# Patient Record
Sex: Female | Born: 1945
Health system: Southern US, Community
[De-identification: ages and names within clinical notes are randomized; demographics above are authoritative.]

## PROBLEM LIST (undated history)

## (undated) DIAGNOSIS — F329 Major depressive disorder, single episode, unspecified: Secondary | ICD-10-CM

## (undated) DIAGNOSIS — F32A Depression, unspecified: Secondary | ICD-10-CM

## (undated) DIAGNOSIS — F419 Anxiety disorder, unspecified: Secondary | ICD-10-CM

## (undated) HISTORY — DX: Anxiety disorder, unspecified: F41.9

## (undated) HISTORY — DX: Depression, unspecified: F32.A

## (undated) HISTORY — DX: Major depressive disorder, single episode, unspecified: F32.9

---

## 1971-09-15 HISTORY — PX: ABDOMINAL HYSTERECTOMY: SHX81

## 2005-08-01 ENCOUNTER — Inpatient Hospital Stay: Payer: Self-pay | Admitting: Internal Medicine

## 2005-08-04 ENCOUNTER — Other Ambulatory Visit: Payer: Self-pay

## 2005-08-19 ENCOUNTER — Ambulatory Visit: Payer: Self-pay | Admitting: Internal Medicine

## 2005-10-16 ENCOUNTER — Ambulatory Visit: Payer: Self-pay | Admitting: Internal Medicine

## 2006-03-03 LAB — HM DEXA SCAN

## 2007-09-21 ENCOUNTER — Ambulatory Visit: Payer: Self-pay | Admitting: Family Medicine

## 2008-03-22 ENCOUNTER — Ambulatory Visit: Payer: Self-pay | Admitting: Family Medicine

## 2009-09-07 IMAGING — CR DG CHEST 2V
1 series · 2 of 2 positions shown · non-contrast
Comparison: none

REASON FOR EXAM: PNEUMONIA
COMMENTS:

PROCEDURE:     KDR - KDXR CHEST PA (OR AP) AND LAT  - September 21, 2007 [DATE]
RESULT:     The lung fields are clear. The heart, mediastinal and osseous
structures show no acute changes.

[Series 1: view not recorded · 0.17mm/px · 2 of 2 slices shown]
[im 1/2]
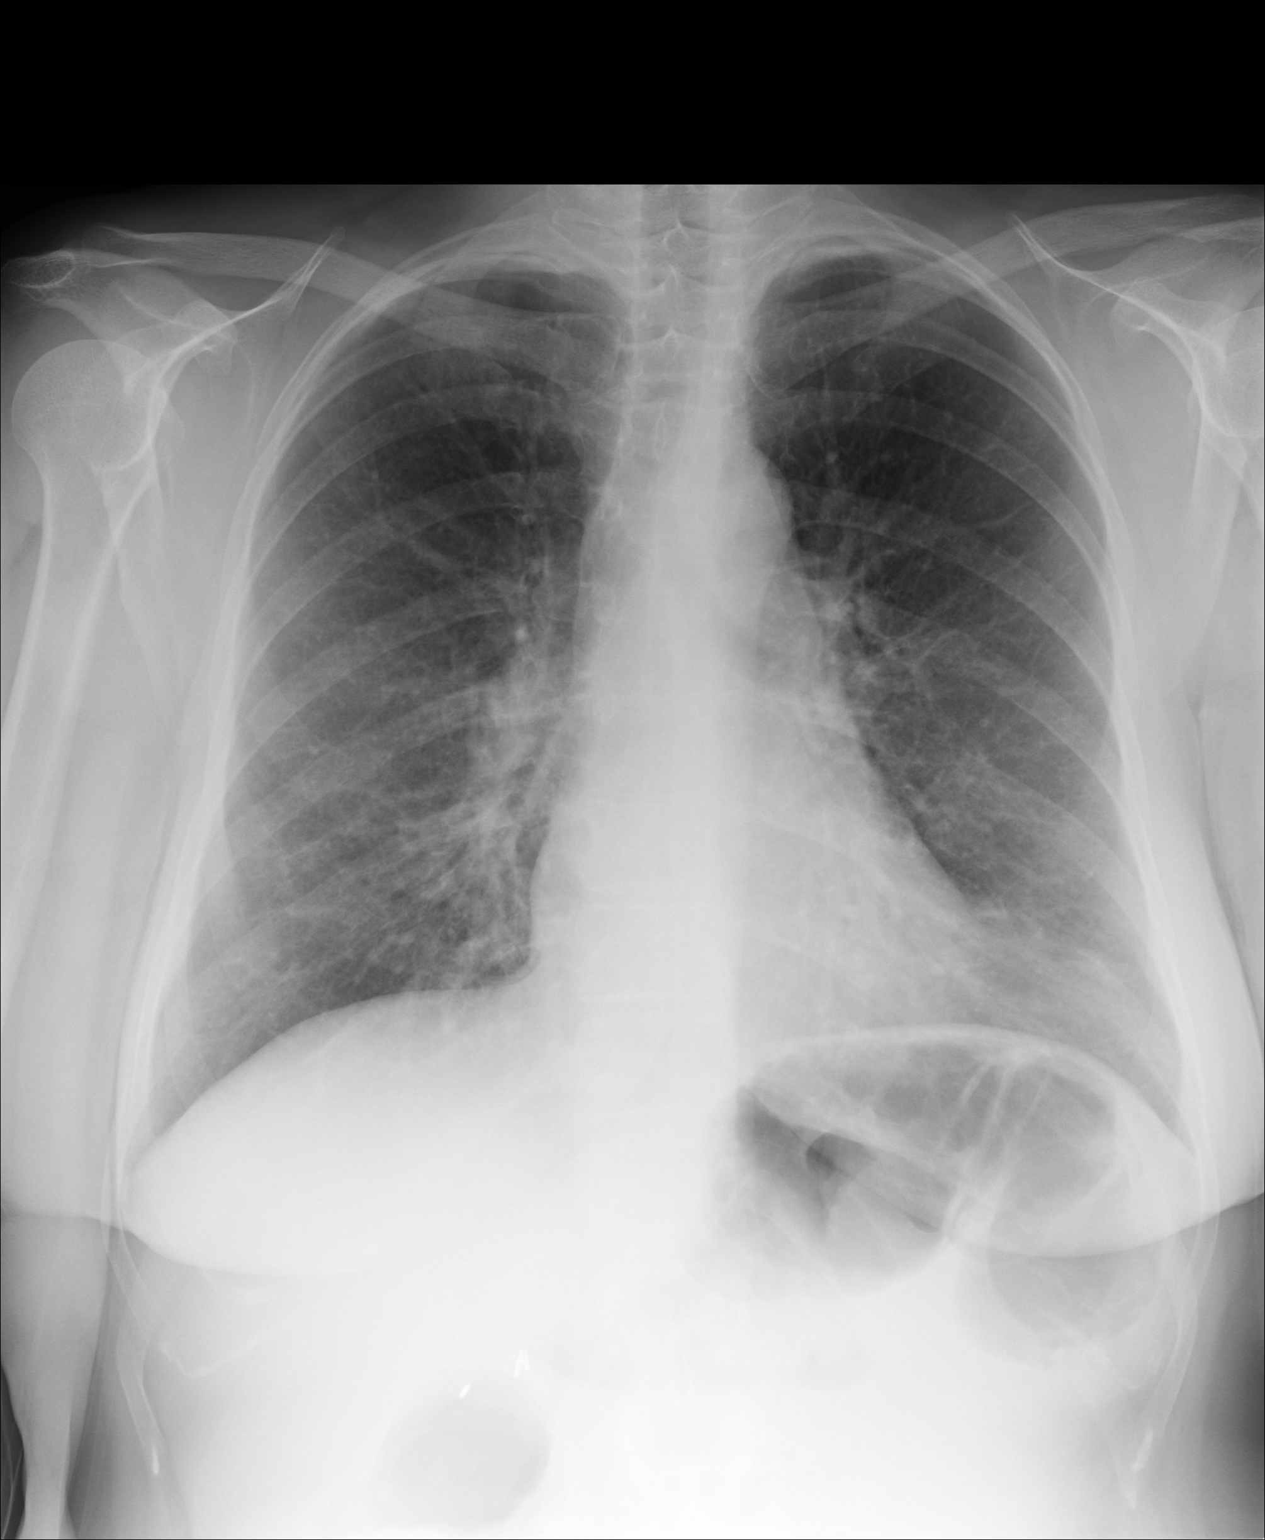
[im 2/2]
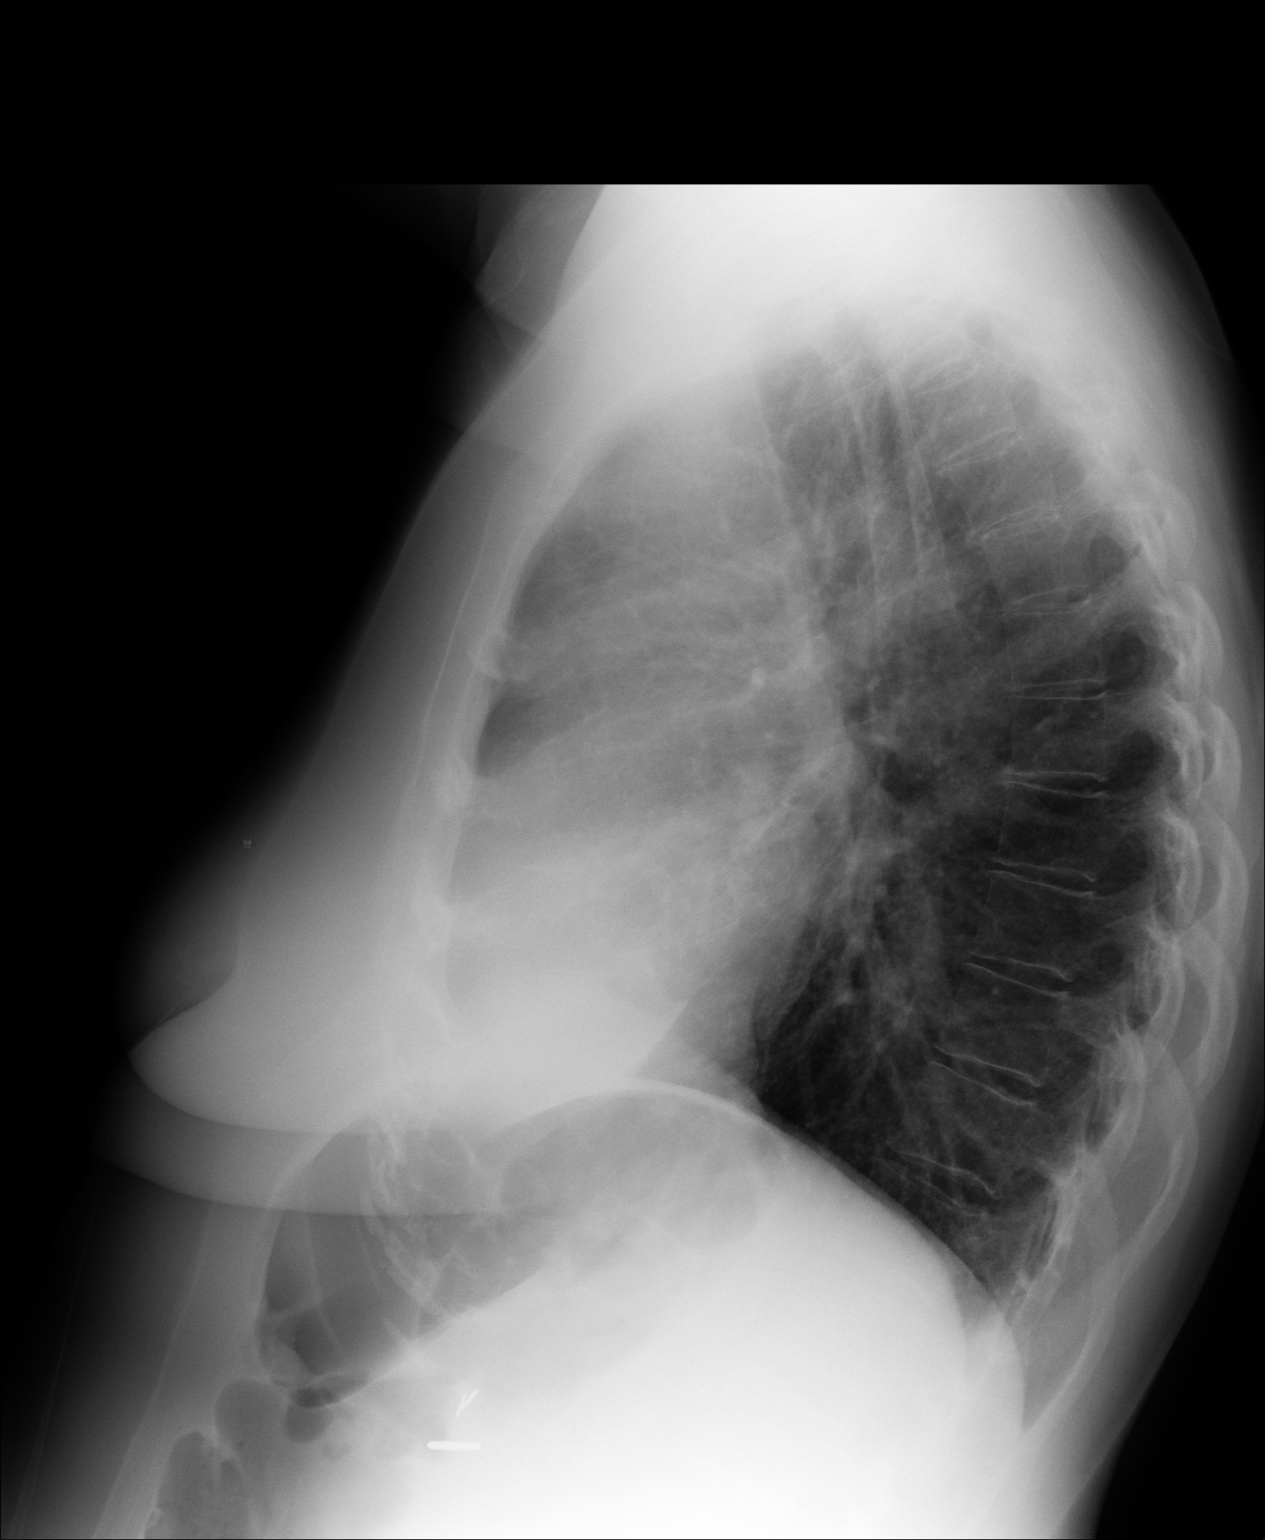

[2 of 2 positions shown; findings below may reference images not displayed]

IMPRESSION: No acute changes are identified.

## 2010-03-04 ENCOUNTER — Ambulatory Visit: Payer: Self-pay | Admitting: Family Medicine

## 2010-03-12 ENCOUNTER — Ambulatory Visit: Payer: Self-pay | Admitting: Family Medicine

## 2011-03-11 ENCOUNTER — Ambulatory Visit: Payer: Self-pay | Admitting: Family Medicine

## 2011-04-14 ENCOUNTER — Ambulatory Visit: Payer: Self-pay | Admitting: Family Medicine

## 2011-04-14 LAB — HM MAMMOGRAPHY

## 2011-06-25 LAB — LIPID PANEL
CHOLESTEROL: 145 mg/dL (ref 0–200)
HDL: 40 mg/dL (ref 35–70)
LDL CALC: 63 mg/dL
TRIGLYCERIDES: 212 mg/dL — AB (ref 40–160)

## 2011-06-25 LAB — HEMOGLOBIN A1C: Hemoglobin A1C: 5.8

## 2013-02-17 ENCOUNTER — Emergency Department: Payer: Self-pay | Admitting: Emergency Medicine

## 2013-02-25 IMAGING — CR DG CHEST 2V
1 series · 2 of 2 positions shown · non-contrast
Comparison: none

REASON FOR EXAM: cough
COMMENTS:

PROCEDURE:     KDR - KDXR CHEST PA (OR AP) AND LAT  - March 11, 2011  [DATE]
RESULT:     Comparison is made to the prior exam of 03/22/2008. The lung
fields are clear. No pneumonia, pneumothorax or pleural effusion is seen.
Heart size is normal. No significant osseous abnormalities are noted.

[Series 1: view not recorded · 0.17mm/px · 2 of 2 slices shown]
[im 1/2]
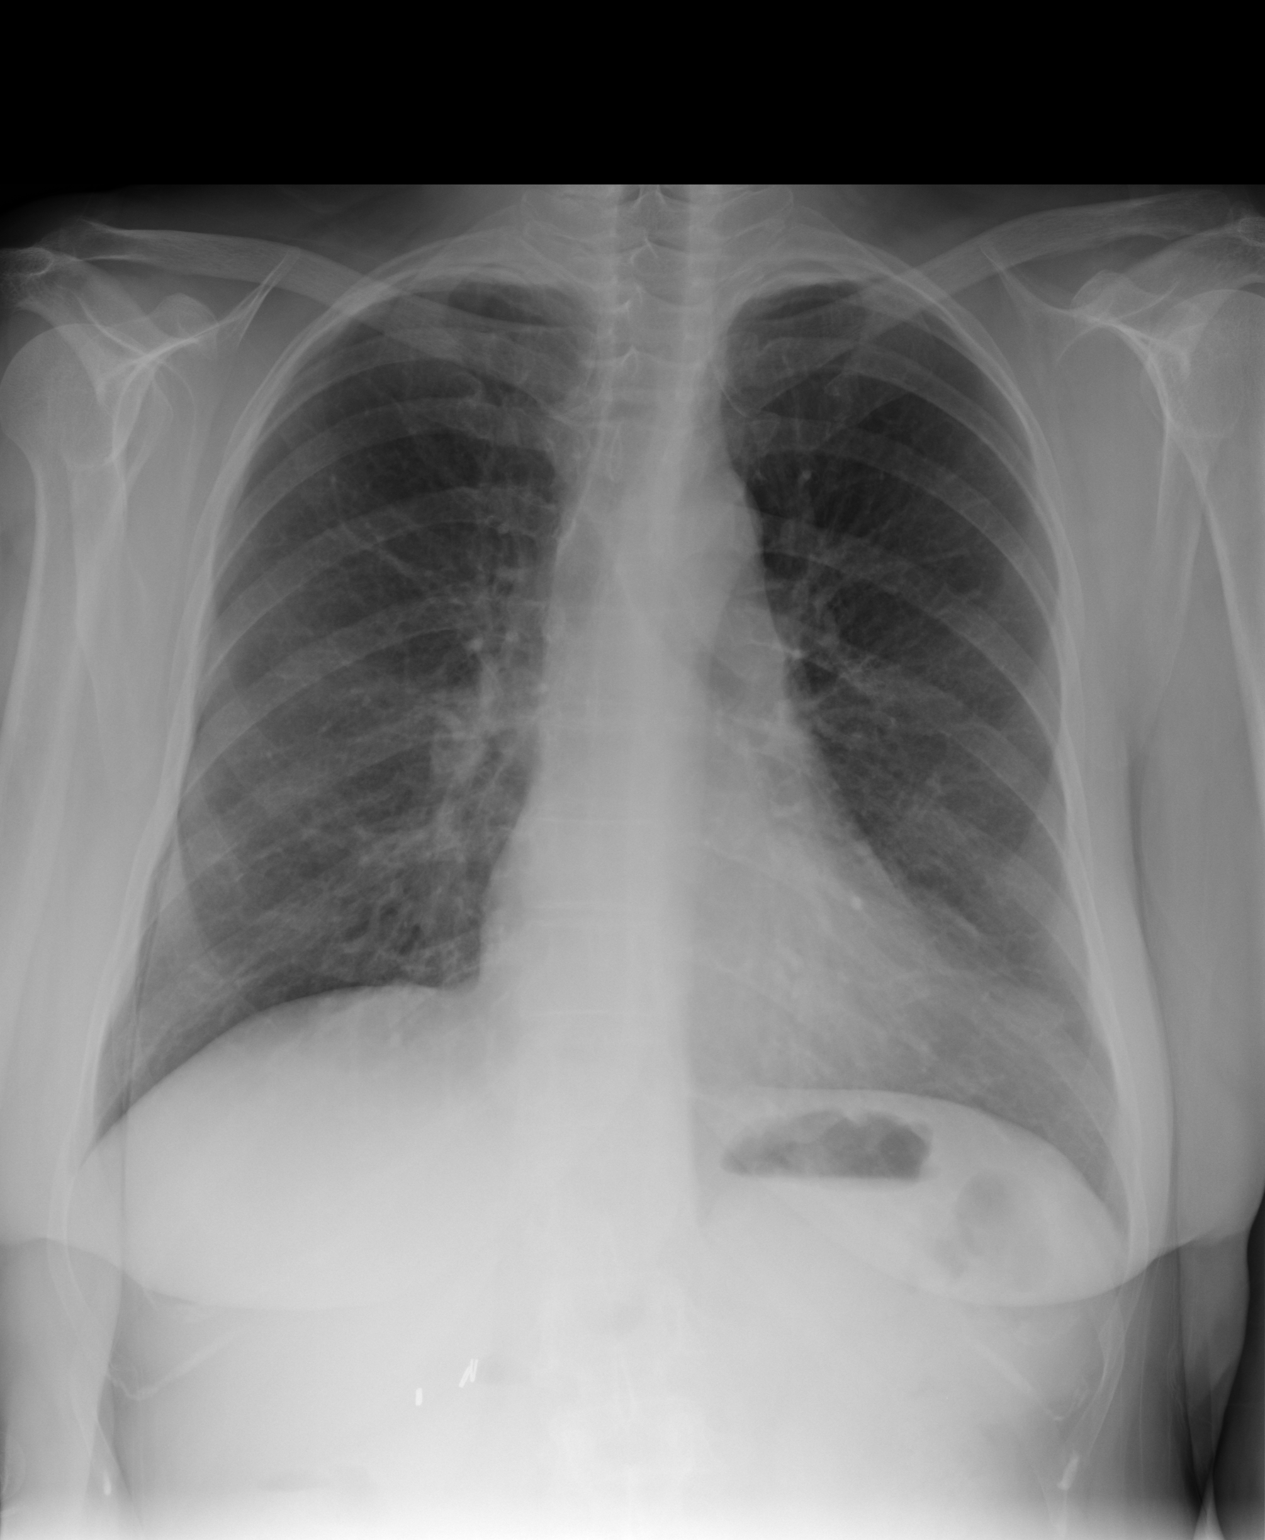
[im 2/2]
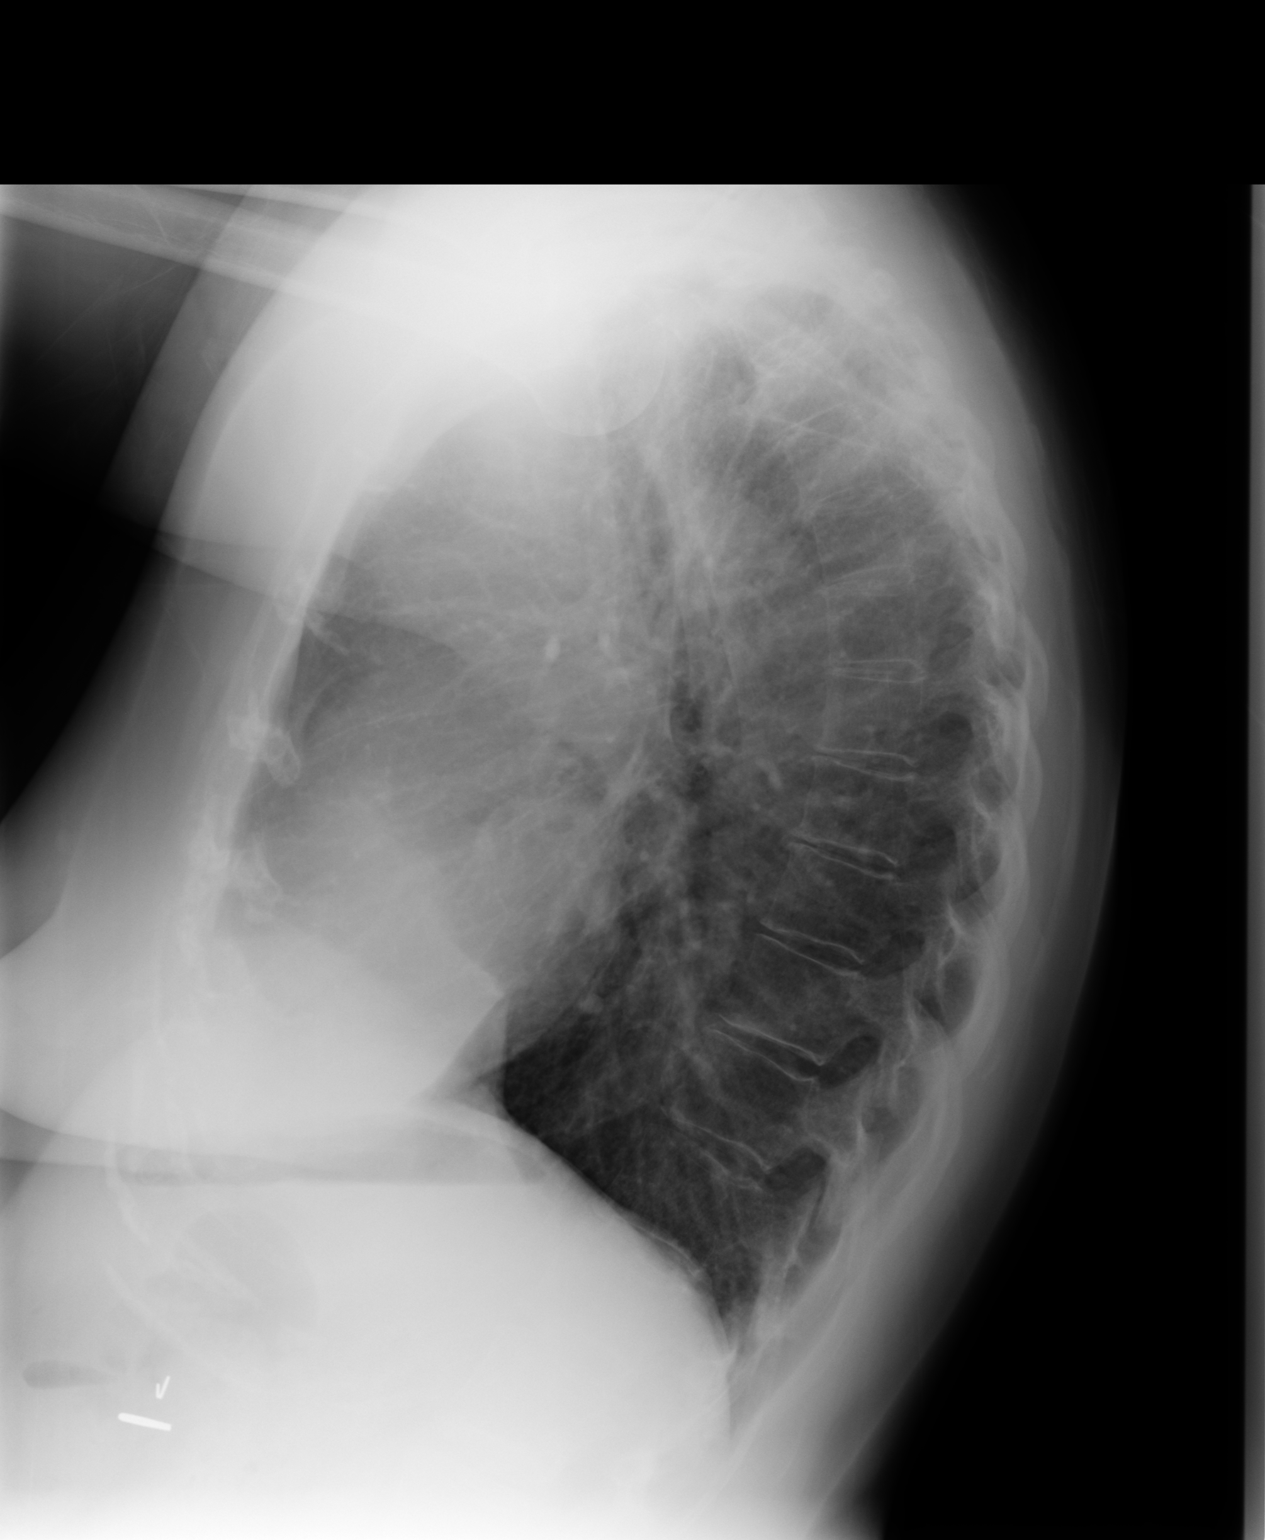

[2 of 2 positions shown; findings below may reference images not displayed]

IMPRESSION: 1.     No significant abnormalities are identified.

## 2013-03-31 ENCOUNTER — Ambulatory Visit: Payer: Self-pay

## 2014-09-11 LAB — BASIC METABOLIC PANEL
BUN: 15 mg/dL (ref 4–21)
CREATININE: 0.7 mg/dL (ref 0.5–1.1)
Glucose: 89 mg/dL
Potassium: 4.8 mmol/L (ref 3.4–5.3)
SODIUM: 141 mmol/L (ref 137–147)

## 2014-09-11 LAB — HEPATIC FUNCTION PANEL
ALT: 22 U/L (ref 7–35)
AST: 18 U/L (ref 13–35)

## 2014-10-17 DIAGNOSIS — R413 Other amnesia: Secondary | ICD-10-CM | POA: Diagnosis not present

## 2014-10-17 DIAGNOSIS — M1990 Unspecified osteoarthritis, unspecified site: Secondary | ICD-10-CM | POA: Diagnosis not present

## 2014-10-17 DIAGNOSIS — Z23 Encounter for immunization: Secondary | ICD-10-CM | POA: Diagnosis not present

## 2014-10-17 DIAGNOSIS — D649 Anemia, unspecified: Secondary | ICD-10-CM | POA: Diagnosis not present

## 2014-10-17 LAB — CBC AND DIFFERENTIAL
HEMATOCRIT: 35 % — AB (ref 36–46)
HEMOGLOBIN: 11 g/dL — AB (ref 12.0–16.0)
Platelets: 370 10*3/uL (ref 150–399)
WBC: 5.6 10*3/mL

## 2014-10-17 LAB — TSH: TSH: 1.37 u[IU]/mL (ref 0.41–5.90)

## 2015-03-25 ENCOUNTER — Other Ambulatory Visit: Payer: Self-pay | Admitting: Family Medicine

## 2015-03-25 DIAGNOSIS — F329 Major depressive disorder, single episode, unspecified: Secondary | ICD-10-CM | POA: Insufficient documentation

## 2015-03-25 DIAGNOSIS — F32A Depression, unspecified: Secondary | ICD-10-CM | POA: Insufficient documentation

## 2015-03-25 NOTE — Telephone Encounter (Signed)
Last OV 10/2014  Thanks,   -Molly Leonard  

## 2015-04-22 ENCOUNTER — Other Ambulatory Visit: Payer: Self-pay | Admitting: Family Medicine

## 2015-04-22 DIAGNOSIS — F329 Major depressive disorder, single episode, unspecified: Secondary | ICD-10-CM

## 2015-04-22 DIAGNOSIS — F32A Depression, unspecified: Secondary | ICD-10-CM

## 2015-04-22 NOTE — Telephone Encounter (Signed)
Last OV 10/2014 

## 2015-05-20 ENCOUNTER — Other Ambulatory Visit: Payer: Self-pay | Admitting: Family Medicine

## 2015-05-20 DIAGNOSIS — F419 Anxiety disorder, unspecified: Secondary | ICD-10-CM

## 2015-05-20 DIAGNOSIS — F329 Major depressive disorder, single episode, unspecified: Secondary | ICD-10-CM

## 2015-05-20 DIAGNOSIS — F32A Depression, unspecified: Secondary | ICD-10-CM

## 2015-05-21 DIAGNOSIS — F419 Anxiety disorder, unspecified: Secondary | ICD-10-CM | POA: Insufficient documentation

## 2015-05-21 NOTE — Telephone Encounter (Signed)
Ok to call in rx.  Thanks.  

## 2015-05-21 NOTE — Telephone Encounter (Signed)
Last OV 10/2014 

## 2015-06-20 DIAGNOSIS — H2511 Age-related nuclear cataract, right eye: Secondary | ICD-10-CM | POA: Diagnosis not present

## 2015-07-22 DIAGNOSIS — H25811 Combined forms of age-related cataract, right eye: Secondary | ICD-10-CM | POA: Diagnosis not present

## 2015-07-22 DIAGNOSIS — Z9841 Cataract extraction status, right eye: Secondary | ICD-10-CM | POA: Diagnosis not present

## 2015-07-22 DIAGNOSIS — H2511 Age-related nuclear cataract, right eye: Secondary | ICD-10-CM | POA: Diagnosis not present

## 2015-07-23 DIAGNOSIS — H2512 Age-related nuclear cataract, left eye: Secondary | ICD-10-CM | POA: Diagnosis not present

## 2015-08-12 DIAGNOSIS — H52223 Regular astigmatism, bilateral: Secondary | ICD-10-CM | POA: Diagnosis not present

## 2015-08-12 DIAGNOSIS — H2512 Age-related nuclear cataract, left eye: Secondary | ICD-10-CM | POA: Diagnosis not present

## 2015-08-12 DIAGNOSIS — T1512XD Foreign body in conjunctival sac, left eye, subsequent encounter: Secondary | ICD-10-CM | POA: Diagnosis not present

## 2015-08-12 DIAGNOSIS — S0502XA Injury of conjunctiva and corneal abrasion without foreign body, left eye, initial encounter: Secondary | ICD-10-CM | POA: Diagnosis not present

## 2015-08-12 DIAGNOSIS — H25812 Combined forms of age-related cataract, left eye: Secondary | ICD-10-CM | POA: Diagnosis not present

## 2015-09-12 ENCOUNTER — Other Ambulatory Visit: Payer: Self-pay | Admitting: Family Medicine

## 2015-09-12 DIAGNOSIS — F419 Anxiety disorder, unspecified: Secondary | ICD-10-CM

## 2015-09-12 NOTE — Telephone Encounter (Signed)
Printed, please fax or call in to pharmacy. Thank you.   

## 2015-09-12 NOTE — Telephone Encounter (Signed)
Needs OV. LOV was in February 2016. Allene DillonEmily Drozdowski, CMA

## 2015-10-10 ENCOUNTER — Other Ambulatory Visit: Payer: Self-pay | Admitting: Family Medicine

## 2015-10-10 DIAGNOSIS — F419 Anxiety disorder, unspecified: Secondary | ICD-10-CM

## 2015-10-10 NOTE — Telephone Encounter (Signed)
10/17/14. sd

## 2015-10-11 DIAGNOSIS — Z961 Presence of intraocular lens: Secondary | ICD-10-CM | POA: Diagnosis not present

## 2015-10-22 ENCOUNTER — Other Ambulatory Visit: Payer: Self-pay

## 2015-10-22 DIAGNOSIS — F3289 Other specified depressive episodes: Secondary | ICD-10-CM | POA: Insufficient documentation

## 2015-10-22 DIAGNOSIS — M1990 Unspecified osteoarthritis, unspecified site: Secondary | ICD-10-CM | POA: Insufficient documentation

## 2015-10-22 DIAGNOSIS — G4700 Insomnia, unspecified: Secondary | ICD-10-CM | POA: Insufficient documentation

## 2015-10-22 DIAGNOSIS — J449 Chronic obstructive pulmonary disease, unspecified: Secondary | ICD-10-CM | POA: Insufficient documentation

## 2015-10-22 DIAGNOSIS — J45909 Unspecified asthma, uncomplicated: Secondary | ICD-10-CM | POA: Insufficient documentation

## 2015-10-22 DIAGNOSIS — F410 Panic disorder [episodic paroxysmal anxiety] without agoraphobia: Secondary | ICD-10-CM | POA: Insufficient documentation

## 2015-10-22 DIAGNOSIS — Z862 Personal history of diseases of the blood and blood-forming organs and certain disorders involving the immune mechanism: Secondary | ICD-10-CM | POA: Insufficient documentation

## 2015-10-22 DIAGNOSIS — J309 Allergic rhinitis, unspecified: Secondary | ICD-10-CM | POA: Insufficient documentation

## 2015-10-22 DIAGNOSIS — Z720 Tobacco use: Secondary | ICD-10-CM | POA: Insufficient documentation

## 2015-10-22 DIAGNOSIS — D649 Anemia, unspecified: Secondary | ICD-10-CM | POA: Insufficient documentation

## 2015-10-22 DIAGNOSIS — K2100 Gastro-esophageal reflux disease with esophagitis, without bleeding: Secondary | ICD-10-CM | POA: Insufficient documentation

## 2015-10-22 DIAGNOSIS — E7800 Pure hypercholesterolemia, unspecified: Secondary | ICD-10-CM | POA: Insufficient documentation

## 2015-10-23 ENCOUNTER — Ambulatory Visit (INDEPENDENT_AMBULATORY_CARE_PROVIDER_SITE_OTHER): Payer: Medicare Other | Admitting: Family Medicine

## 2015-10-23 ENCOUNTER — Encounter: Payer: Self-pay | Admitting: Family Medicine

## 2015-10-23 VITALS — BP 116/62 | HR 88 | Temp 98.9°F | Resp 16 | Ht 67.0 in | Wt 131.0 lb

## 2015-10-23 DIAGNOSIS — F329 Major depressive disorder, single episode, unspecified: Secondary | ICD-10-CM

## 2015-10-23 DIAGNOSIS — E7800 Pure hypercholesterolemia, unspecified: Secondary | ICD-10-CM

## 2015-10-23 DIAGNOSIS — Z1239 Encounter for other screening for malignant neoplasm of breast: Secondary | ICD-10-CM

## 2015-10-23 DIAGNOSIS — D508 Other iron deficiency anemias: Secondary | ICD-10-CM | POA: Diagnosis not present

## 2015-10-23 DIAGNOSIS — Z720 Tobacco use: Secondary | ICD-10-CM | POA: Diagnosis not present

## 2015-10-23 DIAGNOSIS — F32A Depression, unspecified: Secondary | ICD-10-CM

## 2015-10-23 DIAGNOSIS — Z0000 Encounter for general adult medical examination without abnormal findings: Secondary | ICD-10-CM | POA: Diagnosis not present

## 2015-10-23 MED ORDER — BUPROPION HCL 100 MG PO TABS: 100.0000 mg | ORAL_TABLET | Freq: Every day | ORAL | Status: DC

## 2015-10-23 MED ORDER — DULOXETINE HCL 60 MG PO CPEP: 60.0000 mg | ORAL_CAPSULE | Freq: Every day | ORAL | Status: DC

## 2015-10-23 MED ORDER — BUPROPION HCL 100 MG PO TABS: 100.0000 mg | ORAL_TABLET | Freq: Two times a day (BID) | ORAL | Status: DC

## 2015-10-23 NOTE — Progress Notes (Signed)
Patient ID: Molly Leonard, female   DOB: 01/02/1946, 70 y.o.   MRN: [id number]       Patient: Molly Leonard, Female    DOB: 09-15-1945, 70 y.o.   MRN: [id number] Visit Date: 10/23/2015  Today's Provider: Lorie Phenix, MD   Chief Complaint  Patient presents with  . Medicare Wellness   Subjective:    Annual wellness visit Molly Leonard is a 70 y.o. female. She feels well. She reports exercising active with daily activities, and takes care of her grandchildren 58 y/o F and 64 y/o M.  Her daughter died last year and she is now raising her kids.   She reports she is sleeping well.  10/06/13 CPE 04/14/11 Mammo-BI-RADS 2 03/03/06 BMD-osteopenia  Lab Results  Component Value Date   WBC 5.6 10/17/2014   HGB 11.0* 10/17/2014   HCT 35* 10/17/2014   PLT 370 10/17/2014   CHOL 145 06/25/2011   TRIG 212* 06/25/2011   HDL 40 06/25/2011   LDLCALC 63 06/25/2011   ALT 22 09/11/2014   AST 18 09/11/2014   NA 141 09/11/2014   K 4.8 09/11/2014   CREATININE 0.7 09/11/2014   BUN 15 09/11/2014   TSH 1.37 10/17/2014   HGBA1C 5.8 06/25/2011    -----------------------------------------------------------   Review of Systems  Constitutional: Negative.   HENT: Negative.   Eyes: Negative.   Respiratory: Negative.   Cardiovascular: Negative.   Gastrointestinal: Negative.   Endocrine: Negative.   Genitourinary: Negative.   Musculoskeletal: Negative.   Skin: Negative.   Allergic/Immunologic: Negative.   Neurological: Negative.   Hematological: Negative.   Psychiatric/Behavioral: Negative.     Social History   Social History  . Marital Status: Married    Spouse Name: N/A  . Number of Children: N/A  . Years of Education: N/A   Occupational History  . Not on file.   Social History Main Topics  . Smoking status: Current Every Day Smoker  . Smokeless tobacco: Never Used  . Alcohol Use: No  . Drug Use: No  . Sexual Activity: Not on file   Other Topics Concern  . Not on file   Social  History Narrative    Past Medical History  Diagnosis Date  . Anxiety   . Depression      Patient Active Problem List   Diagnosis Date Noted  . Allergic rhinitis 10/22/2015  . Absolute anemia 10/22/2015  . Airway hyperreactivity 10/22/2015  . 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 Chronic obstructive pulmonary disease (HCC) 10/22/2015  . Esophagitis, reflux 10/22/2015  . Hypercholesterolemia 10/22/2015  . Cannot sleep 10/22/2015  . Arthritis, degenerative 10/22/2015  . Episodic paroxysmal anxiety disorder 10/22/2015  . Current tobacco use 10/22/2015  . Anxiety 05/21/2015  . Depression 03/25/2015    Past Surgical History  Procedure Laterality Date  . Abdominal hysterectomy  1973    Her family history includes Bone cancer in her brother; Diabetes in her mother; Heart disease in her mother; Multiple sclerosis in her sister.    Previous Medications   ALBUTEROL (PROAIR HFA) 108 (90 BASE) MCG/ACT INHALER    Inhale into the lungs.   ALPRAZOLAM (XANAX) 1 MG TABLET    TAKE ONE TABLET 3 TIMES DAILY   BUDESONIDE-FORMOTEROL (SYMBICORT) 160-4.5 MCG/ACT INHALER    Inhale 2 puffs into the lungs.    BUPROPION (WELLBUTRIN) 100 MG TABLET    TAKE ONE TABLET BY MOUTH TWICE DAILY   DULOXETINE (CYMBALTA) 60 MG CAPSULE    TAKE 1  CAPSULE EVERY DAY    Patient Care Team: Lorie Phenix, MD as PCP - General (Family Medicine)     Objective:   Vitals: BP 116/62 mmHg  Pulse 88  Temp(Src) 98.9 F (37.2 C) (Oral)  Resp 16  Ht 5\' 7"  (1.702 m)  Wt 131 lb (59.421 kg)  BMI 20.51 kg/m2  SpO2 96%  Physical Exam  Constitutional: She is oriented to person, place, and time. She appears well-developed and well-nourished.  HENT:  Head: Normocephalic and atraumatic.  Right Ear: Tympanic membrane, external ear and ear canal normal.  Left Ear: Tympanic membrane, external ear and ear canal normal.  Nose: Nose normal.  Mouth/Throat: Uvula is midline, oropharynx is clear and moist and mucous  membranes are normal.  Eyes: Conjunctivae, EOM and lids are normal. Pupils are equal, round, and reactive to light.  Neck: Trachea normal and normal range of motion. Neck supple. Carotid bruit is not present. No thyroid mass and no thyromegaly present.  Cardiovascular: Normal rate, regular rhythm and normal heart sounds.   Pulmonary/Chest: Effort normal and breath sounds normal.  Abdominal: Soft. Normal appearance and bowel sounds are normal. There is no hepatosplenomegaly. There is no tenderness.  Genitourinary: No breast swelling, tenderness or discharge.  Musculoskeletal: Normal range of motion.  Lymphadenopathy:    She has no cervical adenopathy.    She has no axillary adenopathy.  Neurological: She is alert and oriented to person, place, and time. She has normal strength. No cranial nerve deficit.  Skin: Skin is warm, dry and intact.  Psychiatric: She has a normal mood and affect. Her speech is normal and behavior is normal. Judgment and thought content normal. Cognition and memory are normal.    Activities of Daily Living In your present state of health, do you have any difficulty performing the following activities: 10/23/2015  Hearing? N  Vision? N  Difficulty concentrating or making decisions? N  Walking or climbing stairs? N  Dressing or bathing? N  Doing errands, shopping? N    Fall Risk Assessment Fall Risk  10/23/2015  Falls in the past year? No     Depression Screen PHQ 2/9 Scores 10/23/2015  PHQ - 2 Score 0    Cognitive Testing - 6-CIT  Correct? Score   What year is it? yes 0 0 or 4  What month is it? yes 0 0 or 3  Memorize:    [place], [place],  43,  [address]  [place],      What time is it? (within 1 hour) yes 0 0 or 3  Count backwards from 20 yes 0 0, 2, or 4  Name the months of the year yes 0 0, 2, or 4  Repeat name & address above yes 3 0, 2, 4, 6, 8, or 10       TOTAL SCORE  3/28   Interpretation:  Normal  Normal (0-7) Abnormal (8-28)     Assessment &  Plan:     Annual Wellness Visit  Reviewed patient's Family Medical History Reviewed and updated list of patient's medical providers Assessment of cognitive impairment was done Assessed patient's functional ability Established a written schedule for health screening services Health Risk Assessent Completed and Reviewed  Exercise Activities and Dietary recommendations Goals    None      Immunization History  Administered Date(s) Administered  . Influenza, High Dose Seasonal PF 07/12/2015  . Pneumococcal Conjugate-13 10/06/2013  . Pneumococcal Polysaccharide-23 10/17/2014  . Tdap 06/17/2011  1. Medicare annual wellness visit, subsequent Stable. Patient advised to continue eating healthy and exercise daily.  2. Breast cancer screening - MM DIGITAL SCREENING BILATERAL; Future  3. Hypercholesterolemia - Comprehensive metabolic panel - Lipid Panel With LDL/HDL Ratio - TSH  4. Other iron deficiency anemias - CBC with Differential/Platelet  5. Current tobacco use  6. Depression Stable. Continue current medication.   Is only taking one Wellbutrin. Is also having some sexual dysfunction. Will assess in 6 weeks.  Under a lot of stress. Raising her grandkids as her daughter just died.   - DULoxetine (CYMBALTA) 60 MG capsule; Take 1 capsule (60 mg total) by mouth daily.  Dispense: 90 capsule; Refill: 3 - buPROPion (WELLBUTRIN) 100 MG tablet; Take 1 tablet (100 mg total) by mouth 2 (two) times daily.  Dispense: 180 tablet; Refill: 5     Patient seen and examined by Dr. Leo Grosser, and note scribed by Liz Beach. Dimas, CMA.  I have reviewed the document for accuracy and completeness and I agree with above. Leo Grosser, MD   Lorie Phenix, MD    ------------------------------------------------------------------------------------------------------------

## 2015-10-28 ENCOUNTER — Encounter: Payer: Self-pay | Admitting: Family Medicine

## 2015-12-04 ENCOUNTER — Ambulatory Visit (INDEPENDENT_AMBULATORY_CARE_PROVIDER_SITE_OTHER): Payer: Medicare Other | Admitting: Family Medicine

## 2015-12-04 ENCOUNTER — Encounter: Payer: Self-pay | Admitting: Family Medicine

## 2015-12-04 VITALS — BP 120/62 | HR 92 | Temp 97.7°F | Resp 16 | Wt 125.0 lb

## 2015-12-04 DIAGNOSIS — F419 Anxiety disorder, unspecified: Secondary | ICD-10-CM | POA: Diagnosis not present

## 2015-12-04 DIAGNOSIS — R05 Cough: Secondary | ICD-10-CM

## 2015-12-04 DIAGNOSIS — Z720 Tobacco use: Secondary | ICD-10-CM | POA: Diagnosis not present

## 2015-12-04 DIAGNOSIS — D508 Other iron deficiency anemias: Secondary | ICD-10-CM

## 2015-12-04 DIAGNOSIS — R6882 Decreased libido: Secondary | ICD-10-CM

## 2015-12-04 DIAGNOSIS — R634 Abnormal weight loss: Secondary | ICD-10-CM | POA: Diagnosis not present

## 2015-12-04 DIAGNOSIS — E7800 Pure hypercholesterolemia, unspecified: Secondary | ICD-10-CM | POA: Diagnosis not present

## 2015-12-04 DIAGNOSIS — N941 Unspecified dyspareunia: Secondary | ICD-10-CM | POA: Diagnosis not present

## 2015-12-04 DIAGNOSIS — R059 Cough, unspecified: Secondary | ICD-10-CM

## 2015-12-04 MED ORDER — ALPRAZOLAM 1 MG PO TABS: 1.0000 mg | ORAL_TABLET | Freq: Three times a day (TID) | ORAL | Status: DC

## 2015-12-04 NOTE — Progress Notes (Signed)
Subjective:    Patient ID: Molly Leonard, female    DOB: 07/27/1946, 70 y.o.   MRN: [id number]  Depression      The problem is unchanged.  Associated symptoms include decreased concentration ("because I'm getting old") and indigestion (sometimes).  Associated symptoms include no fatigue, no helplessness, no hopelessness, does not have insomnia, not irritable, no restlessness, no decreased interest, no appetite change, no body aches, no myalgias, no headaches, not sad and no suicidal ideas.     The symptoms are aggravated by family issues.  Treatments tried: Currently taking Duloxetine 60 mg, Wellbutrin 100 mg once daily.  Compliance with treatment is good.  Previous treatment provided moderate relief.  Decreased Libido Pt states her sex drive is "horrible". No improvement from last OV. Has pain with intercourse.     Review of Systems  Constitutional: Positive for unexpected weight change (lost 6 lbs since LOV ). Negative for appetite change and fatigue.  Respiratory: Positive for cough (intermittent productive cough with brown sputum) and shortness of breath (on exertion). Negative for wheezing.   Cardiovascular: Positive for palpitations (sometimes, not new or worsening problem). Negative for chest pain and leg swelling.  Musculoskeletal: Negative for myalgias.  Neurological: Negative for headaches.  Psychiatric/Behavioral: Positive for depression and decreased concentration ("because I'm getting old"). Negative for suicidal ideas. The patient is nervous/anxious. The patient does not have insomnia.    BP 120/62 mmHg  Pulse 92  Temp(Src) 97.7 F (36.5 C) (Oral)  Resp 16  Wt 125 lb (56.7 kg)  SpO2 98%   Patient Active Problem List   Diagnosis Date Noted  . Allergic rhinitis 10/22/2015  . Absolute anemia 10/22/2015  . Airway hyperreactivity 10/22/2015  . 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 Chronic obstructive pulmonary disease (HCC) 10/22/2015  . Esophagitis, reflux  10/22/2015  . Hypercholesterolemia 10/22/2015  . Cannot sleep 10/22/2015  . Arthritis, degenerative 10/22/2015  . Episodic paroxysmal anxiety disorder 10/22/2015  . Current tobacco use 10/22/2015  . Anxiety 05/21/2015  . Depression 03/25/2015   Past Medical History  Diagnosis Date  . Anxiety   . Depression    Current Outpatient Prescriptions on File Prior to Visit  Medication Sig  . albuterol (PROAIR HFA) 108 (90 Base) MCG/ACT inhaler Inhale into the lungs.  . ALPRAZolam (XANAX) 1 MG tablet TAKE ONE TABLET 3 TIMES DAILY  . budesonide-formoterol (SYMBICORT) 160-4.5 MCG/ACT inhaler Inhale 2 puffs into the lungs.   Marland Kitchen. buPROPion (WELLBUTRIN) 100 MG tablet Take 1 tablet (100 mg total) by mouth daily.  . DULoxetine (CYMBALTA) 60 MG capsule Take 1 capsule (60 mg total) by mouth daily.   No current facility-administered medications on file prior to visit.   No Known Allergies Past Surgical History  Procedure Laterality Date  . Abdominal hysterectomy  1973   Social History   Social History  . Marital Status: Married    Spouse Name: N/A  . Number of Children: N/A  . Years of Education: N/A   Occupational History  . Not on file.   Social History Main Topics  . Smoking status: Current Every Day Smoker -- 1.00 packs/day  . Smokeless tobacco: Never Used  . Alcohol Use: No  . Drug Use: No  . Sexual Activity: Not on file   Other Topics Concern  . Not on file   Social History Narrative   Family History  Problem Relation Age of Onset  . Heart disease Mother   . Diabetes Mother   . Multiple sclerosis  Sister   . Bone cancer Brother        Objective:   Physical Exam  Constitutional: She is oriented to person, place, and time. She appears well-developed and well-nourished. She is not irritable.  Cardiovascular: Normal rate and regular rhythm.   Pulmonary/Chest: Effort normal and breath sounds normal.  Neurological: She is alert and oriented to person, place, and time.    Psychiatric: She has a normal mood and affect. Her behavior is normal. Judgment and thought content normal.   BP 120/62 mmHg  Pulse 92  Temp(Src) 97.7 F (36.5 C) (Oral)  Resp 16  Wt 125 lb (56.7 kg)  SpO2 98%     Assessment & Plan:  1. Cough Will check CXR. Discussed low contrast CT, but patient declined.  - DG Chest 2 View; Future  2. Weight loss Concerning, but patient thinks stress related.   3. Decreased libido Will refer to Gyn for dyspareunia. May need estrogen.  Is a smoker.   - Ambulatory referral to Gynecology  4. Current tobacco use Does not want to quit at this time. Under a lot of stress.    5. Hypercholesterolemia Will check labs.   - Comprehensive metabolic panel - Lipid panel - TSH  6. Other iron deficiency anemias Check labs.  - CBC with Differential/Platelet  7. Dyspareunia in female Will refer.   - Ambulatory referral to Gynecology  8. Anxiety Will continue current medication. Patient under a lot of stress.   Will follow up with Antony Contras  In 3 months.   - ALPRAZolam (XANAX) 1 MG tablet; Take 1 tablet (1 mg total) by mouth 3 (three) times daily.  Dispense: 90 tablet; Refill: 5   Patient was seen and examined by Leo Grosser, MD, and note scribed by Rondel Baton, CMA.   I have reviewed the document for accuracy and completeness and I agree with above. Leo Grosser, MD   Lorie Phenix, MD   Lorie Phenix, MD

## 2015-12-23 DIAGNOSIS — D508 Other iron deficiency anemias: Secondary | ICD-10-CM | POA: Diagnosis not present

## 2015-12-23 DIAGNOSIS — E7800 Pure hypercholesterolemia, unspecified: Secondary | ICD-10-CM | POA: Diagnosis not present

## 2015-12-24 LAB — CBC WITH DIFFERENTIAL/PLATELET
Basophils Absolute: 0 10*3/uL (ref 0.0–0.2)
Basos: 0 %
EOS (ABSOLUTE): 0.1 10*3/uL (ref 0.0–0.4)
Eos: 1 %
Hematocrit: 35.9 % (ref 34.0–46.6)
Hemoglobin: 11.5 g/dL (ref 11.1–15.9)
IMMATURE GRANS (ABS): 0 10*3/uL (ref 0.0–0.1)
IMMATURE GRANULOCYTES: 0 %
Lymphocytes Absolute: 1.3 10*3/uL (ref 0.7–3.1)
Lymphs: 15 %
MCH: 35.2 pg — ABNORMAL HIGH (ref 26.6–33.0)
MCHC: 32 g/dL (ref 31.5–35.7)
MCV: 110 fL — ABNORMAL HIGH (ref 79–97)
MONOS ABS: 0.3 10*3/uL (ref 0.1–0.9)
Monocytes: 4 %
NEUTROS PCT: 80 %
Neutrophils Absolute: 6.8 10*3/uL (ref 1.4–7.0)
PLATELETS: 380 10*3/uL — AB (ref 150–379)
RBC: 3.27 x10E6/uL — AB (ref 3.77–5.28)
RDW: 12.9 % (ref 12.3–15.4)
WBC: 8.4 10*3/uL (ref 3.4–10.8)

## 2015-12-24 LAB — COMPREHENSIVE METABOLIC PANEL
ALT: 9 IU/L (ref 0–32)
AST: 16 IU/L (ref 0–40)
Albumin/Globulin Ratio: 1.5 (ref 1.2–2.2)
Albumin: 4.4 g/dL (ref 3.6–4.8)
Alkaline Phosphatase: 99 IU/L (ref 39–117)
BUN/Creatinine Ratio: 28 (ref 12–28)
BUN: 30 mg/dL — AB (ref 8–27)
Bilirubin Total: <0.2 mg/dL (ref 0.0–1.2)
CALCIUM: 9.4 mg/dL (ref 8.7–10.3)
CO2: 18 mmol/L (ref 18–29)
CREATININE: 1.06 mg/dL — AB (ref 0.57–1.00)
Chloride: 106 mmol/L (ref 96–106)
GFR calc Af Amer: 62 mL/min/{1.73_m2} (ref >59)
GFR, EST NON AFRICAN AMERICAN: 54 mL/min/{1.73_m2} — AB (ref >59)
GLUCOSE: 101 mg/dL — AB (ref 65–99)
Globulin, Total: 2.9 g/dL (ref 1.5–4.5)
POTASSIUM: 4.4 mmol/L (ref 3.5–5.2)
Sodium: 142 mmol/L (ref 134–144)
Total Protein: 7.3 g/dL (ref 6.0–8.5)

## 2015-12-24 LAB — LIPID PANEL
CHOL/HDL RATIO: 4.4 ratio (ref 0.0–4.4)
Cholesterol, Total: 159 mg/dL (ref 100–199)
HDL: 36 mg/dL — AB (ref >39)
LDL CALC: 82 mg/dL (ref 0–99)
TRIGLYCERIDES: 203 mg/dL — AB (ref 0–149)
VLDL CHOLESTEROL CAL: 41 mg/dL — AB (ref 5–40)

## 2015-12-24 LAB — TSH: TSH: 1.36 u[IU]/mL (ref 0.450–4.500)

## 2016-01-22 ENCOUNTER — Encounter: Payer: Self-pay | Admitting: Obstetrics and Gynecology

## 2016-02-05 ENCOUNTER — Other Ambulatory Visit: Payer: Self-pay | Admitting: Family Medicine

## 2016-02-05 DIAGNOSIS — J45909 Unspecified asthma, uncomplicated: Secondary | ICD-10-CM

## 2016-03-04 ENCOUNTER — Other Ambulatory Visit: Payer: Self-pay | Admitting: Family Medicine

## 2016-03-04 DIAGNOSIS — F32A Depression, unspecified: Secondary | ICD-10-CM

## 2016-03-04 DIAGNOSIS — F329 Major depressive disorder, single episode, unspecified: Secondary | ICD-10-CM

## 2016-03-05 ENCOUNTER — Ambulatory Visit: Payer: Self-pay | Admitting: Physician Assistant

## 2016-04-02 ENCOUNTER — Other Ambulatory Visit: Payer: Self-pay | Admitting: Family Medicine

## 2016-04-02 DIAGNOSIS — F329 Major depressive disorder, single episode, unspecified: Secondary | ICD-10-CM

## 2016-04-02 DIAGNOSIS — F32A Depression, unspecified: Secondary | ICD-10-CM

## 2016-06-25 ENCOUNTER — Other Ambulatory Visit: Payer: Self-pay

## 2016-06-25 DIAGNOSIS — F419 Anxiety disorder, unspecified: Secondary | ICD-10-CM

## 2016-06-25 MED ORDER — ALPRAZOLAM 1 MG PO TABS: 1.0000 mg | ORAL_TABLET | Freq: Three times a day (TID) | ORAL | 0 refills | Status: DC

## 2016-06-25 NOTE — Telephone Encounter (Signed)
Rx called in to pharmacy. 

## 2016-06-25 NOTE — Telephone Encounter (Signed)
Last ov 12/04/2015.

## 2016-07-30 ENCOUNTER — Encounter: Payer: Self-pay | Admitting: Physician Assistant

## 2016-07-30 ENCOUNTER — Ambulatory Visit (INDEPENDENT_AMBULATORY_CARE_PROVIDER_SITE_OTHER): Payer: Medicare Other | Admitting: Physician Assistant

## 2016-07-30 VITALS — BP 130/70 | HR 84 | Temp 97.8°F | Resp 16 | Ht 67.0 in | Wt 120.6 lb

## 2016-07-30 DIAGNOSIS — F419 Anxiety disorder, unspecified: Secondary | ICD-10-CM

## 2016-07-30 DIAGNOSIS — F331 Major depressive disorder, recurrent, moderate: Secondary | ICD-10-CM | POA: Diagnosis not present

## 2016-07-30 MED ORDER — DULOXETINE HCL 60 MG PO CPEP
60.0000 mg | ORAL_CAPSULE | Freq: Two times a day (BID) | ORAL | 0 refills | Status: DC
Start: 2016-07-30 — End: 2016-08-27

## 2016-07-30 MED ORDER — ALPRAZOLAM 1 MG PO TABS: 1.0000 mg | ORAL_TABLET | Freq: Three times a day (TID) | ORAL | 5 refills | Status: DC

## 2016-07-30 NOTE — Patient Instructions (Signed)

## 2016-07-30 NOTE — Progress Notes (Signed)
Patient: Molly Leonard Female    DOB: 09/11/1946   70 y.o.   MRN: [id number] Visit Date: 07/30/2016  Today's Provider: Margaretann LovelessJennifer M Burnette, PA-C   Chief Complaint  Patient presents with  . Discuss Medication   Subjective:    HPI Patient is here today to discuss medication. She is taking Xanax , Cymbalta and Wellbutrin.   Depression: She reports that she wants something different than Wellbutrin. She reports she cut down the dosage because it will make her feel "nervous inside". Patient states she feels very depressed,crying in office. She reports that since her daughter passed away 02/17 from cancer that she has been trying her best to keep it together but she feels like she is very depressed. She is also taking care of her daughter's children: one is 2512 and the other 69 years old. She denies suicidal or homicidal ideations.    No Known Allergies   Current Outpatient Prescriptions:  .  ALPRAZolam (XANAX) 1 MG tablet, Take 1 tablet (1 mg total) by mouth 3 (three) times daily. Please schedule an appointment before future refills, Disp: 90 tablet, Rfl: 0 .  buPROPion (WELLBUTRIN) 100 MG tablet, TAKE ONE TABLET TWICE DAILY, Disp: 180 tablet, Rfl: 1 .  DULoxetine (CYMBALTA) 60 MG capsule, Take 1 capsule (60 mg total) by mouth daily., Disp: 90 capsule, Rfl: 3 .  SYMBICORT 160-4.5 MCG/ACT inhaler, TAKE 2 PUFFS TWICE A DAY, Disp: 10.2 g, Rfl: 5 .  VENTOLIN HFA 108 (90 Base) MCG/ACT inhaler, INHALE 2 PUFFS EVERY 4 HOURS AS NEEDED FOR SHORTNESS OF BREATH, Disp: 18 g, Rfl: 5  Review of Systems  Constitutional: Negative.   Respiratory: Negative.   Cardiovascular: Negative for chest pain, palpitations and leg swelling.  Gastrointestinal: Negative.   Neurological: Negative.   Psychiatric/Behavioral: Positive for decreased concentration and dysphoric mood. Negative for sleep disturbance. The patient is nervous/anxious.     Social History  Substance Use Topics  . Smoking status: Current  Every Day Smoker    Packs/day: 1.00  . Smokeless tobacco: Never Used  . Alcohol use No   Objective:   BP 130/70 (BP Location: Left Arm, Patient Position: Sitting, Cuff Size: Normal)   Pulse 84   Temp 97.8 F (36.6 C) (Oral)   Resp 16   Ht 5\' 7"  (1.702 m)   Wt 120 lb 9.6 oz (54.7 kg)   BMI 18.89 kg/m   Physical Exam  Constitutional: She appears well-developed and well-nourished. No distress.  Neck: Normal range of motion. Neck supple. No JVD present. No tracheal deviation present. No thyromegaly present.  Cardiovascular: Normal rate, regular rhythm and normal heart sounds.  Exam reveals no gallop and no friction rub.   No murmur heard. Pulmonary/Chest: Effort normal and breath sounds normal. No respiratory distress. She has no wheezes. She has no rales.  Lymphadenopathy:    She has no cervical adenopathy.  Skin: She is not diaphoretic.  Psychiatric: Her speech is normal and behavior is normal. Judgment and thought content normal. Her mood appears anxious. Cognition and memory are normal. She exhibits a depressed mood.  Vitals reviewed.     Assessment & Plan:     1. Moderate episode of recurrent major depressive disorder (HCC) Worsening symptoms. Will discontinue Wellbutrin. Increase Cymbalta to 1 capsule twice daily. I will see her back in 4 weeks to see how she is doing with this treatment. If she still does not see any response with the increase to max dose  of Cymbalta we will discontinue and try alternative therapies. - DULoxetine (CYMBALTA) 60 MG capsule; Take 1 capsule (60 mg total) by mouth 2 (two) times daily.  Dispense: 180 capsule; Refill: 0  2. Anxiety Diagnosis pulled for medication refill. Continue current medical treatment plan. - ALPRAZolam (XANAX) 1 MG tablet; Take 1 tablet (1 mg total) by mouth 3 (three) times daily.  Dispense: 90 tablet; Refill: 5       Margaretann LovelessJennifer M Burnette, PA-C  [facility]

## 2016-08-27 ENCOUNTER — Ambulatory Visit (INDEPENDENT_AMBULATORY_CARE_PROVIDER_SITE_OTHER): Payer: Medicare Other | Admitting: Physician Assistant

## 2016-08-27 ENCOUNTER — Encounter: Payer: Self-pay | Admitting: Physician Assistant

## 2016-08-27 VITALS — BP 144/80 | HR 84 | Temp 97.8°F | Resp 16 | Wt 121.0 lb

## 2016-08-27 DIAGNOSIS — F331 Major depressive disorder, recurrent, moderate: Secondary | ICD-10-CM | POA: Diagnosis not present

## 2016-08-27 MED ORDER — CITALOPRAM HYDROBROMIDE 20 MG PO TABS: ORAL_TABLET | ORAL | 0 refills | Status: DC

## 2016-08-27 MED ORDER — DULOXETINE HCL 30 MG PO CPEP: 30.0000 mg | ORAL_CAPSULE | Freq: Every day | ORAL | 0 refills | Status: DC

## 2016-08-27 NOTE — Progress Notes (Signed)
Patient: Molly Leonard Female    DOB: 02/25/1946   70 y.o.   MRN: [id number] Visit Date: 08/27/2016  Today's Provider: Margaretann LovelessJennifer M Russie Gulledge, PA-C   Chief Complaint  Patient presents with  . Depression   Subjective:    HPI     Depression, Follow-up  She  was last seen for this 4 weeks ago. Changes made at last visit include D/C Wellbutrin and increase Cymbalta to 1 capsule BID.   She reports fair compliance with treatment. Pt is only taking once daily due to side effects. She is having side effects. Cymbalta causing shakiness, palpitations and "temper fits".  She reports fair to poor tolerance of treatment. Current symptoms include: depressed mood, difficulty concentrating, fatigue, feelings of worthlessness/guilt, hopelessness, impaired memory and suicidal thoughts without plan. Pt is also c/o anxiety. She feels she is Worse since last visit.  Pt reports she is struggling after losing her daughter to cancer 2 years ago. 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   ------------------------------------------------------------------------  Depression screen PHQ 2/9 08/27/2016  Decreased Interest 3  Down, Depressed, Hopeless 3  PHQ - 2 Score 6  Altered sleeping 0  Tired, decreased energy 3  Change in appetite 3  Feeling bad or failure about yourself  3  Trouble concentrating 0  Moving slowly or fidgety/restless 3  Suicidal thoughts 3  PHQ-9 Score 21      No Known Allergies   Current Outpatient Prescriptions:  .  ALPRAZolam (XANAX) 1 MG tablet, Take 1 tablet (1 mg total) by mouth 3 (three) times daily., Disp: 90 tablet, Rfl: 5 .  DULoxetine (CYMBALTA) 60 MG capsule, Take 1 capsule (60 mg total) by mouth 2 (two) times daily. (Patient taking differently: Take 60 mg by mouth daily. ), Disp: 180 capsule, Rfl: 0 .  SYMBICORT 160-4.5 MCG/ACT inhaler, TAKE 2 PUFFS TWICE A DAY, Disp: 10.2 g, Rfl:  5 .  VENTOLIN HFA 108 (90 Base) MCG/ACT inhaler, INHALE 2 PUFFS EVERY 4 HOURS AS NEEDED FOR SHORTNESS OF BREATH, Disp: 18 g, Rfl: 5  Review of Systems  Constitutional: Positive for activity change, appetite change (increased) and fatigue. Negative for chills, diaphoresis, fever and unexpected weight change.  Respiratory: Negative.   Cardiovascular: Positive for palpitations. Negative for chest pain.  Neurological: Negative.   Psychiatric/Behavioral: Positive for agitation, decreased concentration, dysphoric mood and suicidal ideas. The patient is nervous/anxious.     Social History  Substance Use Topics  . Smoking status: Current Every Day Smoker    Packs/day: 1.00  . Smokeless tobacco: Never Used  . Alcohol use No   Objective:   BP (!) 144/80 (BP Location: Left Arm, Patient Position: Sitting, Cuff Size: Normal)   Pulse 84   Temp 97.8 F (36.6 C) (Oral)   Resp 16   Wt 121 lb (54.9 kg)   BMI 18.95 kg/m   Physical Exam  Constitutional: She appears well-developed and well-nourished. No distress.  Neck: Normal range of motion. Neck supple. No JVD present. No tracheal deviation present. No thyromegaly present.  Cardiovascular: Normal rate, regular rhythm and normal heart sounds.  Exam reveals no gallop and no friction rub.   No murmur heard. Pulmonary/Chest: Effort normal and breath sounds normal. No respiratory distress. She has no wheezes. She has no rales.  Lymphadenopathy:    She has no cervical adenopathy.  Skin: She is not diaphoretic.  Psychiatric: Her speech is normal and behavior is normal. Judgment and thought content normal. Her mood appears anxious. Cognition and memory are normal. She exhibits a depressed mood.  Vitals reviewed.      Assessment & Plan:     1. Moderate episode of recurrent major depressive disorder (HCC) Will have her start slow discontinuation of cymbalta. Will send in 30mg  for her to take for two weeks, then discontinue and start celexa, starting  with 1/2 tab x 1 week then increasing to 1 tab. I will see her back in 6 weeks to see how she is doing and increase dose of celexa if needed. She is to call for acute issue in the meantime. - DULoxetine (CYMBALTA) 30 MG capsule; Take 1 capsule (30 mg total) by mouth daily.  Dispense: 14 capsule; Refill: 0 - citalopram (CELEXA) 20 MG tablet; Take 1/2 tab PO daily x 1 week, then increase to 1 tab PO daily  Dispense: 30 tablet; Refill: 0     Patient seen and examined by Margaretann LovelessJennifer M Josseline Reddin, PA-C, and note scribed by Allene DillonEmily Drozdowski, CMA.  Margaretann LovelessJennifer M Genevieve Ritzel, PA-C  [facility]

## 2016-08-27 NOTE — Patient Instructions (Signed)
Citalopram tablets What is this medicine? CITALOPRAM (sye TAL oh pram) is a medicine for depression. This medicine may be used for other purposes; ask your health care provider or pharmacist if you have questions. COMMON BRAND NAME(S): Celexa What should I tell my health care provider before I take this medicine? They need to know if you have any of these conditions: -bleeding disorders -bipolar disorder or a family history of bipolar disorder -glaucoma -heart disease -history of irregular heartbeat -kidney disease -liver disease -low levels of magnesium or potassium in the blood -receiving electroconvulsive therapy -seizures -suicidal thoughts, plans, or attempt; a previous suicide attempt by you or a family member -take medicines that treat or prevent blood clots -thyroid disease -an unusual or allergic reaction to citalopram, escitalopram, other medicines, foods, dyes, or preservatives -pregnant or trying to become pregnant -breast-feeding How should I use this medicine? Take this medicine by mouth with a glass of water. Follow the directions on the prescription label. You can take it with or without food. Take your medicine at regular intervals. Do not take your medicine more often than directed. Do not stop taking this medicine suddenly except upon the advice of your doctor. Stopping this medicine too quickly may cause serious side effects or your condition may worsen. A special MedGuide will be given to you by the pharmacist with each prescription and refill. Be sure to read this information carefully each time. Talk to your pediatrician regarding the use of this medicine in children. Special care may be needed. Patients over 69 years old may have a stronger reaction and need a smaller dose. Overdosage: If you think you have taken too much of this medicine contact a poison control center or emergency room at once. NOTE: This medicine is only for you. Do not share this medicine with  others. What if I miss a dose? If you miss a dose, take it as soon as you can. If it is almost time for your next dose, take only that dose. Do not take double or extra doses. What may interact with this medicine? Do not take this medicine with any of the following medications: -certain medicines for fungal infections like fluconazole, itraconazole, ketoconazole, posaconazole, voriconazole -cisapride -dofetilide -dronedarone -escitalopram -linezolid -MAOIs like Carbex, Eldepryl, Marplan, Nardil, and Parnate -methylene blue (injected into a vein) -pimozide -thioridazine -ziprasidone This medicine may also interact with the following medications: -alcohol -amphetamines -aspirin and aspirin-like medicines -carbamazepine -certain medicines for depression, anxiety, or psychotic disturbances -certain medicines for infections like chloroquine, clarithromycin, erythromycin, furazolidone, isoniazid, pentamidine -certain medicines for migraine headaches like almotriptan, eletriptan, frovatriptan, naratriptan, rizatriptan, sumatriptan, zolmitriptan -certain medicines for sleep -certain medicines that treat or prevent blood clots like dalteparin, enoxaparin, warfarin -cimetidine -diuretics -fentanyl -lithium -methadone -metoprolol -NSAIDs, medicines for pain and inflammation, like ibuprofen or naproxen -omeprazole -other medicines that prolong the QT interval (cause an abnormal heart rhythm) -procarbazine -rasagiline -supplements like St. John's wort, kava kava, valerian -tramadol -tryptophan This list may not describe all possible interactions. Give your health care provider a list of all the medicines, herbs, non-prescription drugs, or dietary supplements you use. Also tell them if you smoke, drink alcohol, or use illegal drugs. Some items may interact with your medicine. What should I watch for while using this medicine? Tell your doctor if your symptoms do not get better or if they  get worse. Visit your doctor or health care professional for regular checks on your progress. Because it may take several weeks to see the full   effects of this medicine, it is important to continue your treatment as prescribed by your doctor. Patients and their families should watch out for new or worsening thoughts of suicide or depression. Also watch out for sudden changes in feelings such as feeling anxious, agitated, panicky, irritable, hostile, aggressive, impulsive, severely restless, overly excited and hyperactive, or not being able to sleep. If this happens, especially at the beginning of treatment or after a change in dose, call your health care professional. You may get drowsy or dizzy. Do not drive, use machinery, or do anything that needs mental alertness until you know how this medicine affects you. Do not stand or sit up quickly, especially if you are an older patient. This reduces the risk of dizzy or fainting spells. Alcohol may interfere with the effect of this medicine. Avoid alcoholic drinks. Your mouth may get dry. Chewing sugarless gum or sucking hard candy, and drinking plenty of water will help. Contact your doctor if the problem does not go away or is severe. What side effects may I notice from receiving this medicine? Side effects that you should report to your doctor or health care professional as soon as possible: -allergic reactions like skin rash, itching or hives, swelling of the face, lips, or tongue -anxious -black, tarry stools -breathing problems -changes in vision -chest pain -confusion -elevated mood, decreased need for sleep, racing thoughts, impulsive behavior -eye pain -fast, irregular heartbeat -feeling faint or lightheaded, falls -feeling agitated, angry, or irritable -hallucination, loss of contact with reality -loss of balance or coordination -loss of memory -painful or prolonged erections -restlessness, pacing, inability to keep  still -seizures -stiff muscles -suicidal thoughts or other mood changes -trouble sleeping -unusual bleeding or bruising -unusually weak or tired -vomiting Side effects that usually do not require medical attention (report to your doctor or health care professional if they continue or are bothersome): -change in appetite or weight -change in sex drive or performance -dizziness -headache -increased sweating -indigestion, nausea -tremors This list may not describe all possible side effects. Call your doctor for medical advice about side effects. You may report side effects to FDA at 1-800-FDA-1088. Where should I keep my medicine? Keep out of reach of children. Store at room temperature between 15 and 30 degrees C (59 and 86 degrees F). Throw away any unused medicine after the expiration date. NOTE: This sheet is a summary. It may not cover all possible information. If you have questions about this medicine, talk to your doctor, pharmacist, or health care provider.  2017 Elsevier/Gold Standard (2016-02-03 13:18:52)  

## 2016-09-16 ENCOUNTER — Ambulatory Visit (INDEPENDENT_AMBULATORY_CARE_PROVIDER_SITE_OTHER): Payer: Medicare Other | Admitting: Physician Assistant

## 2016-09-16 ENCOUNTER — Encounter: Payer: Self-pay | Admitting: Physician Assistant

## 2016-09-16 VITALS — BP 140/68 | HR 95 | Temp 97.4°F | Resp 16 | Wt 123.0 lb

## 2016-09-16 DIAGNOSIS — R05 Cough: Secondary | ICD-10-CM | POA: Diagnosis not present

## 2016-09-16 DIAGNOSIS — R059 Cough, unspecified: Secondary | ICD-10-CM

## 2016-09-16 DIAGNOSIS — J069 Acute upper respiratory infection, unspecified: Secondary | ICD-10-CM | POA: Diagnosis not present

## 2016-09-16 MED ORDER — BENZONATATE 200 MG PO CAPS: 200.0000 mg | ORAL_CAPSULE | Freq: Three times a day (TID) | ORAL | 0 refills | Status: DC | PRN

## 2016-09-16 MED ORDER — AMOXICILLIN-POT CLAVULANATE 875-125 MG PO TABS: 1.0000 | ORAL_TABLET | Freq: Two times a day (BID) | ORAL | 0 refills | Status: DC

## 2016-09-16 NOTE — Progress Notes (Signed)
Patient: Molly Leonard Female    DOB: May 18, 1946   71 y.o.   MRN: [id number] Visit Date: 09/16/2016  Today's Provider: Margaretann Loveless, PA-C   Chief Complaint  Patient presents with  . URI   Subjective:    URI   This is a new problem. The current episode started 1 to 4 weeks ago. The problem has been gradually worsening. There has been no fever. Associated symptoms include congestion, coughing, ear pain (right ear pain), headaches, rhinorrhea and sneezing. Pertinent negatives include no abdominal pain, chest pain, nausea, plugged ear sensation, sore throat or wheezing. She has tried decongestant (Alka-Seltzer Cold and TheraFlu) for the symptoms.      No Known Allergies   Current Outpatient Prescriptions:  .  ALPRAZolam (XANAX) 1 MG tablet, Take 1 tablet (1 mg total) by mouth 3 (three) times daily., Disp: 90 tablet, Rfl: 5 .  citalopram (CELEXA) 20 MG tablet, Take 1/2 tab PO daily x 1 week, then increase to 1 tab PO daily, Disp: 30 tablet, Rfl: 0 .  DULoxetine (CYMBALTA) 30 MG capsule, Take 1 capsule (30 mg total) by mouth daily., Disp: 14 capsule, Rfl: 0 .  SYMBICORT 160-4.5 MCG/ACT inhaler, TAKE 2 PUFFS TWICE A DAY (Patient not taking: Reported on 09/16/2016), Disp: 10.2 g, Rfl: 5 .  VENTOLIN HFA 108 (90 Base) MCG/ACT inhaler, INHALE 2 PUFFS EVERY 4 HOURS AS NEEDED FOR SHORTNESS OF BREATH (Patient not taking: Reported on 09/16/2016), Disp: 18 g, Rfl: 5  Review of Systems  Constitutional: Negative.   HENT: Positive for congestion, ear pain (right ear pain), postnasal drip, rhinorrhea, sinus pressure and sneezing. Negative for sore throat and trouble swallowing.   Respiratory: Positive for cough. Negative for chest tightness, shortness of breath and wheezing.   Cardiovascular: Negative for chest pain, palpitations and leg swelling.  Gastrointestinal: Negative for abdominal pain and nausea.  Neurological: Positive for headaches. Negative for dizziness.    Social History    Substance Use Topics  . Smoking status: Current Every Day Smoker    Packs/day: 1.00  . Smokeless tobacco: Never Used  . Alcohol use No   Objective:   BP 140/68 (BP Location: Right Arm, Patient Position: Sitting, Cuff Size: Normal)   Pulse 95   Temp 97.4 F (36.3 C) (Oral)   Resp 16   Wt 123 lb (55.8 kg)   SpO2 97%   BMI 19.26 kg/m   Physical Exam  Constitutional: She appears well-developed and well-nourished. No distress.  HENT:  Head: Normocephalic and atraumatic.  Right Ear: Hearing, tympanic membrane, external ear and ear canal normal.  Left Ear: Hearing, tympanic membrane, external ear and ear canal normal.  Nose: Mucosal edema and rhinorrhea present. Right sinus exhibits no maxillary sinus tenderness and no frontal sinus tenderness. Left sinus exhibits no maxillary sinus tenderness and no frontal sinus tenderness.  Mouth/Throat: Uvula is midline and mucous membranes are normal. Posterior oropharyngeal erythema (cobblestoning) present. No oropharyngeal exudate or posterior oropharyngeal edema.  Eyes: Conjunctivae are normal. Pupils are equal, round, and reactive to light. Right eye exhibits no discharge. Left eye exhibits no discharge. No scleral icterus.  Neck: Normal range of motion. Neck supple. No tracheal deviation present. No thyromegaly present.  Cardiovascular: Normal rate, regular rhythm and normal heart sounds.  Exam reveals no gallop and no friction rub.   No murmur heard. Pulmonary/Chest: Effort normal and breath sounds normal. No stridor. No respiratory distress. She has no decreased breath sounds. She has  no wheezes. She has no rhonchi. She has no rales.  Lymphadenopathy:    She has no cervical adenopathy.  Skin: Skin is warm and dry. She is not diaphoretic.  Vitals reviewed.      Assessment & Plan:     1. Upper respiratory tract infection, unspecified type Worsening symptoms that have not responded to OTC medications. Will give augmentin as below. Stay  well hydrated and get plenty of rest. Call if no symptom improvement or if symptoms worsen. - amoxicillin-clavulanate (AUGMENTIN) 875-125 MG tablet; Take 1 tablet by mouth 2 (two) times daily.  Dispense: 14 tablet; Refill: 0  2. Cough Worsening. Will give tessalon perles as below for cough. She is to call if no improvement. May consider CXR since patient is a smoker if cough does not improve.  - benzonatate (TESSALON) 200 MG capsule; Take 1 capsule (200 mg total) by mouth 3 (three) times daily as needed for cough.  Dispense: 30 capsule; Refill: 0       Margaretann LovelessJennifer M Aqil Goetting, PA-C  [facility]

## 2016-09-16 NOTE — Patient Instructions (Signed)
Upper Respiratory Infection, Adult Most upper respiratory infections (URIs) are caused by a virus. A URI affects the nose, throat, and upper air passages. The most common type of URI is often called "the common cold." Follow these instructions at home:  Take medicines only as told by your doctor.  Gargle warm saltwater or take cough drops to comfort your throat as told by your doctor.  Use a warm mist humidifier or inhale steam from a shower to increase air moisture. This may make it easier to breathe.  Drink enough fluid to keep your pee (urine) clear or pale yellow.  Eat soups and other clear broths.  Have a healthy diet.  Rest as needed.  Go back to work when your fever is gone or your doctor says it is okay.  You may need to stay home longer to avoid giving your URI to others.  You can also wear a face mask and wash your hands often to prevent spread of the virus.  Use your inhaler more if you have asthma.  Do not use any tobacco products, including cigarettes, chewing tobacco, or electronic cigarettes. If you need help quitting, ask your doctor. Contact a doctor if:  You are getting worse, not better.  Your symptoms are not helped by medicine.  You have chills.  You are getting more short of breath.  You have brown or red mucus.  You have yellow or brown discharge from your nose.  You have pain in your face, especially when you bend forward.  You have a fever.  You have puffy (swollen) neck glands.  You have pain while swallowing.  You have white areas in the back of your throat. Get help right away if:  You have very bad or constant:  Headache.  Ear pain.  Pain in your forehead, behind your eyes, and over your cheekbones (sinus pain).  Chest pain.  You have long-lasting (chronic) lung disease and any of the following:  Wheezing.  Long-lasting cough.  Coughing up blood.  A change in your usual mucus.  You have a stiff neck.  You have  changes in your:  Vision.  Hearing.  Thinking.  Mood. This information is not intended to replace advice given to you by your health care provider. Make sure you discuss any questions you have with your health care provider. Document Released: 02/17/2008 Document Revised: 05/03/2016 Document Reviewed: 12/06/2013 Elsevier Interactive Patient Education  2017 Elsevier Inc.  

## 2016-09-22 ENCOUNTER — Telehealth: Payer: Self-pay | Admitting: Physician Assistant

## 2016-09-22 DIAGNOSIS — J069 Acute upper respiratory infection, unspecified: Secondary | ICD-10-CM

## 2016-09-22 MED ORDER — AMOXICILLIN-POT CLAVULANATE 875-125 MG PO TABS: 1.0000 | ORAL_TABLET | Freq: Two times a day (BID) | ORAL | 0 refills | Status: DC

## 2016-09-22 NOTE — Telephone Encounter (Signed)
Pt advised. [place] Marchiano Drozdowski, CMA  

## 2016-09-22 NOTE — Telephone Encounter (Signed)
Please advise. Molly Leonard, CMA  

## 2016-09-22 NOTE — Telephone Encounter (Signed)
Pt states she was seen on 09/16/2016 for sinus congestion.  Pt states she is feeling better and the Rx for amoxicillin-clavulanate (AUGMENTIN) 875-125 MG is helping.  Pt states she only has 1 pill left and is requesting a refill if possible.  [facility]  CB#[phone]/MW

## 2016-09-22 NOTE — Telephone Encounter (Signed)
Refill sent.

## 2016-10-01 ENCOUNTER — Ambulatory Visit: Payer: Self-pay | Admitting: Physician Assistant

## 2016-10-07 ENCOUNTER — Other Ambulatory Visit: Payer: Self-pay | Admitting: Physician Assistant

## 2016-10-07 DIAGNOSIS — F331 Major depressive disorder, recurrent, moderate: Secondary | ICD-10-CM

## 2016-10-14 ENCOUNTER — Encounter: Payer: Self-pay | Admitting: Physician Assistant

## 2016-10-14 ENCOUNTER — Ambulatory Visit (INDEPENDENT_AMBULATORY_CARE_PROVIDER_SITE_OTHER): Payer: Medicare Other | Admitting: Physician Assistant

## 2016-10-14 VITALS — BP 140/70 | HR 98 | Temp 97.6°F | Resp 16 | Wt 120.6 lb

## 2016-10-14 DIAGNOSIS — F329 Major depressive disorder, single episode, unspecified: Secondary | ICD-10-CM | POA: Diagnosis not present

## 2016-10-14 DIAGNOSIS — F32A Depression, unspecified: Secondary | ICD-10-CM

## 2016-10-14 DIAGNOSIS — B370 Candidal stomatitis: Secondary | ICD-10-CM

## 2016-10-14 MED ORDER — CITALOPRAM HYDROBROMIDE 40 MG PO TABS: 40.0000 mg | ORAL_TABLET | Freq: Every day | ORAL | 1 refills | Status: DC

## 2016-10-14 MED ORDER — NYSTATIN 100000 UNIT/ML MT SUSP: OROMUCOSAL | 0 refills | Status: DC

## 2016-10-14 NOTE — Progress Notes (Signed)
Patient: Molly Leonard Female    DOB: [birth year]   70 y.o.   MRN: [id number] Visit Date: 10/14/2016  Today's Provider: Margaretann Loveless, PA-C   Chief Complaint  Patient presents with  . Follow-up    Depression   Subjective:    HPI Patient is here today to follow-up on Depression.   Depression, Follow up:  The patient was last seen for Depression 1 months ago. Changes made since that visit include start slow discontinue of Cymbalta then discontinue and start Celexa. She reports that at first she thought the Celexa was working but yesterday she was so depressed and stressed. She reports good compliance with treatment. She is not having side effects.   She IS experiencing depressed mood,difficulty concentrating, fatigue, feelings of worthlessness/guilt, hopelessness, impaired memory and suicidal thoughts without plan. She states "The only reason I won't take my life away is because of my granddaughter, I want her to be old enough to take care of her brother" "I wish I'll die".She reports that her job is very stressful. ------------------------------------------------------------------------    No Known Allergies   Current Outpatient Prescriptions:  .  ALPRAZolam (XANAX) 1 MG tablet, Take 1 tablet (1 mg total) by mouth 3 (three) times daily., Disp: 90 tablet, Rfl: 5 .  citalopram (CELEXA) 20 MG tablet, Take 1 tablet (20 mg total) by mouth daily., Disp: 30 tablet, Rfl: 5 .  amoxicillin-clavulanate (AUGMENTIN) 875-125 MG tablet, Take 1 tablet by mouth 2 (two) times daily. (Patient not taking: Reported on 10/14/2016), Disp: 14 tablet, Rfl: 0 .  benzonatate (TESSALON) 200 MG capsule, Take 1 capsule (200 mg total) by mouth 3 (three) times daily as needed for cough. (Patient not taking: Reported on 10/14/2016), Disp: 30 capsule, Rfl: 0 .  DULoxetine (CYMBALTA) 30 MG capsule, Take 1 capsule (30 mg total) by mouth daily. (Patient not taking: Reported on 10/14/2016), Disp: 14 capsule,  Rfl: 0 .  SYMBICORT 160-4.5 MCG/ACT inhaler, TAKE 2 PUFFS TWICE A DAY (Patient not taking: Reported on 09/16/2016), Disp: 10.2 g, Rfl: 5 .  VENTOLIN HFA 108 (90 Base) MCG/ACT inhaler, INHALE 2 PUFFS EVERY 4 HOURS AS NEEDED FOR SHORTNESS OF BREATH (Patient not taking: Reported on 09/16/2016), Disp: 18 g, Rfl: 5  Review of Systems  Constitutional: Negative.   Respiratory: Negative.   Cardiovascular: Negative.   Gastrointestinal: Negative.   Neurological: Negative.   Psychiatric/Behavioral: Positive for agitation and dysphoric mood. Negative for self-injury, sleep disturbance and suicidal ideas. The patient is nervous/anxious.     Social History  Substance Use Topics  . Smoking status: Current Every Day Smoker    Packs/day: 1.00  . Smokeless tobacco: Never Used  . Alcohol use No   Objective:   BP 140/70 (BP Location: Right Arm, Patient Position: Sitting, Cuff Size: Normal) Comment: crying  Pulse 98   Temp 97.6 F (36.4 C) (Oral)   Resp 16   Wt 120 lb 9.6 oz (54.7 kg)   BMI 18.89 kg/m   Physical Exam  Constitutional: She appears well-developed and well-nourished. No distress.  Neck: Normal range of motion. Neck supple.  Cardiovascular: Normal rate, regular rhythm and normal heart sounds.  Exam reveals no gallop and no friction rub.   No murmur heard. Pulmonary/Chest: Effort normal and breath sounds normal. No respiratory distress. She has no wheezes. She has no rales.  Skin: She is not diaphoretic.  Psychiatric: Her speech is normal and behavior is normal. Judgment and thought content normal. Cognition and  memory are normal. She exhibits a depressed mood.  Vitals reviewed.     Assessment & Plan:     1. Depression, unspecified depression type She does report that she had been doing well up until yesterday then she had a bad day. I will increase her Celexa to 40 mg daily. I will see her back in 4 weeks to see how she is doing with the increased. She is to call the office if symptoms  worsen in the meantime. - citalopram (CELEXA) 40 MG tablet; Take 1 tablet (40 mg total) by mouth daily.  Dispense: 30 tablet; Refill: 1  2. Thrush Stable. Diagnosis pulled for medication refill. Continue current medical treatment plan. - nystatin (MYCOSTATIN) 100000 UNIT/ML suspension; Take 5mL PO swish and spit every 8 hrs prn  Dispense: 60 mL; Refill: 0       Margaretann LovelessJennifer M Tansy Lorek, PA-C  [facility]

## 2016-10-14 NOTE — Patient Instructions (Signed)
Complicated Grieving Introduction Grief is a normal response to the death of someone close to you. Feelings of fear, anger, and guilt can affect almost everyone who loses a loved one. It is also common to have symptoms of depression while you are grieving. These include problems with sleep, loss of appetite, and lack of energy. They may last for weeks or months after a loss. Complicated grief is different from normal grief or depression. Normal grieving involves sadness and feelings of loss, but these feelings are not constant. Complicated grief is a constant and severe type of grief. It interferes with your ability to function normally. It may last for several months to a year or longer. Complicated grief may require treatment from a mental health care provider. What are the causes? It is not known why some people continue to struggle with grief and others do not. You may be at higher risk for complicated grief if:  The death of your loved one was sudden or unexpected.  The death of your loved one was due to a violent event.  Your loved one committed suicide.  Your loved one was a child or a young person.  You were very close to or dependent on the loved one.  You have a history of depression. What are the signs or symptoms? Signs and symptoms of complicated grief may include:  Feeling disbelief or numbness.  Being unable to enjoy good memories of your loved one.  Needing to avoid anything that reminds you of your loved one.  Being unable to stop thinking about the death.  Feeling intense anger or guilt.  Feeling alone and hopeless.  Feeling that your life is meaningless and empty.  Losing the desire to live. How is this diagnosed? Your health care provider may diagnose complicated grief if:  You have constant symptoms of grief for 6-12 months or longer.  Your symptoms are interfering with your ability to live your life. Your health care provider may want you to see a  mental health care provider. Many symptoms of depression are similar to the symptoms of complicated grief. It is important to be evaluated for complicated grief along with other mental health conditions. How is this treated? Talk therapy with a mental health provider is the most common treatment for complicated grief. During therapy, you will learn healthy ways to cope with the loss of your loved one. In some cases, your mental health care provider may also recommend antidepressant medicines. Follow these instructions at home:  Take care of yourself.  Eat regular meals and maintain a healthy diet. Eat plenty of fruits, vegetables, and whole grains.  Try to get some exercise each day.  Keep regular hours for sleep. Try to get at least 8 hours of sleep each night.  Do not use drugs or alcohol to ease your symptoms.  Take medicines only as directed by your health care provider.  Spend time with friends and loved ones.  Consider joining a grief (bereavement) support group to help you deal with your loss.  Keep all follow-up visits as directed by your health care provider. This is important. Contact a health care provider if:  Your symptoms keep you from functioning normally.  Your symptoms do not get better with treatment. Get help right away if:  You have serious thoughts of hurting yourself or someone else.  You have suicidal feelings. This information is not intended to replace advice given to you by your health care provider. Make sure you discuss any   questions you have with your health care provider. Document Released: 08/31/2005 Document Revised: 02/06/2016 Document Reviewed: 02/08/2014  2017 Elsevier  

## 2016-10-30 ENCOUNTER — Encounter: Payer: Self-pay | Admitting: Family Medicine

## 2016-10-30 ENCOUNTER — Ambulatory Visit (INDEPENDENT_AMBULATORY_CARE_PROVIDER_SITE_OTHER): Payer: Medicare Other | Admitting: Family Medicine

## 2016-10-30 VITALS — BP 128/64 | HR 100 | Temp 98.3°F | Resp 16 | Wt 124.0 lb

## 2016-10-30 DIAGNOSIS — Z20828 Contact with and (suspected) exposure to other viral communicable diseases: Secondary | ICD-10-CM

## 2016-10-30 DIAGNOSIS — B349 Viral infection, unspecified: Secondary | ICD-10-CM

## 2016-10-30 MED ORDER — ALBUTEROL SULFATE HFA 108 (90 BASE) MCG/ACT IN AERS: INHALATION_SPRAY | RESPIRATORY_TRACT | 5 refills | Status: DC

## 2016-10-30 MED ORDER — OSELTAMIVIR PHOSPHATE 75 MG PO CAPS: 75.0000 mg | ORAL_CAPSULE | Freq: Two times a day (BID) | ORAL | 0 refills | Status: DC

## 2016-10-30 NOTE — Patient Instructions (Signed)
Discussed use of Mucinex D for congestion and Delsym for cough. 

## 2016-10-30 NOTE — Progress Notes (Signed)
Subjective:     Patient ID: Molly Leonard, female   DOB: 01/06/1946, 71 y.o.   MRN: [id number]  HPI  Chief Complaint  Patient presents with  . URI    Pt c/o cough (productive of dark sputum), sore throat, congestion, and body aches. Pt's grandson (who lives with pt) was diagnosed with the flu earlier this week. Pt's husband was seen yesteday for exposure to the flu, and was given a Tamiflu prescription. Pt is concerned because she is prone to pneumonia.  She has just started symptoms today. Has misplaced her albuterol inhaler. Feels more short of breath than usual. Not on Symbicort: "makes me jittery." States she is both smoking and vaping.   Review of Systems     Objective:   Physical Exam  Constitutional: She appears well-developed and well-nourished. No distress.  Ears: T.M's intact without inflammation Throat: no tonsillar enlargement or exudate Neck: no cervical adenopathy Lungs: clear     Assessment:    1. Exposure to the flu - oseltamivir (TAMIFLU) 75 MG capsule; Take 1 capsule (75 mg total) by mouth 2 (two) times daily.  Dispense: 10 capsule; Refill: 0  2. Viral syndrome - albuterol (VENTOLIN HFA) 108 (90 Base) MCG/ACT inhaler; INHALE 2 PUFFS EVERY 4 HOURS AS NEEDED FOR SHORTNESS OF BREATH  Dispense: 18 g; Refill: 5    Plan:    Discussed use of Mucinex D and Delysm. Call for increased shortness of breath or not improving over the next week. Discussed Saturday clinic with her.

## 2016-11-04 ENCOUNTER — Encounter: Payer: Self-pay | Admitting: Physician Assistant

## 2016-11-04 ENCOUNTER — Ambulatory Visit (INDEPENDENT_AMBULATORY_CARE_PROVIDER_SITE_OTHER): Payer: Medicare Other | Admitting: Physician Assistant

## 2016-11-04 VITALS — BP 108/54 | HR 72 | Temp 98.5°F | Resp 16 | Wt 119.0 lb

## 2016-11-04 DIAGNOSIS — R519 Headache, unspecified: Secondary | ICD-10-CM

## 2016-11-04 DIAGNOSIS — J441 Chronic obstructive pulmonary disease with (acute) exacerbation: Secondary | ICD-10-CM | POA: Diagnosis not present

## 2016-11-04 DIAGNOSIS — R51 Headache: Secondary | ICD-10-CM

## 2016-11-04 MED ORDER — PREDNISONE 20 MG PO TABS: 40.0000 mg | ORAL_TABLET | Freq: Every day | ORAL | 0 refills | Status: AC

## 2016-11-04 NOTE — Patient Instructions (Signed)
Chronic Obstructive Pulmonary Disease Exacerbation  Chronic obstructive pulmonary disease (COPD) is a common lung problem. In COPD, the flow of air from the lungs is limited. COPD exacerbations are times that breathing gets worse and you need extra treatment. Without treatment they can be life threatening. If they happen often, your lungs can become more damaged. If your COPD gets worse, your doctor may treat you with:  ? Medicines.  ? Oxygen.  ? Different ways to clear your airway, such as using a mask.    Follow these instructions at home:  ? Do not smoke.  ? Avoid tobacco smoke and other things that bother your lungs.  ? If given, take your antibiotic medicine as told. Finish the medicine even if you start to feel better.  ? Only take medicines as told by your doctor.  ? Drink enough fluids to keep your pee (urine) clear or pale yellow (unless your doctor has told you not to).  ? Use a cool mist machine (vaporizer).  ? If you use oxygen or a machine that turns liquid medicine into a mist (nebulizer), continue to use them as told.  ? Keep up with shots (vaccinations) as told by your doctor.  ? Exercise regularly.  ? Eat healthy foods.  ? Keep all doctor visits as told.  Get help right away if:  ? You are very short of breath and it gets worse.  ? You have trouble talking.  ? You have bad chest pain.  ? You have blood in your spit (sputum).  ? You have a fever.  ? You keep throwing up (vomiting).  ? You feel weak, or you pass out (faint).  ? You feel confused.  ? You keep getting worse.  This information is not intended to replace advice given to you by your health care provider. Make sure you discuss any questions you have with your health care provider.  Document Released: 08/20/2011 Document Revised: 02/06/2016 Document Reviewed: 05/05/2013  Elsevier Interactive Patient Education ? 2017 [employer]

## 2016-11-04 NOTE — Progress Notes (Signed)
Patient: Molly Leonard Female    DOB: 1946/07/11   71 y.o.   MRN: [id number] Visit Date: 11/04/2016  Today's Provider: Trey Sailors, PA-C   Chief Complaint  Patient presents with  . Headache    Started Tuesday afternoon   Subjective:    Headache   This is a new problem. The current episode started yesterday. The problem has been unchanged. The pain is located in the frontal (On the right side) region. The pain does not radiate. The pain quality is not similar to prior headaches. The quality of the pain is described as stabbing. The pain is at a severity of 8/10. Associated symptoms include coughing. Pertinent negatives include no abdominal pain, dizziness, ear pain, eye pain, eye redness, eye watering, fever, insomnia, loss of balance, nausea, numbness, phonophobia, photophobia, seizures, sinus pressure, sore throat, swollen glands, visual change or weakness. The treatment provided no relief.   Pt is 71 y/o woman with history of COPD and current tobacco abuse presenting with headache and cough. Above HPI for headache. She is saying it is the worst headache of her life and different from her past headaches. She has finished Tamiflu yesterday for flu exposure.  Additionally she is c/o coughing and wheezing. Is coughing up some sputum but it is her baseline, no increase in volume or sputum purulence. She is not having fevers, chills, nausea, vomiting.    No Known Allergies   Current Outpatient Prescriptions:  .  albuterol (VENTOLIN HFA) 108 (90 Base) MCG/ACT inhaler, INHALE 2 PUFFS EVERY 4 HOURS AS NEEDED FOR SHORTNESS OF BREATH, Disp: 18 g, Rfl: 5 .  ALPRAZolam (XANAX) 1 MG tablet, Take 1 tablet (1 mg total) by mouth 3 (three) times daily., Disp: 90 tablet, Rfl: 5 .  citalopram (CELEXA) 40 MG tablet, Take 1 tablet (40 mg total) by mouth daily., Disp: 30 tablet, Rfl: 1 .  nystatin (MYCOSTATIN) 100000 UNIT/ML suspension, Take 5mL PO swish and spit every 8 hrs prn, Disp: 60 mL, Rfl:  0 .  SYMBICORT 160-4.5 MCG/ACT inhaler, TAKE 2 PUFFS TWICE A DAY, Disp: 10.2 g, Rfl: 5  Review of Systems  Constitutional: Negative.  Negative for fever.  HENT: Negative.  Negative for ear pain, sinus pressure and sore throat.   Eyes: Negative for photophobia, pain and redness.  Respiratory: Positive for cough, chest tightness, shortness of breath and wheezing. Negative for apnea, choking and stridor.   Gastrointestinal: Negative.  Negative for abdominal pain and nausea.  Musculoskeletal: Negative.   Neurological: Positive for headaches. Negative for dizziness, tremors, seizures, syncope, facial asymmetry, speech difficulty, weakness, light-headedness, numbness and loss of balance.  Psychiatric/Behavioral: The patient does not have insomnia.     Social History  Substance Use Topics  . Smoking status: Current Every Day Smoker    Packs/day: 1.00  . Smokeless tobacco: Never Used  . Alcohol use No   Objective:   BP (!) 108/54 (BP Location: Left Arm, Patient Position: Sitting, Cuff Size: Normal)   Pulse 72   Temp 98.5 F (36.9 C) (Oral)   Resp 16   Wt 119 lb (54 kg)   BMI 18.64 kg/m   Physical Exam  Constitutional: She is oriented to person, place, and time. She appears well-developed and well-nourished. No distress.  HENT:  Head: Normocephalic and atraumatic.  Right Ear: Tympanic membrane and external ear normal.  Left Ear: Tympanic membrane and external ear normal.  Mouth/Throat: Uvula is midline and oropharynx is clear and moist.  No oropharyngeal exudate, posterior oropharyngeal edema, posterior oropharyngeal erythema or tonsillar abscesses.  Eyes: Conjunctivae are normal. Pupils are equal, round, and reactive to light.  Cardiovascular: Normal rate and regular rhythm.   Pulmonary/Chest: Effort normal. No respiratory distress. She has wheezes. She has no rales.  Neurological: She is alert and oriented to person, place, and time. She has normal strength and normal reflexes. No  cranial nerve deficit or sensory deficit. She displays a negative Romberg sign. Coordination and gait normal. GCS eye subscore is 4. GCS verbal subscore is 5. GCS motor subscore is 6.  Skin: Skin is warm and dry.  Psychiatric: She has a normal mood and affect. Her behavior is normal.        Assessment & Plan:     1. Acute nonintractable headache, unspecified headache type  Patient says this is the worst headache of her life. I have discussed at length with patient that with this kind of complaint, I am obligated to treat it as a stroke until proven otherwise and advised her to go to the emergency department. She says "I won't go, I think it's a joke over there." I told her I cannot make her go, but that if she wouldn't go, I could get a stat head CT w/o contrast to assess this complaint. She refuses this. I have made explicit to patient that by her refusing these recommendations of care she is accepting the potential of having undiagnosed internal bleeding that can result in death or permanent physical/mental impairment. She voices understanding and still refuses.   2. COPD exacerbation (HCC)  Treat as below. No increased sputum/sputum purulence, so steroids only. Pt to take Symbicort daily and albuterol Q6H x 2 days.  - predniSONE (DELTASONE) 20 MG tablet; Take 2 tablets (40 mg total) by mouth daily with breakfast.  Dispense: 10 tablet; Refill: 0  Return if symptoms worsen or fail to improve.  The entirety of the information documented in the History of Present Illness, Review of Systems and Physical Exam were personally obtained by me. Portions of this information were initially documented by Kavin LeechLaura Cletis Muma, CMA and reviewed by me for thoroughness and accuracy.        Trey SailorsAdriana M Pollak, PA-C  [facility]

## 2016-11-08 ENCOUNTER — Encounter: Payer: Self-pay | Admitting: Emergency Medicine

## 2016-11-08 ENCOUNTER — Emergency Department
Admission: EM | Admit: 2016-11-08 | Discharge: 2016-11-08 | Disposition: A | Discharge status: Home / Self Care | Payer: Medicare Other | Attending: Emergency Medicine | Admitting: Emergency Medicine

## 2016-11-08 ENCOUNTER — Emergency Department: Payer: Medicare Other

## 2016-11-08 DIAGNOSIS — J449 Chronic obstructive pulmonary disease, unspecified: Secondary | ICD-10-CM | POA: Diagnosis not present

## 2016-11-08 DIAGNOSIS — F17200 Nicotine dependence, unspecified, uncomplicated: Secondary | ICD-10-CM | POA: Diagnosis not present

## 2016-11-08 DIAGNOSIS — R002 Palpitations: Secondary | ICD-10-CM | POA: Insufficient documentation

## 2016-11-08 DIAGNOSIS — Z79899 Other long term (current) drug therapy: Secondary | ICD-10-CM | POA: Diagnosis not present

## 2016-11-08 LAB — TSH: TSH: 0.448 u[IU]/mL (ref 0.350–4.500)

## 2016-11-08 LAB — CBC
HCT: 29.6 % — ABNORMAL LOW (ref 35.0–47.0)
Hemoglobin: 10 g/dL — ABNORMAL LOW (ref 12.0–16.0)
MCH: 36 pg — ABNORMAL HIGH (ref 26.0–34.0)
MCHC: 33.8 g/dL (ref 32.0–36.0)
MCV: 106.5 fL — ABNORMAL HIGH (ref 80.0–100.0)
PLATELETS: 470 10*3/uL — AB (ref 150–440)
RBC: 2.78 MIL/uL — AB (ref 3.80–5.20)
RDW: 13.1 % (ref 11.5–14.5)
WBC: 8.1 10*3/uL (ref 3.6–11.0)

## 2016-11-08 LAB — BASIC METABOLIC PANEL
Anion gap: 10 (ref 5–15)
BUN: 33 mg/dL — AB (ref 6–20)
CALCIUM: 9.5 mg/dL (ref 8.9–10.3)
CO2: 23 mmol/L (ref 22–32)
CREATININE: 0.99 mg/dL (ref 0.44–1.00)
Chloride: 106 mmol/L (ref 101–111)
GFR calc non Af Amer: 57 mL/min — ABNORMAL LOW (ref >60)
Glucose, Bld: 150 mg/dL — ABNORMAL HIGH (ref 65–99)
Potassium: 3.9 mmol/L (ref 3.5–5.1)
SODIUM: 139 mmol/L (ref 135–145)

## 2016-11-08 LAB — TROPONIN I: Troponin I: <0.03 ng/mL (ref <0.03)

## 2016-11-08 NOTE — Discharge Instructions (Signed)
You have been seen in the Emergency Department (ED) today for a racing heart.  As we have discussed today?s test results are normal, but you may require further testing.  Please follow up with the recommended doctor as instructed above in these documents regarding today?s emergent visit and your recent symptoms to discuss further management.  Continue to take your regular medications. If you are not doing so already, please also take a daily baby aspirin (81 mg), at least until you follow up with your doctor.  Return to the Emergency Department (ED) if you experience any further chest pain/pressure/tightness, difficulty breathing, or sudden sweating, or other symptoms that concern you.

## 2016-11-08 NOTE — ED Provider Notes (Signed)
[facility] Emergency Department Provider Note  ____________________________________________   First MD Initiated Contact with Patient 11/08/16 1646     (approximate)  I have reviewed the triage vital signs and the nursing notes.   HISTORY  Chief Complaint Chest Pain    HPI Molly Leonard is a [age] y.o. female previous history of COPD, anxiety, depression.  Patient presents today reports for about the last 5-6 days she's been experiencing off and on feeling of a racing in her chest. This will come and go, makes her feel very anxious, but she has not explains any feeling as though she was going to pass out and she denies having "chest pain". Instead of "pain" she describes it as a feeling of palpitations and a racing of her heart will come and go.   Patient reports that at the present time she has no chest discomfort or palpitations. She also reports that about 5 days ago or so she had a very bad headache, was seen in urgent care, but that headache is gone away. No numbness tingling or weakness. No fevers or chills. No neck pain. She also reports that she does not wish to have her headache issue addressed today, she has a follow-up with her doctor on Wednesday, but is wondering what might be causing her heart to race off and on  She does note that she recently started citalopram.  Denies any personal history of heart problems. She does smoke.  Past Medical History:  Diagnosis Date  . Anxiety   . Depression     Patient Active Problem List   Diagnosis Date Noted  . Dyspareunia in female 12/04/2015  . Allergic rhinitis 10/22/2015  . Absolute anemia 10/22/2015  . Airway hyperreactivity 10/22/2015  . 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 Chronic obstructive pulmonary disease (HCC) 10/22/2015  . Esophagitis, reflux 10/22/2015  . Hypercholesterolemia 10/22/2015  . Cannot sleep 10/22/2015  . Arthritis, degenerative 10/22/2015  . Episodic paroxysmal  anxiety disorder 10/22/2015  . Current tobacco use 10/22/2015  . Anxiety 05/21/2015  . Depression 03/25/2015    Past Surgical History:  Procedure Laterality Date  . ABDOMINAL HYSTERECTOMY  1973    Prior to Admission medications   Medication Sig Start Date End Date Taking? Authorizing Provider  albuterol (VENTOLIN HFA) 108 (90 Base) MCG/ACT inhaler INHALE 2 PUFFS EVERY 4 HOURS AS NEEDED FOR SHORTNESS OF BREATH 10/30/16  Yes Anola Gurneyobert Chauvin, PA  ALPRAZolam Prudy Feeler(XANAX) 1 MG tablet Take 1 tablet (1 mg total) by mouth 3 (three) times daily. 07/30/16  Yes Alessandra BevelsJennifer M Burnette, PA-C  citalopram (CELEXA) 40 MG tablet Take 1 tablet (40 mg total) by mouth daily. 10/14/16  Yes Alessandra BevelsJennifer M Burnette, PA-C  nystatin (MYCOSTATIN) 100000 UNIT/ML suspension Take 5mL PO swish and spit every 8 hrs prn 10/14/16  Yes Alessandra BevelsJennifer M Burnette, PA-C  predniSONE (DELTASONE) 20 MG tablet Take 2 tablets (40 mg total) by mouth daily with breakfast. 11/04/16 11/09/16 Yes Trey SailorsAdriana M Pollak, PA-C  SYMBICORT 160-4.5 MCG/ACT inhaler TAKE 2 PUFFS TWICE A DAY Patient not taking: Reported on 11/08/2016 02/05/16   Lorie PhenixNancy Maloney, MD    Allergies Patient has no known allergies.  Family History  Problem Relation Age of Onset  . Heart disease Mother   . Diabetes Mother   . Multiple sclerosis Sister   . Bone cancer Brother     Social History Social History  Substance Use Topics  . Smoking status: Current Every Day Smoker    Packs/day: 1.00  .  Smokeless tobacco: Never Used  . Alcohol use No    Review of Systems Constitutional: No fever/chills Eyes: No visual changes. ENT: No sore throat. Cardiovascular: Denies chest pain.See history of present illness Respiratory: Denies shortness of breath. Gastrointestinal: No abdominal pain.  No nausea, no vomiting.  No diarrhea.  No constipation. Genitourinary: Negative for dysuria. Musculoskeletal: Negative for back pain. Skin: Negative for rash. Neurological: Negative for headaches,  focal weakness or numbness.  10-point ROS otherwise negative.  ____________________________________________   PHYSICAL EXAM:  VITAL SIGNS: ED Triage Vitals  Enc Vitals Group     BP 11/08/16 1346 (!) 141/67     Pulse Rate 11/08/16 1346 85     Resp 11/08/16 1632 15     Temp 11/08/16 1346 98.6 F (37 C)     Temp Source 11/08/16 1346 Oral     SpO2 11/08/16 1346 97 %     Weight 11/08/16 1344 119 lb (54 kg)     Height 11/08/16 1344 5\' 7"  (1.702 m)     Head Circumference --      Peak Flow --      Pain Score 11/08/16 1344 8     Pain Loc --      Pain Edu? --      Excl. in GC? --     Constitutional: Alert and oriented. Well appearing and in no acute distress. Eyes: Conjunctivae are normal. PERRL. EOMI. Head: Atraumatic. Nose: No congestion/rhinnorhea. Mouth/Throat: Mucous membranes are moist.  Oropharynx non-erythematous. Neck: No stridor.  No meningismus. Cardiovascular: Normal rate, regular rhythm. Grossly normal heart sounds.  Good peripheral circulation. Respiratory: Normal respiratory effort.  No retractions. Lungs CTAB. Gastrointestinal: Soft and nontender. No distention.  Musculoskeletal: No lower extremity tenderness nor edema.   Neurologic:  Normal speech and language. No gross focal neurologic deficits are appreciated. Moves all extremities normally. Normal reflexes. No increased tone. Skin:  Skin is warm, dry and intact. No rash noted. Psychiatric: Mood and affect are normal. Speech and behavior are normal.  ____________________________________________   LABS (all labs ordered are listed, but only abnormal results are displayed)  Labs Reviewed  BASIC METABOLIC PANEL - Abnormal; Notable for the following:       Result Value   Glucose, Bld 150 (*)    BUN 33 (*)    GFR calc non Af Amer 57 (*)    All other components within normal limits  CBC - Abnormal; Notable for the following:    RBC 2.78 (*)    Hemoglobin 10.0 (*)    HCT 29.6 (*)    MCV 106.5 (*)    MCH  36.0 (*)    Platelets 470 (*)    All other components within normal limits  TROPONIN I  TROPONIN I  TSH   ____________________________________________  EKG  Reviewed and interpreted me at 1352 Ventricular rate 80 QRS 70 QTc 400 Normal sinus rhythm, no evidence of ischemia or ectopy noted. ____________________________________________  RADIOLOGY  Dg Chest 2 View  Result Date: 11/08/2016 CLINICAL DATA:  Chest pain for 1 week. EXAM: CHEST  2 VIEW COMPARISON:  03/11/2011 and prior exams FINDINGS: The cardiomediastinal silhouette is unremarkable. Probable COPD changes noted. There is no evidence of focal airspace disease, pulmonary edema, suspicious pulmonary nodule/mass, pleural effusion, or pneumothorax. No acute bony abnormalities are identified. IMPRESSION: No evidence of acute cardiopulmonary disease. Probable COPD changes. Electronically Signed   By: Harmon Pier M.D.   On: 11/08/2016 15:08    ____________________________________________   PROCEDURES  Procedure(s) performed: None  Procedures  Critical Care performed: No  ____________________________________________   INITIAL IMPRESSION / ASSESSMENT AND PLAN / ED COURSE  Pertinent labs & imaging results that were available during my care of the patient were reviewed by me and considered in my medical decision making (see chart for details).  Transfer evaluation of what sounds to be palpitations. She reports intermittent racing feeling of her heart associated with an anxious feeling. She denies that she is having actual chest pain. Symptoms are gone going off and on for several days. Her EKG and troponin are reassuring, making an acute coronary syndrome very unlikely. I do question if she may have an underlying arrhythmia, though she has no symptoms such as passing out, sudden weakness, feeling near syncopal, or trouble breathing associated. She has recently started citalopram which has known cardiac side effects, however she  demonstrates no elevation of her QT interval and I doubt she is having intermittent runs of polymorphic ventricular tachycardia.  ----------------------------------------- 6:33 PM on 11/08/2016 -----------------------------------------  Repeat EKG reviewed injury by me at 1736 Heart rate 65 QRS 99 QTc 460 Normal sinus rhythm, slight shortening of the PR interval, no evidence of acute ischemia.  Note the patient continues report no ongoing discomfort in the chest or palpitations. Currently asymptomatic.      Patient observed, second troponin normal. Asymptomatic throughout her observation. Patient plan is to follow-up with her primary doctor, has appointment Wednesday. We did divide very careful return precautions for which she is agreeable. Family taking her home, plan for follow-up with primary and also encouraged follow-up with cardiology.  ____________________________________________   FINAL CLINICAL IMPRESSION(S) / ED DIAGNOSES  Final diagnoses:  Intermittent palpitations      NEW MEDICATIONS STARTED DURING THIS VISIT:  Discharge Medication List as of 11/08/2016  7:15 PM       Note:  This document was prepared using Dragon voice recognition software and may include unintentional dictation errors.     Sharyn Creamer, MD 11/08/16 2111

## 2016-11-08 NOTE — ED Notes (Signed)
Patient presents to the ED with intermittent chest pain since Wednesday and severe headache on Wednesday.  Patient states the headache had subsided today but the chest pain had gotten worse prior to coming to the ED.  Patient states her chest pain has improved while waiting in the ED.  Patient is alert and oriented x 4.  Patient reports being started on citalopram approx. 1 month ago.

## 2016-11-08 NOTE — ED Triage Notes (Signed)
Pt c/o chest pain for 1 week.  Feels like chest is pounding. Pain is to right chest. Pain has been intermittent.  C/o feeling bad for last week.  Had headache on Wednesday and saw PCP who told pt to come to ED but she did not.  No longer has headache.  Has had some SHOB. Also c/o dizziness at this time.  NAD. Respirations unlabored.  Ambulatory to check in desk without difficulty.

## 2016-11-08 NOTE — ED Notes (Addendum)
Lab results and CXR reviewed. Awaiting room for MD eval.

## 2016-11-11 ENCOUNTER — Encounter: Payer: Self-pay | Admitting: Physician Assistant

## 2016-11-11 ENCOUNTER — Ambulatory Visit (INDEPENDENT_AMBULATORY_CARE_PROVIDER_SITE_OTHER): Payer: Medicare Other | Admitting: Physician Assistant

## 2016-11-11 VITALS — BP 110/60 | HR 78 | Temp 97.7°F | Resp 16 | Wt 124.0 lb

## 2016-11-11 DIAGNOSIS — J069 Acute upper respiratory infection, unspecified: Secondary | ICD-10-CM | POA: Diagnosis not present

## 2016-11-11 DIAGNOSIS — F3341 Major depressive disorder, recurrent, in partial remission: Secondary | ICD-10-CM

## 2016-11-11 MED ORDER — SERTRALINE HCL 50 MG PO TABS: 50.0000 mg | ORAL_TABLET | Freq: Every day | ORAL | 0 refills | Status: DC

## 2016-11-11 MED ORDER — DOXYCYCLINE HYCLATE 100 MG PO TABS: 100.0000 mg | ORAL_TABLET | Freq: Two times a day (BID) | ORAL | 0 refills | Status: DC

## 2016-11-11 NOTE — Patient Instructions (Signed)
Doxycycline tablets or capsules What is this medicine? DOXYCYCLINE (dox i SYE kleen) is a tetracycline antibiotic. It kills certain bacteria or stops their growth. It is used to treat many kinds of infections, like dental, skin, respiratory, and urinary tract infections. It also treats acne, Lyme disease, malaria, and certain sexually transmitted infections. This medicine may be used for other purposes; ask your health care provider or pharmacist if you have questions. COMMON BRAND NAME(S): Acticlate, Adoxa, Adoxa CK, Adoxa Pak, Adoxa TT, Alodox, Avidoxy, Doxal, Mondoxyne NL, Monodox, Morgidox 1x, Morgidox 1x Kit, Morgidox 2x, Morgidox 2x Kit, NutriDox, Ocudox, TARGADOX, Vibra-Tabs, Vibramycin What should I tell my health care provider before I take this medicine? They need to know if you have any of these conditions: -liver disease -long exposure to sunlight like working outdoors -stomach problems like colitis -an unusual or allergic reaction to doxycycline, tetracycline antibiotics, other medicines, foods, dyes, or preservatives -pregnant or trying to get pregnant -breast-feeding How should I use this medicine? Take this medicine by mouth with a full glass of water. Follow the directions on the prescription label. It is best to take this medicine without food, but if it upsets your stomach take it with food. Take your medicine at regular intervals. Do not take your medicine more often than directed. Take all of your medicine as directed even if you think you are better. Do not skip doses or stop your medicine early. Talk to your pediatrician regarding the use of this medicine in children. While this drug may be prescribed for selected conditions, precautions do apply. Overdosage: If you think you have taken too much of this medicine contact a poison control center or emergency room at once. NOTE: This medicine is only for you. Do not share this medicine with others. What if I miss a dose? If you  miss a dose, take it as soon as you can. If it is almost time for your next dose, take only that dose. Do not take double or extra doses. What may interact with this medicine? -antacids -barbiturates -birth control pills -bismuth subsalicylate -carbamazepine -methoxyflurane -other antibiotics -phenytoin -vitamins that contain iron -warfarin This list may not describe all possible interactions. Give your health care provider a list of all the medicines, herbs, non-prescription drugs, or dietary supplements you use. Also tell them if you smoke, drink alcohol, or use illegal drugs. Some items may interact with your medicine. What should I watch for while using this medicine? Tell your doctor or health care professional if your symptoms do not improve. Do not treat diarrhea with over the counter products. Contact your doctor if you have diarrhea that lasts more than 2 days or if it is severe and watery. Do not take this medicine just before going to bed. It may not dissolve properly when you lay down and can cause pain in your throat. Drink plenty of fluids while taking this medicine to also help reduce irritation in your throat. This medicine can make you more sensitive to the sun. Keep out of the sun. If you cannot avoid being in the sun, wear protective clothing and use sunscreen. Do not use sun lamps or tanning beds/booths. Birth control pills may not work properly while you are taking this medicine. Talk to your doctor about using an extra method of birth control. If you are being treated for a sexually transmitted infection, avoid sexual contact until you have finished your treatment. Your sexual partner may also need treatment. Avoid antacids, aluminum, calcium, magnesium, and  iron products for 4 hours before and 2 hours after taking a dose of this medicine. If you are using this medicine to prevent malaria, you should still protect yourself from contact with mosquitos. Stay in screened-in  areas, use mosquito nets, keep your body covered, and use an insect repellent. What side effects may I notice from receiving this medicine? Side effects that you should report to your doctor or health care professional as soon as possible: -allergic reactions like skin rash, itching or hives, swelling of the face, lips, or tongue -difficulty breathing -fever -itching in the rectal or genital area -pain on swallowing -redness, blistering, peeling or loosening of the skin, including inside the mouth -severe stomach pain or cramps -unusual bleeding or bruising -unusually weak or tired -yellowing of the eyes or skin Side effects that usually do not require medical attention (report to your doctor or health care professional if they continue or are bothersome): -diarrhea -loss of appetite -nausea, vomiting This list may not describe all possible side effects. Call your doctor for medical advice about side effects. You may report side effects to FDA at 1-800-FDA-1088. Where should I keep my medicine? Keep out of the reach of children. Store at room temperature, below 30 degrees C (86 degrees F). Protect from light. Keep container tightly closed. Throw away any unused medicine after the expiration date. Taking this medicine after the expiration date can make you seriously ill. NOTE: This sheet is a summary. It may not cover all possible information. If you have questions about this medicine, talk to your doctor, pharmacist, or health care provider.  2018 Elsevier/Gold Standard (2015-10-02 17:11:22) Sertraline tablets What is this medicine? SERTRALINE (SER tra leen) is used to treat depression. It may also be used to treat obsessive compulsive disorder, panic disorder, post-trauma stress, premenstrual dysphoric disorder (PMDD) or social anxiety. This medicine may be used for other purposes; ask your health care provider or pharmacist if you have questions. COMMON BRAND NAME(S): Zoloft What  should I tell my health care provider before I take this medicine? They need to know if you have any of these conditions: -bleeding disorders -bipolar disorder or a family history of bipolar disorder -glaucoma -heart disease -high blood pressure -history of irregular heartbeat -history of low levels of calcium, magnesium, or potassium in the blood -if you often drink alcohol -liver disease -receiving electroconvulsive therapy -seizures -suicidal thoughts, plans, or attempt; a previous suicide attempt by you or a family member -take medicines that treat or prevent blood clots -thyroid disease -an unusual or allergic reaction to sertraline, other medicines, foods, dyes, or preservatives -pregnant or trying to get pregnant -breast-feeding How should I use this medicine? Take this medicine by mouth with a glass of water. Follow the directions on the prescription label. You can take it with or without food. Take your medicine at regular intervals. Do not take your medicine more often than directed. Do not stop taking this medicine suddenly except upon the advice of your doctor. Stopping this medicine too quickly may cause serious side effects or your condition may worsen. A special MedGuide will be given to you by the pharmacist with each prescription and refill. Be sure to read this information carefully each time. Talk to your pediatrician regarding the use of this medicine in children. While this drug may be prescribed for children as young as 7 years for selected conditions, precautions do apply. Overdosage: If you think you have taken too much of this medicine contact a poison control  center or emergency room at once. NOTE: This medicine is only for you. Do not share this medicine with others. What if I miss a dose? If you miss a dose, take it as soon as you can. If it is almost time for your next dose, take only that dose. Do not take double or extra doses. What may interact with this  medicine? Do not take this medicine with any of the following medications: -cisapride -dofetilide -dronedarone -linezolid -MAOIs like Carbex, Eldepryl, Marplan, Nardil, and Parnate -methylene blue (injected into a vein) -pimozide -thioridazine This medicine may also interact with the following medications: -alcohol -amphetamines -aspirin and aspirin-like medicines -certain medicines for depression, anxiety, or psychotic disturbances -certain medicines for fungal infections like ketoconazole, fluconazole, posaconazole, and itraconazole -certain medicines for irregular heart beat like flecainide, quinidine, propafenone -certain medicines for migraine headaches like almotriptan, eletriptan, frovatriptan, naratriptan, rizatriptan, sumatriptan, zolmitriptan -certain medicines for sleep -certain medicines for seizures like carbamazepine, valproic acid, phenytoin -certain medicines that treat or prevent blood clots like warfarin, enoxaparin, dalteparin -cimetidine -digoxin -diuretics -fentanyl -isoniazid -lithium -NSAIDs, medicines for pain and inflammation, like ibuprofen or naproxen -other medicines that prolong the QT interval (cause an abnormal heart rhythm) -rasagiline -safinamide -supplements like St. John's wort, kava kava, valerian -tolbutamide -tramadol -tryptophan This list may not describe all possible interactions. Give your health care provider a list of all the medicines, herbs, non-prescription drugs, or dietary supplements you use. Also tell them if you smoke, drink alcohol, or use illegal drugs. Some items may interact with your medicine. What should I watch for while using this medicine? Tell your doctor if your symptoms do not get better or if they get worse. Visit your doctor or health care professional for regular checks on your progress. Because it may take several weeks to see the full effects of this medicine, it is important to continue your treatment as  prescribed by your doctor. Patients and their families should watch out for new or worsening thoughts of suicide or depression. Also watch out for sudden changes in feelings such as feeling anxious, agitated, panicky, irritable, hostile, aggressive, impulsive, severely restless, overly excited and hyperactive, or not being able to sleep. If this happens, especially at the beginning of treatment or after a change in dose, call your health care professional. Dennis Bast may get drowsy or dizzy. Do not drive, use machinery, or do anything that needs mental alertness until you know how this medicine affects you. Do not stand or sit up quickly, especially if you are an older patient. This reduces the risk of dizzy or fainting spells. Alcohol may interfere with the effect of this medicine. Avoid alcoholic drinks. Your mouth may get dry. Chewing sugarless gum or sucking hard candy, and drinking plenty of water may help. Contact your doctor if the problem does not go away or is severe. What side effects may I notice from receiving this medicine? Side effects that you should report to your doctor or health care professional as soon as possible: -allergic reactions like skin rash, itching or hives, swelling of the face, lips, or tongue -anxious -black, tarry stools -changes in vision -confusion -elevated mood, decreased need for sleep, racing thoughts, impulsive behavior -eye pain -fast, irregular heartbeat -feeling faint or lightheaded, falls -feeling agitated, angry, or irritable -hallucination, loss of contact with reality -loss of balance or coordination -loss of memory -painful or prolonged erections -restlessness, pacing, inability to keep still -seizures -stiff muscles -suicidal thoughts or other mood changes -trouble sleeping -unusual bleeding  or bruising -unusually weak or tired -vomiting Side effects that usually do not require medical attention (report to your doctor or health care professional  if they continue or are bothersome): -change in appetite or weight -change in sex drive or performance -diarrhea -increased sweating -indigestion, nausea -tremors This list may not describe all possible side effects. Call your doctor for medical advice about side effects. You may report side effects to FDA at 1-800-FDA-1088. Where should I keep my medicine? Keep out of the reach of children. Store at room temperature between 15 and 30 degrees C (59 and 86 degrees F). Throw away any unused medicine after the expiration date. NOTE: This sheet is a summary. It may not cover all possible information. If you have questions about this medicine, talk to your doctor, pharmacist, or health care provider.  2018 Elsevier/Gold Standard (2016-09-04 14:17:49)

## 2016-11-11 NOTE — Progress Notes (Signed)
Patient: Molly Leonard Female    DOB: 06/04/1946   71 y.o.   MRN: [id number] Visit Date: 11/11/2016  Today's Provider: Margaretann LovelessJennifer M Litzi Binning, PA-C   Chief Complaint  Patient presents with  . Depression  . Anxiety  . Follow-up   Subjective:    HPI  Depression/anxiety Follow-up  She  was last seen for this 4 weeks ago. Changes made at last visit include increase celexa to 40 mg daily.   She reports excellent compliance with treatment. She is not having side effects.   She reports good tolerance of treatment. Current symptoms include: depressed mood, fatigue, feelings of worthlessness/guilt, hopelessness and impaired memory She feels she is Unchanged since last visit.  Patient is taking care of her [id number] and 70 year old grandchildren. ------------------------------------------------------------------------   Follow up ER visit  Patient was seen in ER for chest pain and palpitations on 11/08/2016. She was treated for intermittent palpitations . Treatment for this included no changes pt advised to fu with PCP. She reports good compliance with treatment. She reports this condition is Improved. ------------------------------------------------------------------------------------ She does feel she has been battling an URI viral infection as well. She reports over the last 3-4 weeks she has been having chills, increased fatigue, the palpitations, increased anxiety and cough. Her cough has been productive of green colored sputum. She reports her husband has had similar symptoms and was started on an antibiotic yesterday.    No Known Allergies   Current Outpatient Prescriptions:  .  albuterol (VENTOLIN HFA) 108 (90 Base) MCG/ACT inhaler, INHALE 2 PUFFS EVERY 4 HOURS AS NEEDED FOR SHORTNESS OF BREATH, Disp: 18 g, Rfl: 5 .  ALPRAZolam (XANAX) 1 MG tablet, Take 1 tablet (1 mg total) by mouth 3 (three) times daily., Disp: 90 tablet, Rfl: 5 .  citalopram (CELEXA) 40 MG tablet, Take 1  tablet (40 mg total) by mouth daily., Disp: 30 tablet, Rfl: 1 .  nystatin (MYCOSTATIN) 100000 UNIT/ML suspension, Take 5mL PO swish and spit every 8 hrs prn, Disp: 60 mL, Rfl: 0 .  SYMBICORT 160-4.5 MCG/ACT inhaler, TAKE 2 PUFFS TWICE A DAY, Disp: 10.2 g, Rfl: 5  Review of Systems  Constitutional: Positive for fatigue.  HENT: Positive for congestion, postnasal drip, rhinorrhea and sore throat. Negative for ear pain, nosebleeds, sinus pain, sinus pressure, sneezing and trouble swallowing.   Respiratory: Positive for cough and chest tightness. Negative for shortness of breath and wheezing.   Cardiovascular: Positive for palpitations.  Gastrointestinal: Negative for abdominal pain and nausea.  Neurological: Positive for headaches. Negative for dizziness.  Psychiatric/Behavioral: Positive for agitation, dysphoric mood and sleep disturbance. The patient is nervous/anxious.     Social History  Substance Use Topics  . Smoking status: Current Every Day Smoker    Packs/day: 1.00  . Smokeless tobacco: Never Used  . Alcohol use No   Objective:   BP 110/60 (BP Location: Left Arm, Patient Position: Sitting, Cuff Size: Normal)   Pulse 78   Temp 97.7 F (36.5 C) (Oral)   Resp 16   Wt 124 lb (56.2 kg)   SpO2 99%   BMI 19.42 kg/m   Physical Exam  Constitutional: She appears well-developed and well-nourished. No distress.  HENT:  Head: Normocephalic and atraumatic.  Right Ear: Hearing, tympanic membrane, external ear and ear canal normal.  Left Ear: Hearing, tympanic membrane, external ear and ear canal normal.  Nose: Nose normal. Right sinus exhibits no maxillary sinus tenderness and no frontal sinus tenderness.  Left sinus exhibits no maxillary sinus tenderness and no frontal sinus tenderness.  Mouth/Throat: Uvula is midline, oropharynx is clear and moist and mucous membranes are normal. No oropharyngeal exudate, posterior oropharyngeal edema or posterior oropharyngeal erythema.  Eyes:  Conjunctivae are normal. Pupils are equal, round, and reactive to light. Right eye exhibits no discharge. Left eye exhibits no discharge. No scleral icterus.  Neck: Normal range of motion. Neck supple. No tracheal deviation present. No thyromegaly present.  Cardiovascular: Normal rate, regular rhythm and normal heart sounds.  Exam reveals no gallop and no friction rub.   No murmur heard. Pulmonary/Chest: Effort normal and breath sounds normal. No stridor. No respiratory distress. She has no decreased breath sounds. She has no wheezes. She has no rhonchi. She has no rales.  Lymphadenopathy:    She has no cervical adenopathy.  Skin: Skin is warm and dry. She is not diaphoretic.  Psychiatric: Her speech is normal and behavior is normal. Judgment and thought content normal. Her mood appears anxious. Cognition and memory are normal. She exhibits a depressed mood.  Vitals reviewed.     Assessment & Plan:     1. Recurrent major depressive disorder, in partial remission (HCC) Intolerant to citalopram. Will switch to sertraline 50mg . She is to call if she has any adverse effects. I will see her back in 4 weeks to make sure she is tolerating well and see if dose needs to be increased.  - sertraline (ZOLOFT) 50 MG tablet; Take 1 tablet (50 mg total) by mouth at bedtime.  Dispense: 30 tablet; Refill: 0  2. Upper respiratory tract infection, unspecified type Worsening symptoms that have not responded to OTC medications. Will give augmentin as below. Continue allergy medications. Stay well hydrated and get plenty of rest. Call if no symptom improvement or if symptoms worsen. - doxycycline (VIBRA-TABS) 100 MG tablet; Take 1 tablet (100 mg total) by mouth 2 (two) times daily.  Dispense: 14 tablet; Refill: 0       Margaretann Loveless, PA-C  [facility]

## 2016-12-02 ENCOUNTER — Telehealth: Payer: Self-pay

## 2016-12-02 DIAGNOSIS — F321 Major depressive disorder, single episode, moderate: Secondary | ICD-10-CM

## 2016-12-02 MED ORDER — SERTRALINE HCL 100 MG PO TABS: 100.0000 mg | ORAL_TABLET | Freq: Every day | ORAL | 3 refills | Status: DC

## 2016-12-02 NOTE — Telephone Encounter (Signed)
Patient advised as below. Patient verbalizes understanding and is in agreement with treatment plan.  

## 2016-12-02 NOTE — Telephone Encounter (Signed)
Patient is requesting to increase her Zoloft. Patient reports that she has a F/U on 12/09/16. Please advise.

## 2016-12-02 NOTE — Telephone Encounter (Signed)
May increase to 100mg . Start taking 2 pills daily of what she has left then I will send in new [facility] to [facility].

## 2016-12-09 ENCOUNTER — Ambulatory Visit: Payer: Medicare Other | Admitting: Physician Assistant

## 2016-12-26 ENCOUNTER — Other Ambulatory Visit: Payer: Self-pay | Admitting: Physician Assistant

## 2016-12-26 DIAGNOSIS — F419 Anxiety disorder, unspecified: Secondary | ICD-10-CM

## 2017-01-13 ENCOUNTER — Telehealth: Payer: Self-pay | Admitting: Physician Assistant

## 2017-01-13 NOTE — Telephone Encounter (Signed)
So I just received a medication review for her. She is refilling citalopram and sertraline. She is only supposed to be taking sertraline per my records. Citalopram was discontinued due to ineffectiveness. Can we verify she is only taking sertraline?

## 2017-01-13 NOTE — Telephone Encounter (Signed)
Pt reports the pharmacy refilled the prescription, and pt was unaware until she got home. Pt threw the citalopram in the commode. Pt is not able to tolerate the 100 mg of sertraline, and is requesting to refill sertraline 50 mg. Pt is hoping to taper this down and eventually D/C the Zoloft. [facility]. Allene Dillon, CMA

## 2017-01-13 NOTE — Telephone Encounter (Signed)
Ok to cut current sertraline in half to get the .

## 2017-01-14 NOTE — Telephone Encounter (Signed)
Patient is confused of medication. She reports that she already takes 50mg  of the Zoloft. Called the pharmacy to verify on the Citalopram per pharmacist patient had it filled and has refills left. Told pharmacist to DC the  Citalopram. Patient also requested a refill on the Xanax. Per the pharmacist she has 1 more refill and that she is due until 01/25/2017. Explained to patient that Antony ContrasJenni advised to cut Sertraline to 50mg  and she reports that she already does and still makes her feel dizzy. Per Antony ContrasJenni patient needs an appointment and to bring all her medication.  Patient advised and is scheduled for Monday May 7th.  Thanks,  -Gisselle Galvis

## 2017-01-18 ENCOUNTER — Encounter: Payer: Self-pay | Admitting: Physician Assistant

## 2017-01-18 ENCOUNTER — Ambulatory Visit (INDEPENDENT_AMBULATORY_CARE_PROVIDER_SITE_OTHER): Payer: Medicare Other | Admitting: Physician Assistant

## 2017-01-18 VITALS — BP 138/60 | HR 82 | Temp 98.2°F | Resp 16 | Wt 121.4 lb

## 2017-01-18 DIAGNOSIS — F321 Major depressive disorder, single episode, moderate: Secondary | ICD-10-CM

## 2017-01-18 MED ORDER — ESCITALOPRAM OXALATE 5 MG PO TABS: 5.0000 mg | ORAL_TABLET | Freq: Every day | ORAL | 0 refills | Status: DC

## 2017-01-18 NOTE — Progress Notes (Signed)
Patient: Molly Leonard Female    DOB: 1946/09/07   71 y.o.   MRN: [id number] Visit Date: 01/18/2017  Today's Provider: Margaretann Loveless, PA-C   Chief Complaint  Patient presents with  . Medication Problem   Subjective:    HPI patient is here to discuss medications. She reports that the only medicines that she is taking is Alprazolam 1 MG and Zoloft 100 mg. She reports that she cuts the Zoloft in half because she feels is too strong for her. Side effect that patient reports from the Zoloft is dizziness, "not in control of what she needs to do", bad temper and a lot of stress. Feels her stress is from work. She report that she was taking Cymbalta ans Wellbutrin before and that she still has some Cymbalta left at home and Citalopram. She didn't bring either of his medications with her today. She reports she has the full bottle of Citalopram in the car. Reports the first bottle of Citalopram she disposed in the comode. Citalopram caused chest pain and palpitations. She reports she wants to talk to provider if it is possible that if the Zoloft is not working for her that she wants to go back to the Cymbalta.  Depression screen [facility] 2/9 01/18/2017 11/11/2016 08/27/2016  Decreased Interest 1 1 3   Down, Depressed, Hopeless 3 3 3   PHQ - 2 Score 4 4 6   Altered sleeping 0 0 0  Tired, decreased energy 3 0 3  Change in appetite 3 0 3  Feeling bad or failure about yourself  0 0 3  Trouble concentrating 3 0 0  Moving slowly or fidgety/restless 0 1 3  Suicidal thoughts 3 0 3  PHQ-9 Score 16 5 21   Difficult doing work/chores Somewhat difficult Somewhat difficult -       No Known Allergies   Current Outpatient Prescriptions:  .  albuterol (VENTOLIN HFA) 108 (90 Base) MCG/ACT inhaler, INHALE 2 PUFFS EVERY 4 HOURS AS NEEDED FOR SHORTNESS OF BREATH, Disp: 18 g, Rfl: 5 .  ALPRAZolam (XANAX) 1 MG tablet, TAKE ONE TABLET 3 TIMES DAILY, Disp: 90 tablet, Rfl: 1 .  sertraline (ZOLOFT) 100 MG tablet,  Take 1 tablet (100 mg total) by mouth daily., Disp: 30 tablet, Rfl: 3 .  doxycycline (VIBRA-TABS) 100 MG tablet, Take 1 tablet (100 mg total) by mouth 2 (two) times daily. (Patient not taking: Reported on 01/18/2017), Disp: 14 tablet, Rfl: 0 .  nystatin (MYCOSTATIN) 100000 UNIT/ML suspension, Take 5mL PO swish and spit every 8 hrs prn (Patient not taking: Reported on 01/18/2017), Disp: 60 mL, Rfl: 0 .  SYMBICORT 160-4.5 MCG/ACT inhaler, TAKE 2 PUFFS TWICE A DAY (Patient not taking: Reported on 01/18/2017), Disp: 10.2 g, Rfl: 5  Review of Systems  Constitutional: Positive for appetite change and fatigue. Negative for chills and fever.  Respiratory: Negative for cough, chest tightness and shortness of breath.   Cardiovascular: Negative for chest pain, palpitations and leg swelling.  Gastrointestinal: Negative for abdominal pain.  Neurological: Positive for dizziness. Negative for weakness.  Psychiatric/Behavioral: Positive for agitation, decreased concentration and dysphoric mood. Negative for self-injury and suicidal ideas. The patient is nervous/anxious.     Social History  Substance Use Topics  . Smoking status: Current Every Day Smoker    Packs/day: 1.00  . Smokeless tobacco: Never Used  . Alcohol use No   Objective:   BP 138/60 (BP Location: Right Arm, Patient Position: Sitting, Cuff Size: Normal)  Pulse 82   Temp 98.2 F (36.8 C) (Oral)   Resp 16   Wt 121 lb 6.4 oz (55.1 kg)   BMI 19.01 kg/m    Physical Exam  Constitutional: She appears well-developed and well-nourished. No distress.  Neck: Normal range of motion. Neck supple.  Cardiovascular: Normal rate, regular rhythm and normal heart sounds.  Exam reveals no gallop and no friction rub.   No murmur heard. Pulmonary/Chest: Effort normal and breath sounds normal. No respiratory distress. She has no wheezes. She has no rales.  Skin: She is not diaphoretic.  Psychiatric: Her speech is normal and behavior is normal. Judgment and  thought content normal. Her mood appears anxious. Cognition and memory are normal. She exhibits a depressed mood. She expresses no suicidal plans and no homicidal plans.  Vitals reviewed.       Assessment & Plan:     1. Depression, major, single episode, moderate (HCC) Increased stress with work, busy time of year. Medicines not controlling symptoms. Will d/c sertraline due to adverse effects of dizziness. Has tried celexa (citalopram) and had chest pain and palpitations on the 40mg . Previously tolerated cymbalta (duloxetine) with wellbutrin but had become ineffective over time. Will start lexapro as below. Continue xanax prn. I will see her back in 4 weeks to see how she is tolerating.  - escitalopram (LEXAPRO) 5 MG tablet; Take 1 tablet (5 mg total) by mouth daily.  Dispense: 30 tablet; Refill: 0       Margaretann LovelessJennifer M Chalene Treu, PA-C  [facility]

## 2017-01-18 NOTE — Patient Instructions (Signed)
Escitalopram tablets What is this medicine? ESCITALOPRAM (es sye TAL oh pram) is used to treat depression and certain types of anxiety. This medicine may be used for other purposes; ask your health care provider or pharmacist if you have questions. COMMON BRAND NAME(S): Lexapro What should I tell my health care provider before I take this medicine? They need to know if you have any of these conditions: -bipolar disorder or a family history of bipolar disorder -diabetes -glaucoma -heart disease -kidney or liver disease -receiving electroconvulsive therapy -seizures (convulsions) -suicidal thoughts, plans, or attempt by you or a family member -an unusual or allergic reaction to escitalopram, the related drug citalopram, other medicines, foods, dyes, or preservatives -pregnant or trying to become pregnant -breast-feeding How should I use this medicine? Take this medicine by mouth with a glass of water. Follow the directions on the prescription label. You can take it with or without food. If it upsets your stomach, take it with food. Take your medicine at regular intervals. Do not take it more often than directed. Do not stop taking this medicine suddenly except upon the advice of your doctor. Stopping this medicine too quickly may cause serious side effects or your condition may worsen. A special MedGuide will be given to you by the pharmacist with each prescription and refill. Be sure to read this information carefully each time. Talk to your pediatrician regarding the use of this medicine in children. Special care may be needed. Overdosage: If you think you have taken too much of this medicine contact a poison control center or emergency room at once. NOTE: This medicine is only for you. Do not share this medicine with others. What if I miss a dose? If you miss a dose, take it as soon as you can. If it is almost time for your next dose, take only that dose. Do not take double or extra  doses. What may interact with this medicine? Do not take this medicine with any of the following medications: -certain medicines for fungal infections like fluconazole, itraconazole, ketoconazole, posaconazole, voriconazole -cisapride -citalopram -dofetilide -dronedarone -linezolid -MAOIs like Carbex, Eldepryl, Marplan, Nardil, and Parnate -methylene blue (injected into a vein) -pimozide -thioridazine -ziprasidone This medicine may also interact with the following medications: -alcohol -amphetamines -aspirin and aspirin-like medicines -carbamazepine -certain medicines for depression, anxiety, or psychotic disturbances -certain medicines for migraine headache like almotriptan, eletriptan, frovatriptan, naratriptan, rizatriptan, sumatriptan, zolmitriptan -certain medicines for sleep -certain medicines that treat or prevent blood clots like warfarin, enoxaparin, dalteparin -cimetidine -diuretics -fentanyl -furazolidone -isoniazid -lithium -metoprolol -NSAIDs, medicines for pain and inflammation, like ibuprofen or naproxen -other medicines that prolong the QT interval (cause an abnormal heart rhythm) -procarbazine -rasagiline -supplements like St. John's wort, kava kava, valerian -tramadol -tryptophan This list may not describe all possible interactions. Give your health care provider a list of all the medicines, herbs, non-prescription drugs, or dietary supplements you use. Also tell them if you smoke, drink alcohol, or use illegal drugs. Some items may interact with your medicine. What should I watch for while using this medicine? Tell your doctor if your symptoms do not get better or if they get worse. Visit your doctor or health care professional for regular checks on your progress. Because it may take several weeks to see the full effects of this medicine, it is important to continue your treatment as prescribed by your doctor. Patients and their families should watch out for  new or worsening thoughts of suicide or depression. Also watch out for   sudden changes in feelings such as feeling anxious, agitated, panicky, irritable, hostile, aggressive, impulsive, severely restless, overly excited and hyperactive, or not being able to sleep. If this happens, especially at the beginning of treatment or after a change in dose, call your health care professional. You may get drowsy or dizzy. Do not drive, use machinery, or do anything that needs mental alertness until you know how this medicine affects you. Do not stand or sit up quickly, especially if you are an older patient. This reduces the risk of dizzy or fainting spells. Alcohol may interfere with the effect of this medicine. Avoid alcoholic drinks. Your mouth may get dry. Chewing sugarless gum or sucking hard candy, and drinking plenty of water may help. Contact your doctor if the problem does not go away or is severe. What side effects may I notice from receiving this medicine? Side effects that you should report to your doctor or health care professional as soon as possible: -allergic reactions like skin rash, itching or hives, swelling of the face, lips, or tongue -anxious -black, tarry stools -changes in vision -confusion -elevated mood, decreased need for sleep, racing thoughts, impulsive behavior -eye pain -fast, irregular heartbeat -feeling faint or lightheaded, falls -feeling agitated, angry, or irritable -hallucination, loss of contact with reality -loss of balance or coordination -loss of memory -painful or prolonged erections -restlessness, pacing, inability to keep still -seizures -stiff muscles -suicidal thoughts or other mood changes -trouble sleeping -unusual bleeding or bruising -unusually weak or tired -vomiting Side effects that usually do not require medical attention (report to your doctor or health care professional if they continue or are bothersome): -changes in appetite -change in sex  drive or performance -headache -increased sweating -indigestion, nausea -tremors This list may not describe all possible side effects. Call your doctor for medical advice about side effects. You may report side effects to FDA at 1-800-FDA-1088. Where should I keep my medicine? Keep out of reach of children. Store at room temperature between 15 and 30 degrees C (59 and 86 degrees F). Throw away any unused medicine after the expiration date. NOTE: This sheet is a summary. It may not cover all possible information. If you have questions about this medicine, talk to your doctor, pharmacist, or health care provider.  2018 Elsevier/Gold Standard (2016-02-03 13:20:23)  

## 2017-02-17 ENCOUNTER — Encounter: Payer: Self-pay | Admitting: Physician Assistant

## 2017-02-17 ENCOUNTER — Ambulatory Visit (INDEPENDENT_AMBULATORY_CARE_PROVIDER_SITE_OTHER): Payer: Medicare Other | Admitting: Physician Assistant

## 2017-02-17 VITALS — BP 130/70 | HR 89 | Temp 97.9°F | Resp 16 | Wt 122.0 lb

## 2017-02-17 DIAGNOSIS — F324 Major depressive disorder, single episode, in partial remission: Secondary | ICD-10-CM

## 2017-02-17 MED ORDER — CITALOPRAM HYDROBROMIDE 10 MG PO TABS: 5.0000 mg | ORAL_TABLET | Freq: Every day | ORAL | 1 refills | Status: DC

## 2017-02-17 NOTE — Progress Notes (Signed)
Patient: Molly Leonard Female    DOB: 1946/08/17   71 y.o.   MRN: [id number] Visit Date: 02/17/2017  Today's Provider: Margaretann Loveless, PA-C   Chief Complaint  Patient presents with  . Follow-up    Depression   Subjective:    HPI Patient is here today to follow-up Depression. She reports she is feeling great. She reports that she only took 3 pills of the Escitalopram(Lexapro) and didn't help. She reports that since around 05/14 she re-started the Citalopram(Celexa) 20mg . She reports that she breaks the 20 mg in half and into another half to make 5 mg. She takes it in the morning. Denies feeling depressed, no more crying episodes. She reports feeling 80% better.      No Known Allergies   Current Outpatient Prescriptions:  .  ALPRAZolam (XANAX) 1 MG tablet, TAKE ONE TABLET 3 TIMES DAILY, Disp: 90 tablet, Rfl: 1 .  albuterol (VENTOLIN HFA) 108 (90 Base) MCG/ACT inhaler, INHALE 2 PUFFS EVERY 4 HOURS AS NEEDED FOR SHORTNESS OF BREATH (Patient not taking: Reported on 02/17/2017), Disp: 18 g, Rfl: 5 .  citalopram (CELEXA) 20 MG tablet, , Disp: , Rfl:  .  doxycycline (VIBRA-TABS) 100 MG tablet, Take 1 tablet (100 mg total) by mouth 2 (two) times daily. (Patient not taking: Reported on 01/18/2017), Disp: 14 tablet, Rfl: 0 .  escitalopram (LEXAPRO) 5 MG tablet, Take 1 tablet (5 mg total) by mouth daily. (Patient not taking: Reported on 02/17/2017), Disp: 30 tablet, Rfl: 0 .  nystatin (MYCOSTATIN) 100000 UNIT/ML suspension, Take 5mL PO swish and spit every 8 hrs prn (Patient not taking: Reported on 01/18/2017), Disp: 60 mL, Rfl: 0 .  SYMBICORT 160-4.5 MCG/ACT inhaler, TAKE 2 PUFFS TWICE A DAY (Patient not taking: Reported on 01/18/2017), Disp: 10.2 g, Rfl: 5  Review of Systems  Constitutional: Negative for fatigue.  Respiratory: Negative for cough, chest tightness and shortness of breath.   Cardiovascular: Negative for chest pain, palpitations and leg swelling.  Gastrointestinal: Negative for  abdominal pain.  Psychiatric/Behavioral: Negative for decreased concentration, dysphoric mood, self-injury, sleep disturbance and suicidal ideas. The patient is not nervous/anxious.     Social History  Substance Use Topics  . Smoking status: Current Every Day Smoker    Packs/day: 1.00  . Smokeless tobacco: Never Used  . Alcohol use No   Objective:   BP 130/70 (BP Location: Right Arm, Patient Position: Sitting, Cuff Size: Normal)   Pulse 89   Temp 97.9 F (36.6 C) (Oral)   Resp 16   Wt 122 lb (55.3 kg)   BMI 19.11 kg/m  Vitals:   02/17/17 0814  BP: 130/70  Pulse: 89  Resp: 16  Temp: 97.9 F (36.6 C)  TempSrc: Oral  Weight: 122 lb (55.3 kg)     Physical Exam  Constitutional: She appears well-developed and well-nourished. No distress.  Neck: Normal range of motion. Neck supple.  Cardiovascular: Normal rate, regular rhythm and normal heart sounds.  Exam reveals no gallop and no friction rub.   No murmur heard. Pulmonary/Chest: Effort normal and breath sounds normal. No respiratory distress. She has no wheezes. She has no rales.  Skin: She is not diaphoretic.  Psychiatric: She has a normal mood and affect. Her behavior is normal. Judgment and thought content normal.  Vitals reviewed.       Assessment & Plan:     1. Depression, major, single episode, in partial remission (HCC) Improved. Will continue citalopram 5mg  daily.  She will call when her work slows down for her CPE. She is to call if she has worsening symptoms.  - citalopram (CELEXA) 10 MG tablet; Take 0.5 tablets (5 mg total) by mouth daily.  Dispense: 45 tablet; Refill: 1       Margaretann LovelessJennifer M Burnette, PA-C  [facility]

## 2017-02-17 NOTE — Patient Instructions (Signed)
10 Relaxation Techniques That Zap Stress Fast By Jeannette Moninger   Listen  Relax. You deserve it, it's good for you, and it takes less time than you think. You don't need a spa weekend or a retreat. Each of these stress-relieving tips can get you from OMG to om in less than 15 minutes. 1. Meditate  A few minutes of practice per day can help ease anxiety. "Research suggests that daily meditation may alter the brain's neural pathways, making you more resilient to stress," says psychologist Robbie Maller Hartman, PhD, a [place] health and wellness coach. It's simple. Sit up straight with both feet on the floor. Close your eyes. Focus your attention on reciting -- out loud or silently -- a positive mantra such as "I feel at peace" or "I love myself." Place one hand on your belly to sync the mantra with your breaths. Let any distracting thoughts float by like clouds. 2. Breathe Deeply  Take a 5-minute break and focus on your breathing. Sit up straight, eyes closed, with a hand on your belly. Slowly inhale through your nose, feeling the breath start in your abdomen and work its way to the top of your head. Reverse the process as you exhale through your mouth.  "Deep breathing counters the effects of stress by slowing the heart rate and lowering blood pressure," psychologist Judith Tutin, PhD, says. She's a certified life coach in [place], [place] 3. Be Present  Slow down.  "Take 5 minutes and focus on only one behavior with awareness," Tutin says. Notice how the air feels on your face when you're walking and how your feet feel hitting the ground. Enjoy the texture and taste of each bite of food. When you spend time in the moment and focus on your senses, you should feel less tense. 4. Reach Out  Your social network is one of your best tools for handling stress. Talk to others -- preferably face to face, or at least on the phone. Share what's going on. You can get a fresh perspective while keeping your  connection strong. 5. Tune In to Your Body  Mentally scan your body to get a sense of how stress affects it each day. Lie on your back, or sit with your feet on the floor. Start at your toes and work your way up to your scalp, noticing how your body feels.  10 Relaxation Techniques That Zap Stress Fast By Jeannette Moninger   Listen  "Simply be aware of places you feel tight or loose without trying to change anything," Tutin says. For 1 to 2 minutes, imagine each deep breath flowing to that body part. Repeat this process as you move your focus up your body, paying close attention to sensations you feel in each body part. 6. Decompress  Place a warm heat wrap around your neck and shoulders for 10 minutes. Close your eyes and relax your face, neck, upper chest, and back muscles. Remove the wrap, and use a tennis ball or foam roller to massage away tension.  "Place the ball between your back and the wall. Lean into the ball, and hold gentle pressure for up to 15 seconds. Then move the ball to another spot, and apply pressure," says Cathy Benninger, a nurse practitioner and assistant professor at [employer] [facility] in [place]. 7. Laugh Out Loud  A good belly laugh doesn't just lighten the load mentally. It lowers cortisol, your body's stress hormone, and boosts brain chemicals called endorphins, which help   your mood. Lighten up by tuning in to your favorite sitcom or video, reading the comics, or chatting with someone who makes you smile. 8. Crank Up the Tunes  Research shows that listening to soothing music can lower blood pressure, heart rate, and anxiety. "Create a playlist of songs or nature sounds (the ocean, a bubbling brook, birds chirping), and allow your mind to focus on the different melodies, instruments, or singers in the piece," Benninger says. You also can blow off steam by rocking out to more upbeat tunes -- or singing at the top of your lungs! 9. Get Moving   You don't have to run in order to get a runner's high. All forms of exercise, including yoga and walking, can ease depression and anxiety by helping the brain release feel-good chemicals and by giving your body a chance to practice dealing with stress. You can go for a quick walk around the block, take the stairs up and down a few flights, or do some stretching exercises like head rolls and shoulder shrugs. 10. Be Grateful  Keep a gratitude journal or several (one by your bed, one in your purse, and one at work) to help you remember all the things that are good in your life.  "Being grateful for your blessings cancels out negative thoughts and worries," says Joni Emmerling, a wellness coach in [place], [place].  Use these journals to savor good experiences like a child's smile, a sunshine-filled day, and good health. Don't forget to celebrate accomplishments like mastering a new task at work or a new hobby. When you start feeling stressed, spend a few minutes looking through your notes to remind yourself what really matters.  

## 2017-03-06 ENCOUNTER — Other Ambulatory Visit: Payer: Self-pay | Admitting: Physician Assistant

## 2017-03-06 DIAGNOSIS — F419 Anxiety disorder, unspecified: Secondary | ICD-10-CM

## 2017-03-08 NOTE — Telephone Encounter (Signed)
Called into [facility] 

## 2017-04-26 ENCOUNTER — Other Ambulatory Visit: Payer: Self-pay | Admitting: Physician Assistant

## 2017-04-26 DIAGNOSIS — F324 Major depressive disorder, single episode, in partial remission: Secondary | ICD-10-CM

## 2017-04-27 MED ORDER — CITALOPRAM HYDROBROMIDE 10 MG PO TABS: 5.0000 mg | ORAL_TABLET | Freq: Every day | ORAL | 1 refills | Status: DC

## 2017-04-27 NOTE — Addendum Note (Signed)
Addended by: Margaretann LovelessBURNETTE, JENNIFER M on: 04/27/2017 07:45 AM   Modules accepted: Orders

## 2017-09-09 ENCOUNTER — Other Ambulatory Visit: Payer: Self-pay | Admitting: Physician Assistant

## 2017-09-09 DIAGNOSIS — F419 Anxiety disorder, unspecified: Secondary | ICD-10-CM

## 2017-09-09 NOTE — Telephone Encounter (Signed)
RX called in at [facility]  

## 2017-12-20 ENCOUNTER — Ambulatory Visit (INDEPENDENT_AMBULATORY_CARE_PROVIDER_SITE_OTHER): Payer: Medicare Other

## 2017-12-20 ENCOUNTER — Ambulatory Visit (INDEPENDENT_AMBULATORY_CARE_PROVIDER_SITE_OTHER): Payer: Medicare Other | Admitting: Physician Assistant

## 2017-12-20 ENCOUNTER — Encounter: Payer: Self-pay | Admitting: Physician Assistant

## 2017-12-20 VITALS — BP 128/62 | HR 84 | Temp 98.6°F | Ht 67.0 in | Wt 132.6 lb

## 2017-12-20 DIAGNOSIS — J418 Mixed simple and mucopurulent chronic bronchitis: Secondary | ICD-10-CM

## 2017-12-20 DIAGNOSIS — Z1159 Encounter for screening for other viral diseases: Secondary | ICD-10-CM

## 2017-12-20 DIAGNOSIS — F3289 Other specified depressive episodes: Secondary | ICD-10-CM | POA: Diagnosis not present

## 2017-12-20 DIAGNOSIS — Z1239 Encounter for other screening for malignant neoplasm of breast: Secondary | ICD-10-CM

## 2017-12-20 DIAGNOSIS — F419 Anxiety disorder, unspecified: Secondary | ICD-10-CM | POA: Diagnosis not present

## 2017-12-20 DIAGNOSIS — Z720 Tobacco use: Secondary | ICD-10-CM

## 2017-12-20 DIAGNOSIS — Z0000 Encounter for general adult medical examination without abnormal findings: Secondary | ICD-10-CM | POA: Diagnosis not present

## 2017-12-20 DIAGNOSIS — E7800 Pure hypercholesterolemia, unspecified: Secondary | ICD-10-CM

## 2017-12-20 DIAGNOSIS — Z1231 Encounter for screening mammogram for malignant neoplasm of breast: Secondary | ICD-10-CM | POA: Diagnosis not present

## 2017-12-20 NOTE — Patient Instructions (Addendum)
Steps to Quit Smoking Smoking tobacco can be harmful to your health and can affect almost every organ in your body. Smoking puts you, and those around you, at risk for developing many serious chronic diseases. Quitting smoking is difficult, but it is one of the best things that you can do for your health. It is never too late to quit. What are the benefits of quitting smoking? When you quit smoking, you lower your risk of developing serious diseases and conditions, such as:  Lung cancer or lung disease, such as COPD.  Heart disease.  Stroke.  Heart attack.  Infertility.  Osteoporosis and bone fractures.  Additionally, symptoms such as coughing, wheezing, and shortness of breath may get better when you quit. You may also find that you get sick less often because your body is stronger at fighting off colds and infections. If you are pregnant, quitting smoking can help to reduce your chances of having a baby of low birth weight. How do I get ready to quit? When you decide to quit smoking, create a plan to make sure that you are successful. Before you quit:  Pick a date to quit. Set a date within the next two weeks to give you time to prepare.  Write down the reasons why you are quitting. Keep this list in places where you will see it often, such as on your bathroom mirror or in your car or wallet.  Identify the people, places, things, and activities that make you want to smoke (triggers) and avoid them. Make sure to take these actions: ? Throw away all cigarettes at home, at work, and in your car. ? Throw away smoking accessories, such as Scientist, research (medical). ? Clean your car and make sure to empty the ashtray. ? Clean your home, including curtains and carpets.  Tell your family, friends, and coworkers that you are quitting. Support from your loved ones can make quitting easier.  Talk with your health care provider about your options for quitting smoking.  Find out what treatment  options are covered by your health insurance.  What strategies can I use to quit smoking? Talk with your healthcare provider about different strategies to quit smoking. Some strategies include:  Quitting smoking altogether instead of gradually lessening how much you smoke over a period of time. Research shows that quitting "cold [place]" is more successful than gradually quitting.  Attending in-person counseling to help you build problem-solving skills. You are more likely to have success in quitting if you attend several counseling sessions. Even short sessions of 10 minutes can be effective.  Finding resources and support systems that can help you to quit smoking and remain smoke-free after you quit. These resources are most helpful when you use them often. They can include: ? Online chats with a Social worker. ? Telephone quitlines. ? Careers information officer. ? Support groups or group counseling. ? Text messaging programs. ? Mobile phone applications.  Taking medicines to help you quit smoking. (If you are pregnant or breastfeeding, talk with your health care provider first.) Some medicines contain nicotine and some do not. Both types of medicines help with cravings, but the medicines that include nicotine help to relieve withdrawal symptoms. Your health care provider may recommend: ? Nicotine patches, gum, or lozenges. ? Nicotine inhalers or sprays. ? Non-nicotine medicine that is taken by mouth.  Talk with your health care provider about combining strategies, such as taking medicines while you are also receiving in-person counseling. Using these two strategies together  makes you more likely to succeed in quitting than if you used either strategy on its own. If you are pregnant or breastfeeding, talk with your health care provider about finding counseling or other support strategies to quit smoking. Do not take medicine to help you quit smoking unless told to do so by your health care  provider. What things can I do to make it easier to quit? Quitting smoking might feel overwhelming at first, but there is a lot that you can do to make it easier. Take these important actions:  Reach out to your family and friends and ask that they support and encourage you during this time. Call telephone quitlines, reach out to support groups, or work with a counselor for support.  Ask people who smoke to avoid smoking around you.  Avoid places that trigger you to smoke, such as bars, parties, or smoke-break areas at work.  Spend time around people who do not smoke.  Lessen stress in your life, because stress can be a smoking trigger for some people. To lessen stress, try: ? Exercising regularly. ? Deep-breathing exercises. ? Yoga. ? Meditating. ? Performing a body scan. This involves closing your eyes, scanning your body from head to toe, and noticing which parts of your body are particularly tense. Purposefully relax the muscles in those areas.  Download or purchase mobile phone or tablet apps (applications) that can help you stick to your quit plan by providing reminders, tips, and encouragement. There are many free apps, such as QuitGuide from the [employer] Office manager for Disease Control and Prevention). You can find other support for quitting smoking (smoking cessation) through smokefree.gov and other websites.  How will I feel when I quit smoking? Within the first 24 hours of quitting smoking, you may start to feel some withdrawal symptoms. These symptoms are usually most noticeable 2-3 days after quitting, but they usually do not last beyond 2-3 weeks. Changes or symptoms that you might experience include:  Mood swings.  Restlessness, anxiety, or irritation.  Difficulty concentrating.  Dizziness.  Strong cravings for sugary foods in addition to nicotine.  Mild weight gain.  Constipation.  Nausea.  Coughing or a sore throat.  Changes in how your medicines work in your  body.  A depressed mood.  Difficulty sleeping (insomnia).  After the first 2-3 weeks of quitting, you may start to notice more positive results, such as:  Improved sense of smell and taste.  Decreased coughing and sore throat.  Slower heart rate.  Lower blood pressure.  Clearer skin.  The ability to breathe more easily.  Fewer sick days.  Quitting smoking is very challenging for most people. Do not get discouraged if you are not successful the first time. Some people need to make many attempts to quit before they achieve long-term success. Do your best to stick to your quit plan, and talk with your health care provider if you have any questions or concerns. This information is not intended to replace advice given to you by your health care provider. Make sure you discuss any questions you have with your health care provider. Document Released: 08/25/2001 Document Revised: 04/28/2016 Document Reviewed: 01/15/2015 Elsevier Interactive Patient Education  2018 [place] Maintenance for Postmenopausal Women Menopause is a normal process in which your reproductive ability comes to an end. This process happens gradually over a span of months to years, usually between the ages of 81 and 36. Menopause is complete when you have missed 12 consecutive menstrual periods. It  is important to talk with your health care provider about some of the most common conditions that affect postmenopausal women, such as heart disease, cancer, and bone loss (osteoporosis). Adopting a healthy lifestyle and getting preventive care can help to promote your health and wellness. Those actions can also lower your chances of developing some of these common conditions. What should I know about menopause? During menopause, you may experience a number of symptoms, such as:  Moderate-to-severe hot flashes.  Night sweats.  Decrease in sex drive.  Mood  swings.  Headaches.  Tiredness.  Irritability.  Memory problems.  Insomnia.  Choosing to treat or not to treat menopausal changes is an individual decision that you make with your health care provider. What should I know about hormone replacement therapy and supplements? Hormone therapy products are effective for treating symptoms that are associated with menopause, such as hot flashes and night sweats. Hormone replacement carries certain risks, especially as you become older. If you are thinking about using estrogen or estrogen with progestin treatments, discuss the benefits and risks with your health care provider. What should I know about heart disease and stroke? Heart disease, heart attack, and stroke become more likely as you age. This may be due, in part, to the hormonal changes that your body experiences during menopause. These can affect how your body processes dietary fats, triglycerides, and cholesterol. Heart attack and stroke are both medical emergencies. There are many things that you can do to help prevent heart disease and stroke:  Have your blood pressure checked at least every 1-2 years. High blood pressure causes heart disease and increases the risk of stroke.  If you are 21-54 years old, ask your health care provider if you should take aspirin to prevent a heart attack or a stroke.  Do not use any tobacco products, including cigarettes, chewing tobacco, or electronic cigarettes. If you need help quitting, ask your health care provider.  It is important to eat a healthy diet and maintain a healthy weight. ? Be sure to include plenty of vegetables, fruits, low-fat dairy products, and lean protein. ? Avoid eating foods that are high in solid fats, added sugars, or salt (sodium).  Get regular exercise. This is one of the most important things that you can do for your health. ? Try to exercise for at least 150 minutes each week. The type of exercise that you do should  increase your heart rate and make you sweat. This is known as moderate-intensity exercise. ? Try to do strengthening exercises at least twice each week. Do these in addition to the moderate-intensity exercise.  Know your numbers.Ask your health care provider to check your cholesterol and your blood glucose. Continue to have your blood tested as directed by your health care provider.  What should I know about cancer screening? There are several types of cancer. Take the following steps to reduce your risk and to catch any cancer development as early as possible. Breast Cancer  Practice breast self-awareness. ? This means understanding how your breasts normally appear and feel. ? It also means doing regular breast self-exams. Let your health care provider know about any changes, no matter how small.  If you are 49 or older, have a clinician do a breast exam (clinical breast exam or CBE) every year. Depending on your age, family history, and medical history, it may be recommended that you also have a yearly breast X-ray (mammogram).  If you have a family history of breast cancer, talk  with your health care provider about genetic screening.  If you are at high risk for breast cancer, talk with your health care provider about having an MRI and a mammogram every year.  Breast cancer (BRCA) gene test is recommended for women who have family members with BRCA-related cancers. Results of the assessment will determine the need for genetic counseling and BRCA1 and for BRCA2 testing. BRCA-related cancers include these types: ? Breast. This occurs in males or females. ? Ovarian. ? Tubal. This may also be called fallopian tube cancer. ? Cancer of the abdominal or pelvic lining (peritoneal cancer). ? Prostate. ? Pancreatic.  Cervical, Uterine, and Ovarian Cancer Your health care provider may recommend that you be screened regularly for cancer of the pelvic organs. These include your ovaries, uterus,  and vagina. This screening involves a pelvic exam, which includes checking for microscopic changes to the surface of your cervix (Pap test).  For women ages 21-65, health care providers may recommend a pelvic exam and a Pap test every three years. For women ages 69-65, they may recommend the Pap test and pelvic exam, combined with testing for human papilloma virus (HPV), every five years. Some types of HPV increase your risk of cervical cancer. Testing for HPV may also be done on women of any age who have unclear Pap test results.  Other health care providers may not recommend any screening for nonpregnant women who are considered low risk for pelvic cancer and have no symptoms. Ask your health care provider if a screening pelvic exam is right for you.  If you have had past treatment for cervical cancer or a condition that could lead to cancer, you need Pap tests and screening for cancer for at least 20 years after your treatment. If Pap tests have been discontinued for you, your risk factors (such as having a new sexual partner) need to be reassessed to determine if you should start having screenings again. Some women have medical problems that increase the chance of getting cervical cancer. In these cases, your health care provider may recommend that you have screening and Pap tests more often.  If you have a family history of uterine cancer or ovarian cancer, talk with your health care provider about genetic screening.  If you have vaginal bleeding after reaching menopause, tell your health care provider.  There are currently no reliable tests available to screen for ovarian cancer.  Lung Cancer Lung cancer screening is recommended for adults 3-105 years old who are at high risk for lung cancer because of a history of smoking. A yearly low-dose CT scan of the lungs is recommended if you:  Currently smoke.  Have a history of at least 30 pack-years of smoking and you currently smoke or have quit  within the past 15 years. A pack-year is smoking an average of one pack of cigarettes per day for one year.  Yearly screening should:  Continue until it has been 15 years since you quit.  Stop if you develop a health problem that would prevent you from having lung cancer treatment.  Colorectal Cancer  This type of cancer can be detected and can often be prevented.  Routine colorectal cancer screening usually begins at age 58 and continues through age 82.  If you have risk factors for colon cancer, your health care provider may recommend that you be screened at an earlier age.  If you have a family history of colorectal cancer, talk with your health care provider about genetic screening.  Your health care provider may also recommend using home test kits to check for hidden blood in your stool.  A small camera at the end of a tube can be used to examine your colon directly (sigmoidoscopy or colonoscopy). This is done to check for the earliest forms of colorectal cancer.  Direct examination of the colon should be repeated every 5-10 years until age 82. However, if early forms of precancerous polyps or small growths are found or if you have a family history or genetic risk for colorectal cancer, you may need to be screened more often.  Skin Cancer  Check your skin from head to toe regularly.  Monitor any moles. Be sure to tell your health care provider: ? About any new moles or changes in moles, especially if there is a change in a mole's shape or color. ? If you have a mole that is larger than the size of a pencil eraser.  If any of your family members has a history of skin cancer, especially at a young age, talk with your health care provider about genetic screening.  Always use sunscreen. Apply sunscreen liberally and repeatedly throughout the day.  Whenever you are outside, protect yourself by wearing long sleeves, pants, a wide-brimmed hat, and sunglasses.  What should I know  about osteoporosis? Osteoporosis is a condition in which bone destruction happens more quickly than new bone creation. After menopause, you may be at an increased risk for osteoporosis. To help prevent osteoporosis or the bone fractures that can happen because of osteoporosis, the following is recommended:  If you are 71-40 years old, get at least 1,000 mg of calcium and at least 600 mg of vitamin D per day.  If you are older than age 13 but younger than age 70, get at least 1,200 mg of calcium and at least 600 mg of vitamin D per day.  If you are older than age 22, get at least 1,200 mg of calcium and at least 800 mg of vitamin D per day.  Smoking and excessive alcohol intake increase the risk of osteoporosis. Eat foods that are rich in calcium and vitamin D, and do weight-bearing exercises several times each week as directed by your health care provider. What should I know about how menopause affects my mental health? Depression may occur at any age, but it is more common as you become older. Common symptoms of depression include:  Low or sad mood.  Changes in sleep patterns.  Changes in appetite or eating patterns.  Feeling an overall lack of motivation or enjoyment of activities that you previously enjoyed.  Frequent crying spells.  Talk with your health care provider if you think that you are experiencing depression. What should I know about immunizations? It is important that you get and maintain your immunizations. These include:  Tetanus, diphtheria, and pertussis (Tdap) booster vaccine.  Influenza every year before the flu season begins.  Pneumonia vaccine.  Shingles vaccine.  Your health care provider may also recommend other immunizations. This information is not intended to replace advice given to you by your health care provider. Make sure you discuss any questions you have with your health care provider. Document Released: 10/23/2005 Document Revised: 03/20/2016  Document Reviewed: 06/04/2015 Elsevier Interactive Patient Education  2018 [employer].

## 2017-12-20 NOTE — Progress Notes (Signed)
Subjective:   Molly Leonard is a 72 y.o. female who presents for Medicare Annual (Subsequent) preventive examination.  Review of Systems:  N/A  Cardiac Risk Factors include: advanced age (>21men, >6 women);smoking/ tobacco exposure;dyslipidemia     Objective:     Vitals: BP 128/62 (BP Location: Left Arm)   Pulse 84   Temp 98.6 F (37 C) (Oral)   Ht 5\' 7"  (1.702 m)   Wt 132 lb 9.6 oz (60.1 kg)   BMI 20.77 kg/m   Body mass index is 20.77 kg/m.  Advanced Directives 12/20/2017 12/20/2017 01/18/2017 11/08/2016 12/04/2015 10/23/2015  Does Patient Have a Medical Advance Directive? No Yes Yes Yes Yes Yes  Type of Advance Directive - Living will Healthcare Power of [place];Living will - Living will;Healthcare Power of [employer] Power of [place];Living will  Does patient want to make changes to medical advance directive? No - Patient declined - - - - -    Tobacco Social History   Tobacco Use  Smoking Status Current Every Day Smoker  . Packs/day: 0.75  Smokeless Tobacco Never Used     Ready to quit: No Counseling given: Not Answered   Clinical Intake:  Pre-visit preparation completed: Yes  Pain : No/denies pain Pain Score: 0-No pain     Nutritional Status: BMI of 19-24  Normal Nutritional Risks: None Diabetes: No  How often do you need to have someone help you when you read instructions, pamphlets, or other written materials from your doctor or pharmacy?: 1 - Never  Interpreter Needed?: No  Information entered by :: [facility], LPN  Past Medical History:  Diagnosis Date  . Anxiety   . Depression    Past Surgical History:  Procedure Laterality Date  . ABDOMINAL HYSTERECTOMY  1973   Family History  Problem Relation Age of Onset  . Heart disease Mother   . Diabetes Mother   . Multiple sclerosis Sister   . Bone cancer Brother    Social History   Socioeconomic History  . Marital status: Married    Spouse name: Not on file  . Number of children: 3    . Years of education: Not on file  . Highest education level: 12th grade  Occupational History  . Occupation: Network engineer    Comment: full time  Social Needs  . Financial resource strain: Not hard at all  . Food insecurity:    Worry: Never true    Inability: Never true  . Transportation needs:    Medical: No    Non-medical: No  Tobacco Use  . Smoking status: Current Every Day Smoker    Packs/day: 0.75  . Smokeless tobacco: Never Used  Substance and Sexual Activity  . Alcohol use: No  . Drug use: No  . Sexual activity: Not on file  Lifestyle  . Physical activity:    Days per week: Not on file    Minutes per session: Not on file  . Stress: Very much  Relationships  . Social connections:    Talks on phone: Not on file    Gets together: Not on file    Attends religious service: Not on file    Active member of club or organization: Not on file    Attends meetings of clubs or organizations: Not on file    Relationship status: Not on file  Other Topics Concern  . Not on file  Social History Narrative  . Not on file    Outpatient Encounter Medications as of 12/20/2017  Medication Sig  . ALPRAZolam (XANAX) 1 MG tablet TAKE ONE TABLET BY MOUTH 3 TIMES DAILY AS NEEDED FOR ANXIETY  . citalopram (CELEXA) 10 MG tablet Take 0.5 tablets (5 mg total) by mouth daily.  Marland Kitchen albuterol (VENTOLIN HFA) 108 (90 Base) MCG/ACT inhaler INHALE 2 PUFFS EVERY 4 HOURS AS NEEDED FOR SHORTNESS OF BREATH (Patient not taking: Reported on 02/17/2017)  . SYMBICORT 160-4.5 MCG/ACT inhaler TAKE 2 PUFFS TWICE A DAY (Patient not taking: Reported on 01/18/2017)   No facility-administered encounter medications on file as of 12/20/2017.     Activities of Daily Living In your present state of health, do you have any difficulty performing the following activities: 12/20/2017  Hearing? N  Vision? N  Difficulty concentrating or making decisions? N  Walking or climbing stairs? N  Dressing or bathing? N  Doing errands,  shopping? N  Preparing Food and eating ? N  Using the Toilet? N  In the past six months, have you accidently leaked urine? Y  Comment Occasionally with pressure, wears protection.   Do you have problems with loss of bowel control? N  Managing your Medications? N  Managing your Finances? N  Housekeeping or managing your Housekeeping? N  Some recent data might be hidden    Patient Care Team: Reine Just as PCP - General (Family Medicine)    Assessment:   This is a routine wellness examination for Molly Leonard.  Exercise Activities and Dietary recommendations Current Exercise Habits: The patient does not participate in regular exercise at present, Exercise limited by: Other - see comments(Works a lot and does a lot of walking with current job.)  Goals    . DIET - INCREASE WATER INTAKE     Recommend to start drinking 3 glasses of water a day.         Fall Risk Fall Risk  12/20/2017 11/11/2016 10/23/2015  Falls in the past year? No No No   Is the patient's home free of loose throw rugs in walkways, pet beds, electrical cords, etc?   yes      Grab bars in the bathroom? no      Handrails on the stairs?   yes      Adequate lighting?   yes  Timed Get Up and Go performed: N/A  Depression Screen PHQ 2/9 Scores 12/20/2017 01/18/2017 11/11/2016 08/27/2016  PHQ - 2 Score 0 4 4 6   PHQ- 9 Score - 16 5 21      Cognitive Function: Pt declined screening today.         Immunization History  Administered Date(s) Administered  . Influenza Split 07/22/2016  . Influenza, High Dose Seasonal PF 07/12/2015, 06/16/2017  . Influenza-Unspecified 06/16/2017  . Pneumococcal Conjugate-13 10/06/2013  . Pneumococcal Polysaccharide-23 10/17/2014  . Tdap 06/17/2011    Qualifies for Shingles Vaccine? Due for Shingles vaccine. Declined my offer to administer today. Education has been provided regarding the importance of this vaccine. Pt has been advised to call her insurance company to determine her  out of pocket expense. Advised she may also receive this vaccine at her local pharmacy or Health Dept. Verbalized acceptance and understanding.  Screening Tests Health Maintenance  Topic Date Due  . Hepatitis C Screening  [birth year]  . MAMMOGRAM  10/21/2017  . INFLUENZA VACCINE  04/14/2018  . TETANUS/TDAP  06/16/2021  . COLONOSCOPY  10/17/2024  . DEXA SCAN  Completed  . PNA vac Low Risk Adult  Completed    Cancer Screenings: Lung: Low Dose CT  Chest recommended if Age 8-80 years, 30 pack-year currently smoking OR have quit w/in 15years. Patient does qualify. An Epic message has been sent to Glenna FellowsShawn Perkins, RN (Oncology Nurse Navigator) regarding the possible need for this exam. Ines BloomerShawn will review the patient's chart to determine if the patient truly qualifies for the exam. If the patient qualifies, Ines BloomerShawn will order the Low Dose CT of the chest to facilitate the scheduling of this exam. Breast:  Up to date on Mammogram? No, pt plans to set up apt this year.  Up to date of Bone Density/Dexa? Yes Colorectal: Up to date  Additional Screenings: Hepatitis C Screening: Pt declines today.      Plan:  I have personally reviewed and addressed the Medicare Annual Wellness questionnaire and have noted the following in the patient's chart:  A. Medical and social history B. Use of alcohol, tobacco or illicit drugs  C. Current medications and supplements D. Functional ability and status E.  Nutritional status F.  Physical activity G. Advance directives H. List of other physicians I.  Hospitalizations, surgeries, and ER visits in previous 12 months J.  Vitals K. Screenings such as hearing and vision if needed, cognitive and depression L. Referrals and appointments - none  In addition, I have reviewed and discussed with patient certain preventive protocols, quality metrics, and best practice recommendations. A written personalized care plan for preventive services as well as general preventive  health recommendations were provided to patient.  See attached scanned questionnaire for additional information.   Signed,  Hyacinth MeekerMckenzie Ziaire Bieser, LPN Nurse Health Advisor   Nurse Recommendations: Pt would like to speak with PCP regarding Hep C screening and coverage. Pt states she plans to set up her mammogram this year, ahe is unable to do so at this time. An Epic message was sent to Glenna FellowsShawn Perkins (nurse navigator) to check pts eligibility for receiving a chest CT scan due to smoking history.

## 2017-12-20 NOTE — Patient Instructions (Addendum)
[name] , Thank you for taking time to come for your Medicare Wellness Visit. I appreciate your ongoing commitment to your health goals. Please review the following plan we discussed and let me know if I can assist you in the future.   Screening recommendations/referrals: Colonoscopy: Up to date Mammogram: Pt declines today.  Bone Density: Up to date Recommended yearly ophthalmology/optometry visit for glaucoma screening and checkup Recommended yearly dental visit for hygiene and checkup  Vaccinations: Influenza vaccine: Up to date Pneumococcal vaccine: Up to date Tdap vaccine: Up to date Shingles vaccine: Pt declines today.     Advanced directives: Advance directive discussed with you today. Even though you declined this today please call our office should you change your mind and we can give you the proper paperwork for you to fill out.  Conditions/risks identified: Smoking cessation; Recommend to start drinking 3 glasses of water a day.    Next appointment: 10:00 AM with Molly Leonard.   Preventive Care 71 Years and Older, Female Preventive care refers to lifestyle choices and visits with your health care provider that can promote health and wellness. What does preventive care include?  A yearly physical exam. This is also called an annual well check.  Dental exams once or twice a year.  Routine eye exams. Ask your health care provider how often you should have your eyes checked.  Personal lifestyle choices, including:  Daily care of your teeth and gums.  Regular physical activity.  Eating a healthy diet.  Avoiding tobacco and drug use.  Limiting alcohol use.  Practicing safe sex.  Taking low-dose aspirin every day.  Taking vitamin and mineral supplements as recommended by your health care provider. What happens during an annual well check? The services and screenings done by your health care provider during your annual well check will depend on your age,  overall health, lifestyle risk factors, and family history of disease. Counseling  Your health care provider may ask you questions about your:  Alcohol use.  Tobacco use.  Drug use.  Emotional well-being.  Home and relationship well-being.  Sexual activity.  Eating habits.  History of falls.  Memory and ability to understand (cognition).  Work and work Astronomerenvironment.  Reproductive health. Screening  You may have the following tests or measurements:  Height, weight, and BMI.  Blood pressure.  Lipid and cholesterol levels. These may be checked every 5 years, or more frequently if you are over 71 years old.  Skin check.  Lung cancer screening. You may have this screening every year starting at age 71 if you have a 30-pack-year history of smoking and currently smoke or have quit within the past 15 years.  Fecal occult blood test (FOBT) of the stool. You may have this test every year starting at age 71.  Flexible sigmoidoscopy or colonoscopy. You may have a sigmoidoscopy every 5 years or a colonoscopy every 10 years starting at age 71.  Hepatitis C blood test.  Hepatitis B blood test.  Sexually transmitted disease (STD) testing.  Diabetes screening. This is done by checking your blood sugar (glucose) after you have not eaten for a while (fasting). You may have this done every 1-3 years.  Bone density scan. This is done to screen for osteoporosis. You may have this done starting at age 71.  Mammogram. This may be done every 1-2 years. Talk to your health care provider about how often you should have regular mammograms. Talk with your health care provider about your test  results, treatment options, and if necessary, the need for more tests. Vaccines  Your health care provider may recommend certain vaccines, such as:  Influenza vaccine. This is recommended every year.  Tetanus, diphtheria, and acellular pertussis (Tdap, Td) vaccine. You may need a Td booster every 10  years.  Zoster vaccine. You may need this after age 53.  Pneumococcal 13-valent conjugate (PCV13) vaccine. One dose is recommended after age 15.  Pneumococcal polysaccharide (PPSV23) vaccine. One dose is recommended after age 71. Talk to your health care provider about which screenings and vaccines you need and how often you need them. This information is not intended to replace advice given to you by your health care provider. Make sure you discuss any questions you have with your health care provider. Document Released: 09/27/2015 Document Revised: 05/20/2016 Document Reviewed: 07/02/2015 Elsevier Interactive Patient Education  2017 [place] Prevention in the Home Falls can cause injuries. They can happen to people of all ages. There are many things you can do to make your home safe and to help prevent falls. What can I do on the outside of my home?  Regularly fix the edges of walkways and driveways and fix any cracks.  Remove anything that might make you trip as you walk through a door, such as a raised step or threshold.  Trim any bushes or trees on the path to your home.  Use bright outdoor lighting.  Clear any walking paths of anything that might make someone trip, such as rocks or tools.  Regularly check to see if handrails are loose or broken. Make sure that both sides of any steps have handrails.  Any raised decks and porches should have guardrails on the edges.  Have any leaves, snow, or ice cleared regularly.  Use sand or salt on walking paths during winter.  Clean up any spills in your garage right away. This includes oil or grease spills. What can I do in the bathroom?  Use night lights.  Install grab bars by the toilet and in the tub and shower. Do not use towel bars as grab bars.  Use non-skid mats or decals in the tub or shower.  If you need to sit down in the shower, use a plastic, non-slip stool.  Keep the floor dry. Clean up any water that  spills on the floor as soon as it happens.  Remove soap buildup in the tub or shower regularly.  Attach bath mats securely with double-sided non-slip rug tape.  Do not have throw rugs and other things on the floor that can make you trip. What can I do in the bedroom?  Use night lights.  Make sure that you have a light by your bed that is easy to reach.  Do not use any sheets or blankets that are too big for your bed. They should not hang down onto the floor.  Have a firm chair that has side arms. You can use this for support while you get dressed.  Do not have throw rugs and other things on the floor that can make you trip. What can I do in the kitchen?  Clean up any spills right away.  Avoid walking on wet floors.  Keep items that you use a lot in easy-to-reach places.  If you need to reach something above you, use a strong step stool that has a grab bar.  Keep electrical cords out of the way.  Do not use floor polish or wax that makes  floors slippery. If you must use wax, use non-skid floor wax.  Do not have throw rugs and other things on the floor that can make you trip. What can I do with my stairs?  Do not leave any items on the stairs.  Make sure that there are handrails on both sides of the stairs and use them. Fix handrails that are broken or loose. Make sure that handrails are as long as the stairways.  Check any carpeting to make sure that it is firmly attached to the stairs. Fix any carpet that is loose or worn.  Avoid having throw rugs at the top or bottom of the stairs. If you do have throw rugs, attach them to the floor with carpet tape.  Make sure that you have a light switch at the top of the stairs and the bottom of the stairs. If you do not have them, ask someone to add them for you. What else can I do to help prevent falls?  Wear shoes that:  Do not have high heels.  Have rubber bottoms.  Are comfortable and fit you well.  Are closed at the  toe. Do not wear sandals.  If you use a stepladder:  Make sure that it is fully opened. Do not climb a closed stepladder.  Make sure that both sides of the stepladder are locked into place.  Ask someone to hold it for you, if possible.  Clearly mark and make sure that you can see:  Any grab bars or handrails.  First and last steps.  Where the edge of each step is.  Use tools that help you move around (mobility aids) if they are needed. These include:  Canes.  Walkers.  Scooters.  Crutches.  Turn on the lights when you go into a dark area. Replace any light bulbs as soon as they burn out.  Set up your furniture so you have a clear path. Avoid moving your furniture around.  If any of your floors are uneven, fix them.  If there are any pets around you, be aware of where they are.  Review your medicines with your doctor. Some medicines can make you feel dizzy. This can increase your chance of falling. Ask your doctor what other things that you can do to help prevent falls. This information is not intended to replace advice given to you by your health care provider. Make sure you discuss any questions you have with your health care provider. Document Released: 06/27/2009 Document Revised: 02/06/2016 Document Reviewed: 10/05/2014 Elsevier Interactive Patient Education  2017 [employer].

## 2017-12-20 NOTE — Progress Notes (Signed)
Patient: Molly Leonard, Female    DOB: 12-05-1945, 72 y.o.   MRN: [id number] Visit Date: 12/20/2017  Today's Provider: Margaretann Loveless, PA-C   No chief complaint on file.  Subjective:     Complete Physical Molly Leonard is a 72 y.o. female. She feels well. She reports exercising none. She reports she is sleeping well. She was seen by [facility] for Medicare wellness visit today. These notes were reviewed. Patient declines 6CIT exam.   -----------------------------------------------------------   Review of Systems  Constitutional: Negative.   HENT: Negative.   Eyes: Negative.   Respiratory: Negative.   Cardiovascular: Negative.   Gastrointestinal: Negative.   Endocrine: Negative.   Genitourinary: Negative.   Musculoskeletal: Negative.   Skin: Negative.   Allergic/Immunologic: Negative.   Neurological: Negative.   Hematological: Negative.   Psychiatric/Behavioral: Negative.     Social History   Socioeconomic History  . Marital status: Married    Spouse name: Not on file  . Number of children: 3  . Years of education: Not on file  . Highest education level: 12th grade  Occupational History  . Occupation: Network engineer    Comment: full time  Social Needs  . Financial resource strain: Not hard at all  . Food insecurity:    Worry: Never true    Inability: Never true  . Transportation needs:    Medical: No    Non-medical: No  Tobacco Use  . Smoking status: Current Every Day Smoker    Packs/day: 0.75  . Smokeless tobacco: Never Used  Substance and Sexual Activity  . Alcohol use: No  . Drug use: No  . Sexual activity: Not on file  Lifestyle  . Physical activity:    Days per week: Not on file    Minutes per session: Not on file  . Stress: Very much  Relationships  . Social connections:    Talks on phone: Not on file    Gets together: Not on file    Attends religious service: Not on file    Active member of club or organization: Not on file    Attends meetings of  clubs or organizations: Not on file    Relationship status: Not on file  . Intimate partner violence:    Fear of current or ex partner: Not on file    Emotionally abused: Not on file    Physically abused: Not on file    Forced sexual activity: Not on file  Other Topics Concern  . Not on file  Social History Narrative  . Not on file    Past Medical History:  Diagnosis Date  . Anxiety   . Depression      Patient Active Problem List   Diagnosis Date Noted  . Dyspareunia in female 12/04/2015  . Allergic rhinitis 10/22/2015  . Absolute anemia 10/22/2015  . Airway hyperreactivity 10/22/2015  . 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 Chronic obstructive pulmonary disease (HCC) 10/22/2015  . Esophagitis, reflux 10/22/2015  . Hypercholesterolemia 10/22/2015  . Cannot sleep 10/22/2015  . Arthritis, degenerative 10/22/2015  . Episodic paroxysmal anxiety disorder 10/22/2015  . Current tobacco use 10/22/2015  . Anxiety 05/21/2015  . Depression 03/25/2015    Past Surgical History:  Procedure Laterality Date  . ABDOMINAL HYSTERECTOMY  1973    Her family history includes Bone cancer in her brother; Diabetes in her mother; Heart disease in her mother; Multiple sclerosis in her sister.      Current Outpatient Medications:  .  albuterol (VENTOLIN HFA) 108 (90 Base) MCG/ACT inhaler, INHALE 2 PUFFS EVERY 4 HOURS AS NEEDED FOR SHORTNESS OF BREATH (Patient not taking: Reported on 02/17/2017), Disp: 18 g, Rfl: 5 .  ALPRAZolam (XANAX) 1 MG tablet, TAKE ONE TABLET BY MOUTH 3 TIMES DAILY AS NEEDED FOR ANXIETY, Disp: 90 tablet, Rfl: 5 .  citalopram (CELEXA) 10 MG tablet, Take 0.5 tablets (5 mg total) by mouth daily., Disp: 45 tablet, Rfl: 1 .  SYMBICORT 160-4.5 MCG/ACT inhaler, TAKE 2 PUFFS TWICE A DAY (Patient not taking: Reported on 01/18/2017), Disp: 10.2 g, Rfl: 5  Patient Care Team: Margaretann Loveless, PA-C as PCP - General (Family Medicine)     Objective:   Vitals: BP 128/62 (BP  Location: Left Arm)   Pulse 84   Temp 98.6 F (37 C) (Oral)   Ht 5\' 7"  (1.702 m)   Wt 132 lb 9.6 oz (60.1 kg)   BMI 20.77 kg/m   Body mass index is 20.77 kg/m.   Physical Exam  Constitutional: She is oriented to person, place, and time. She appears well-developed and well-nourished.  HENT:  Head: Normocephalic.  Right Ear: External ear normal.  Left Ear: External ear normal.  Nose: Nose normal.  Mouth/Throat: Oropharynx is clear and moist.  Eyes: Pupils are equal, round, and reactive to light. Conjunctivae and EOM are normal.  Neck: Normal range of motion. Neck supple.  Cardiovascular: Normal rate, regular rhythm, normal heart sounds and intact distal pulses.  Pulmonary/Chest: Effort normal and breath sounds normal.  Musculoskeletal: Normal range of motion.  Neurological: She is alert and oriented to person, place, and time.  Skin: Skin is warm and dry.  Psychiatric: Her speech is normal and behavior is normal. Judgment and thought content normal. Her mood appears anxious.  Vitals reviewed.   Activities of Daily Living In your present state of health, do you have any difficulty performing the following activities: 12/20/2017  Hearing? N  Vision? N  Difficulty concentrating or making decisions? N  Walking or climbing stairs? N  Dressing or bathing? N  Doing errands, shopping? N  Preparing Food and eating ? N  Using the Toilet? N  In the past six months, have you accidently leaked urine? Y  Comment Occasionally with pressure, wears protection.   Do you have problems with loss of bowel control? N  Managing your Medications? N  Managing your Finances? N  Housekeeping or managing your Housekeeping? N  Some recent data might be hidden    Fall Risk Assessment Fall Risk  12/20/2017 11/11/2016 10/23/2015  Falls in the past year? No No No     Depression Screen PHQ 2/9 Scores 12/20/2017 01/18/2017 11/11/2016 08/27/2016  PHQ - 2 Score 0 4 4 6   PHQ- 9 Score - 16 5 21    Cognitive  Function: Pt declined screening today. Per [facility] note.  Cognitive Testing - 6-CIT-Patient declined with provider.     Assessment & Plan:    Annual Physical Reviewed patient's Family Medical History Reviewed and updated list of patient's medical providers Assessment of cognitive impairment was done Assessed patient's functional ability Established a written schedule for health screening services Health Risk Assessent Completed and Reviewed  Exercise Activities and Dietary recommendations Goals    . DIET - INCREASE WATER INTAKE     Recommend to start drinking 3 glasses of water a day.         Immunization History  Administered Date(s) Administered  . Influenza Split 07/22/2016  . Influenza, High Dose Seasonal  PF 07/12/2015, 06/16/2017  . Influenza-Unspecified 06/16/2017  . Pneumococcal Conjugate-13 10/06/2013  . Pneumococcal Polysaccharide-23 10/17/2014  . Tdap 06/17/2011    Health Maintenance  Topic Date Due  . Hepatitis C Screening  [birth year]  . MAMMOGRAM  10/21/2017  . INFLUENZA VACCINE  04/14/2018  . TETANUS/TDAP  06/16/2021  . COLONOSCOPY  10/17/2024  . DEXA SCAN  Completed  . PNA vac Low Risk Adult  Completed     Discussed health benefits of physical activity, and encouraged her to engage in regular exercise appropriate for her age and condition.    1. Encounter for annual physical exam Reviewed [facility] note today. Up to date on immunizations. Patient is to call insurance company to see if Shingrix vaccine covered.   2. Breast cancer screening There is family history of breast cancer in her daughter. She does perform regular self breast exams. Mammogram was ordered as below. [facility] patient so she may schedule her mammogram at her convenience. - MM DIGITAL SCREENING BILATERAL; Future  3. Hypercholesterolemia Diet controlled. Will check labs as below and f/u pending results. - Comprehensive metabolic panel - CBC with  Differential/Platelet - Lipid panel  4. Mixed simple and mucopurulent chronic bronchitis (HCC) Stable. Not currently using inhalers. Message sent to Glenna FellowsShawn Perkins, RN by Serita KyleMakenzie, [facility], to see if patient qualifies and to schedule low dose CT for lung cancer screening. Discussed smoking cessation as well. Patient intolerant to Chantix. Will be starting wellbutrin 100mg  once daily back as noted below that may help some with smoking cessation. Patient was advised we normally use wellbutrin 150mg  BID for smoking cessation, but she is intolerant to anything higher as it makes her more nervous feeling. Information for smoking cessation printed for patient. She reports she wants to quit because she doesn't like smelling like smoke. Discussed different ways to try to cut back and hopefully quit smoking. She agrees to try them.  - Comprehensive metabolic panel - CBC with Differential/Platelet  5. Current tobacco use See above medical treatment plan. - Comprehensive metabolic panel - CBC with Differential/Platelet  6. Atypical depressive disorder Stable. Taking Celexa 5mg  daily. Wants to add wellbutrin 100mg  daily. She has this stock at home. She will call if intolerant to wellbutrin and/or if she wants to change Celexa. Will check labs as below and f/u pending results. - Comprehensive metabolic panel - CBC with Differential/Platelet - TSH  7. Anxiety See above medical treatment plan. - Comprehensive metabolic panel - CBC with Differential/Platelet - TSH  8. Need for hepatitis C screening test  - Hepatitis C antibody  ------------------------------------------------------------------------------------------------------------    Margaretann LovelessJennifer M Jaymason Ledesma, PA-C  [facility]

## 2017-12-21 ENCOUNTER — Telehealth: Payer: Self-pay | Admitting: *Deleted

## 2017-12-21 LAB — CBC WITH DIFFERENTIAL/PLATELET
BASOS: 0 %
Basophils Absolute: 0 10*3/uL (ref 0.0–0.2)
EOS (ABSOLUTE): 0 10*3/uL (ref 0.0–0.4)
EOS: 0 %
HEMATOCRIT: 32.3 % — AB (ref 34.0–46.6)
HEMOGLOBIN: 10.4 g/dL — AB (ref 11.1–15.9)
IMMATURE GRANS (ABS): 0 10*3/uL (ref 0.0–0.1)
Immature Granulocytes: 0 %
LYMPHS ABS: 1.7 10*3/uL (ref 0.7–3.1)
LYMPHS: 29 %
MCH: 33.2 pg — AB (ref 26.6–33.0)
MCHC: 32.2 g/dL (ref 31.5–35.7)
MCV: 103 fL — AB (ref 79–97)
MONOCYTES: 8 %
Monocytes Absolute: 0.5 10*3/uL (ref 0.1–0.9)
NEUTROS ABS: 3.7 10*3/uL (ref 1.4–7.0)
Neutrophils: 63 %
Platelets: 346 10*3/uL (ref 150–379)
RBC: 3.13 x10E6/uL — ABNORMAL LOW (ref 3.77–5.28)
RDW: 13.2 % (ref 12.3–15.4)
WBC: 5.9 10*3/uL (ref 3.4–10.8)

## 2017-12-21 LAB — COMPREHENSIVE METABOLIC PANEL
ALBUMIN: 4.6 g/dL (ref 3.5–4.8)
ALT: 15 IU/L (ref 0–32)
AST: 21 IU/L (ref 0–40)
Albumin/Globulin Ratio: 2 (ref 1.2–2.2)
Alkaline Phosphatase: 88 IU/L (ref 39–117)
BILIRUBIN TOTAL: 0.2 mg/dL (ref 0.0–1.2)
BUN / CREAT RATIO: 23 (ref 12–28)
BUN: 23 mg/dL (ref 8–27)
CALCIUM: 9.6 mg/dL (ref 8.7–10.3)
CHLORIDE: 100 mmol/L (ref 96–106)
CO2: 21 mmol/L (ref 20–29)
Creatinine, Ser: 1 mg/dL (ref 0.57–1.00)
GFR, EST AFRICAN AMERICAN: 66 mL/min/{1.73_m2} (ref >59)
GFR, EST NON AFRICAN AMERICAN: 57 mL/min/{1.73_m2} — AB (ref >59)
GLUCOSE: 101 mg/dL — AB (ref 65–99)
Globulin, Total: 2.3 g/dL (ref 1.5–4.5)
Potassium: 4.2 mmol/L (ref 3.5–5.2)
Sodium: 139 mmol/L (ref 134–144)
TOTAL PROTEIN: 6.9 g/dL (ref 6.0–8.5)

## 2017-12-21 LAB — LIPID PANEL
CHOL/HDL RATIO: 4.2 ratio (ref 0.0–4.4)
CHOLESTEROL TOTAL: 169 mg/dL (ref 100–199)
HDL: 40 mg/dL (ref >39)
LDL CALC: 93 mg/dL (ref 0–99)
Triglycerides: 182 mg/dL — ABNORMAL HIGH (ref 0–149)
VLDL CHOLESTEROL CAL: 36 mg/dL (ref 5–40)

## 2017-12-21 LAB — TSH: TSH: 1.89 u[IU]/mL (ref 0.450–4.500)

## 2017-12-21 LAB — HEPATITIS C ANTIBODY

## 2017-12-21 NOTE — Telephone Encounter (Signed)
Received referral for initial lung cancer screening scan. Contacted patient who reports that she is too busy until the Fall to have the lung screening scan. Patient knows that she can contact me to schedule if she is able to prior to the Fall.

## 2018-01-24 ENCOUNTER — Other Ambulatory Visit: Payer: Self-pay | Admitting: Physician Assistant

## 2018-01-24 DIAGNOSIS — F324 Major depressive disorder, single episode, in partial remission: Secondary | ICD-10-CM

## 2018-02-11 ENCOUNTER — Telehealth: Payer: Self-pay | Admitting: Physician Assistant

## 2018-02-11 ENCOUNTER — Other Ambulatory Visit: Payer: Self-pay | Admitting: Family Medicine

## 2018-02-11 ENCOUNTER — Telehealth: Payer: Self-pay | Admitting: Family Medicine

## 2018-02-11 DIAGNOSIS — B349 Viral infection, unspecified: Secondary | ICD-10-CM

## 2018-02-11 MED ORDER — ALBUTEROL SULFATE HFA 108 (90 BASE) MCG/ACT IN AERS: INHALATION_SPRAY | RESPIRATORY_TRACT | 5 refills | Status: DC

## 2018-02-11 NOTE — Telephone Encounter (Signed)
OPEN IN ERROR 

## 2018-02-11 NOTE — Telephone Encounter (Signed)
Last filled 10/30/16. KW 

## 2018-02-11 NOTE — Telephone Encounter (Signed)
[facility] faxed a refill request for the following medication. Thanks CC  albuterol (VENTOLIN HFA) 108 (90 Base) MCG/ACT inhaler

## 2018-03-07 ENCOUNTER — Other Ambulatory Visit: Payer: Self-pay | Admitting: Physician Assistant

## 2018-03-07 DIAGNOSIS — F419 Anxiety disorder, unspecified: Secondary | ICD-10-CM

## 2018-04-19 ENCOUNTER — Other Ambulatory Visit: Payer: Self-pay | Admitting: Physician Assistant

## 2018-04-19 DIAGNOSIS — F324 Major depressive disorder, single episode, in partial remission: Secondary | ICD-10-CM

## 2018-05-21 ENCOUNTER — Telehealth: Payer: Self-pay

## 2018-05-21 NOTE — Telephone Encounter (Signed)
Call pt regarding lung screening scan  Left message on 05-21-18 at 2:03.

## 2018-05-24 ENCOUNTER — Telehealth: Payer: Self-pay | Admitting: Nurse Practitioner

## 2018-06-04 ENCOUNTER — Telehealth: Payer: Self-pay

## 2018-06-04 NOTE — Telephone Encounter (Signed)
Call pt regarding lung screening . Per pt family member will call next week to schedule.

## 2018-06-08 ENCOUNTER — Encounter: Payer: Self-pay | Admitting: *Deleted

## 2018-08-26 ENCOUNTER — Encounter: Payer: Self-pay | Admitting: Physician Assistant

## 2018-08-26 ENCOUNTER — Ambulatory Visit (INDEPENDENT_AMBULATORY_CARE_PROVIDER_SITE_OTHER): Payer: Medicare Other | Admitting: Physician Assistant

## 2018-08-26 VITALS — BP 132/75 | HR 82 | Temp 97.8°F | Resp 16 | Wt 131.0 lb

## 2018-08-26 DIAGNOSIS — J418 Mixed simple and mucopurulent chronic bronchitis: Secondary | ICD-10-CM

## 2018-08-26 DIAGNOSIS — J301 Allergic rhinitis due to pollen: Secondary | ICD-10-CM | POA: Diagnosis not present

## 2018-08-26 DIAGNOSIS — B349 Viral infection, unspecified: Secondary | ICD-10-CM | POA: Insufficient documentation

## 2018-08-26 DIAGNOSIS — J0140 Acute pansinusitis, unspecified: Secondary | ICD-10-CM

## 2018-08-26 MED ORDER — AMOXICILLIN-POT CLAVULANATE 875-125 MG PO TABS: 1.0000 | ORAL_TABLET | Freq: Two times a day (BID) | ORAL | 0 refills | Status: DC

## 2018-08-26 MED ORDER — FLUTICASONE PROPIONATE 50 MCG/ACT NA SUSP: 2.0000 | Freq: Every day | NASAL | 6 refills | Status: DC

## 2018-08-26 MED ORDER — BUDESONIDE-FORMOTEROL FUMARATE 160-4.5 MCG/ACT IN AERO: INHALATION_SPRAY | RESPIRATORY_TRACT | 5 refills | Status: DC

## 2018-08-26 NOTE — Progress Notes (Signed)
Patient: Molly Leonard Female    DOB: 10/08/1945   72 y.o.   MRN: [id number] Visit Date: 08/26/2018  Today's Provider: Margaretann LovelessJennifer M Burnette, PA-C   No chief complaint on file.  Subjective:     HPI  Patient here today c/o sore throat, jaw pain and left ear pain x's 3 days. Patient reports cough, but reports smoking. Patient requesting refill on symbicort for daily use. Patient reports she has been using albuterol almost daily due to shortness of breath.  No Known Allergies   Current Outpatient Medications:  .  albuterol (VENTOLIN HFA) 108 (90 Base) MCG/ACT inhaler, INHALE 2 PUFFS EVERY 4 HOURS AS NEEDED FOR SHORTNESS OF BREATH, Disp: 18 g, Rfl: 5 .  ALPRAZolam (XANAX) 1 MG tablet, TAKE ONE TABLET BY MOUTH 3 TIMES DAILY AS NEEDED FOR ANXIETY, Disp: 90 tablet, Rfl: 5 .  citalopram (CELEXA) 10 MG tablet, ONE-HALF TABLET BY MOUTH EVERY DAY, Disp: 45 tablet, Rfl: 1 .  SYMBICORT 160-4.5 MCG/ACT inhaler, TAKE 2 PUFFS TWICE A DAY (Patient not taking: Reported on 01/18/2017), Disp: 10.2 g, Rfl: 5  Review of Systems  Constitutional: Negative.   HENT: Positive for congestion, ear pain, postnasal drip, rhinorrhea, sinus pain and sore throat. Negative for sinus pressure.   Respiratory: Positive for cough, chest tightness and shortness of breath.   Neurological: Negative for dizziness and headaches.    Social History   Tobacco Use  . Smoking status: Current Every Day Smoker    Packs/day: 0.75  . Smokeless tobacco: Never Used  Substance Use Topics  . Alcohol use: No      Objective:   BP 132/75 (BP Location: Left Arm, Patient Position: Sitting, Cuff Size: Normal)   Pulse 82   Temp 97.8 F (36.6 C) (Oral)   Resp 16   Wt 131 lb (59.4 kg)   SpO2 98%   BMI 20.52 kg/m  Vitals:   08/26/18 1205  BP: 132/75  Pulse: 82  Resp: 16  Temp: 97.8 F (36.6 C)  TempSrc: Oral  SpO2: 98%  Weight: 131 lb (59.4 kg)     Physical Exam Vitals signs reviewed.  Constitutional:    General: She is not in acute distress.    Appearance: She is well-developed and normal weight. She is not diaphoretic.  HENT:     Head: Normocephalic and atraumatic.     Right Ear: Hearing, tympanic membrane, ear canal and external ear normal.     Left Ear: Hearing, tympanic membrane, ear canal and external ear normal.     Nose:     Right Sinus: Maxillary sinus tenderness and frontal sinus tenderness present.     Left Sinus: Maxillary sinus tenderness and frontal sinus tenderness present.     Mouth/Throat:     Pharynx: Uvula midline. No oropharyngeal exudate.  Neck:     Musculoskeletal: Normal range of motion and neck supple.     Thyroid: No thyromegaly.     Trachea: No tracheal deviation.  Cardiovascular:     Rate and Rhythm: Normal rate and regular rhythm.     Heart sounds: Normal heart sounds. No murmur. No friction rub. No gallop.   Pulmonary:     Effort: Pulmonary effort is normal. No respiratory distress.     Breath sounds: Normal breath sounds. No stridor. No wheezing or rales.  Lymphadenopathy:     Cervical: No cervical adenopathy.       Assessment & Plan    1. Acute pansinusitis, recurrence  not specified Worsening symptoms that have not responded to OTC medications. Will give augmentin as below. Continue allergy medications. Stay well hydrated and get plenty of rest. Call if no symptom improvement or if symptoms worsen. - amoxicillin-clavulanate (AUGMENTIN) 875-125 MG tablet; Take 1 tablet by mouth 2 (two) times daily.  Dispense: 20 tablet; Refill: 0  2. Mixed simple and mucopurulent chronic bronchitis (HCC) For COPD. Needs daily inhaler. Use albuterol for prn use.  - budesonide-formoterol (SYMBICORT) 160-4.5 MCG/ACT inhaler; TAKE 2 PUFFS TWICE A DAY  Dispense: 10.2 g; Refill: 5  3. Non-seasonal allergic rhinitis due to pollen For nasal congestion and ear pain.  - fluticasone (FLONASE) 50 MCG/ACT nasal spray; Place 2 sprays into both nostrils daily.  Dispense: 16 g;  Refill: 6     Margaretann Loveless, PA-C  [facility]

## 2018-09-28 ENCOUNTER — Other Ambulatory Visit: Payer: Self-pay | Admitting: Physician Assistant

## 2018-09-28 DIAGNOSIS — F419 Anxiety disorder, unspecified: Secondary | ICD-10-CM

## 2018-11-16 ENCOUNTER — Other Ambulatory Visit: Payer: Self-pay | Admitting: Physician Assistant

## 2018-11-16 DIAGNOSIS — F324 Major depressive disorder, single episode, in partial remission: Secondary | ICD-10-CM

## 2018-12-22 ENCOUNTER — Ambulatory Visit: Payer: Self-pay

## 2019-02-17 ENCOUNTER — Other Ambulatory Visit: Payer: Self-pay | Admitting: Physician Assistant

## 2019-02-17 DIAGNOSIS — B349 Viral infection, unspecified: Secondary | ICD-10-CM

## 2019-02-17 MED ORDER — ALBUTEROL SULFATE HFA 108 (90 BASE) MCG/ACT IN AERS: INHALATION_SPRAY | RESPIRATORY_TRACT | 1 refills | Status: DC

## 2019-02-17 NOTE — Telephone Encounter (Signed)
[facility] faxed refill request for the following medications:  albuterol (PROAIR HFA) 108 (90 Base) MCG/ACT inhaler  Please advise. Thanks TNP

## 2019-03-01 ENCOUNTER — Other Ambulatory Visit: Payer: Self-pay | Admitting: Physician Assistant

## 2019-03-01 DIAGNOSIS — F419 Anxiety disorder, unspecified: Secondary | ICD-10-CM

## 2019-05-18 ENCOUNTER — Other Ambulatory Visit: Payer: Self-pay | Admitting: Physician Assistant

## 2019-05-18 DIAGNOSIS — F324 Major depressive disorder, single episode, in partial remission: Secondary | ICD-10-CM

## 2019-05-25 ENCOUNTER — Telehealth: Payer: Self-pay

## 2019-05-25 NOTE — Telephone Encounter (Signed)
Called pt to schedule a telephonic AWV (previous was cancelled due to Covid) and she declined scheduling right now. PT would like a CB in November to set up apt. Note made.

## 2019-06-20 ENCOUNTER — Other Ambulatory Visit: Payer: Self-pay

## 2019-06-20 ENCOUNTER — Ambulatory Visit (INDEPENDENT_AMBULATORY_CARE_PROVIDER_SITE_OTHER): Payer: Medicare Other

## 2019-06-20 DIAGNOSIS — Z23 Encounter for immunization: Secondary | ICD-10-CM

## 2019-06-30 ENCOUNTER — Other Ambulatory Visit: Payer: Self-pay | Admitting: Physician Assistant

## 2019-06-30 DIAGNOSIS — B349 Viral infection, unspecified: Secondary | ICD-10-CM

## 2019-07-20 ENCOUNTER — Other Ambulatory Visit: Payer: Self-pay

## 2019-07-20 DIAGNOSIS — Z20822 Contact with and (suspected) exposure to covid-19: Secondary | ICD-10-CM

## 2019-07-21 LAB — NOVEL CORONAVIRUS, NAA: SARS-CoV-2, NAA: NOT DETECTED

## 2019-07-27 NOTE — Telephone Encounter (Signed)
Pt not available. Will try back another day.

## 2019-08-08 ENCOUNTER — Other Ambulatory Visit: Payer: Self-pay

## 2019-08-08 DIAGNOSIS — Z20822 Contact with and (suspected) exposure to covid-19: Secondary | ICD-10-CM

## 2019-08-09 LAB — NOVEL CORONAVIRUS, NAA: SARS-CoV-2, NAA: NOT DETECTED

## 2019-08-11 ENCOUNTER — Telehealth: Payer: Self-pay | Admitting: Physician Assistant

## 2019-08-11 NOTE — Telephone Encounter (Signed)
Patient's spouse is calling to receive negative COVID test results. Patient expressed understanding.

## 2019-08-29 ENCOUNTER — Other Ambulatory Visit: Payer: Self-pay | Admitting: Physician Assistant

## 2019-08-29 DIAGNOSIS — F419 Anxiety disorder, unspecified: Secondary | ICD-10-CM

## 2019-08-29 NOTE — Telephone Encounter (Signed)
Spoke with husband who states pt is not home. Husband said to [facility] mobile # tomorrow after lunch.

## 2019-08-29 NOTE — Telephone Encounter (Signed)
Requested medication (s) are due for refill today: yes  Requested medication (s) are on the active medication list: yes  Last refill: 08/02/2019  Future visit scheduled: no  Notes to clinic:  not delegated; no valid encounter in last 6 months    Requested Prescriptions  Pending Prescriptions Disp Refills   ALPRAZolam (XANAX) 1 MG tablet [Pharmacy Med Name: ALPRAZOLAM 1 MG TAB] 90 tablet     Sig: TAKE ONE TABLET 3 TIMES DAILY AS NEEDED FOR ANXIETY      Not Delegated - Psychiatry:  Anxiolytics/Hypnotics Failed - 08/29/2019  2:08 PM      Failed - This refill cannot be delegated      Failed - Urine Drug Screen completed in last 360 days.      Failed - Valid encounter within last 6 months    Recent Outpatient Visits           1 year ago Acute pansinusitis, recurrence not specified   [facility] [place], [place], [place]   1 year ago Encounter for annual physical exam   [facility] [place], [place]   2 years ago Depression, major, single episode, in partial remission [facility] [place])   [place], [place], [place]   2 years ago Depression, major, single episode, moderate [facility])   [place], [place], [place]   2 years ago Recurrent major depressive disorder, in partial remission [facility])   [facility] [place], [place], [place]

## 2019-08-30 ENCOUNTER — Other Ambulatory Visit: Payer: Self-pay | Admitting: Physician Assistant

## 2019-08-30 DIAGNOSIS — J301 Allergic rhinitis due to pollen: Secondary | ICD-10-CM

## 2019-08-30 NOTE — Telephone Encounter (Signed)
Requested medication (s) are due for refill today:   Requested medication (s) are on the active medication list: yes  Last refill:  01/30/2019  Future visit scheduled: no  Notes to clinic:  LOV-08/26/2018 Review medication for refill Overdue for visit   Requested Prescriptions  Pending Prescriptions Disp Refills   fluticasone (FLONASE) 50 MCG/ACT nasal spray [Pharmacy Med Name: FLUTICASONE PROPIONATE 50 MCG/ACT N]      Sig: PLACE 2 SPRAYS IN EACH NOSTRIL EVERY DAY      Ear, Nose, and Throat: Nasal Preparations - Corticosteroids Failed - 08/30/2019  9:20 AM      Failed - Valid encounter within last 12 months    Recent Outpatient Visits           1 year ago Acute pansinusitis, recurrence not specified   [facility] [place], [place], [place]-C   1 year ago Encounter for annual physical exam   [place], [place], [place]   2 years ago Depression, major, single episode, in partial remission [facility])   [place], [place], [place]   2 years ago Depression, major, single episode, moderate [facility])   [place], [place], [place]   2 years ago Recurrent major depressive disorder, in partial remission [facility])   [facility] [place], [place], [place]

## 2019-09-05 NOTE — Telephone Encounter (Signed)
Spoke with husband- pt is not home. Advised to CB next Monday, 09/11/19.

## 2019-10-03 ENCOUNTER — Other Ambulatory Visit: Payer: Self-pay | Admitting: Physician Assistant

## 2019-10-03 DIAGNOSIS — B349 Viral infection, unspecified: Secondary | ICD-10-CM

## 2019-10-03 NOTE — Telephone Encounter (Signed)
Requested Prescriptions  Pending Prescriptions Disp Refills  . albuterol (VENTOLIN HFA) 108 (90 Base) MCG/ACT inhaler [Pharmacy Med Name: ALBUTEROL SULFATE HFA 108 (90 BASE)] 8.5 g     Sig: 2 INHALATIONS INTO LUNGS EVERY 4 HOURS AS NEEDED FOR SHORTNESS OF BREATH     Pulmonology:  Beta Agonists Failed - 10/03/2019 10:55 AM      Failed - One inhaler should last at least one month. If the patient is requesting refills earlier, contact the patient to check for uncontrolled symptoms.      Failed - Valid encounter within last 12 months    Recent Outpatient Visits          1 year ago Acute pansinusitis, recurrence not specified   [facility] [place], [place], [place]   1 year ago Encounter for annual physical exam   [facility] [place] Man [place], [place]   2 years ago Depression, major, single episode, in partial remission [facility])   [facility] [place], [place], [place]   2 years ago Depression, major, single episode, moderate [facility])   [facility] [place], [place], [place]   2 years ago Recurrent major depressive disorder, in partial remission [facility])   [facility] [place], [place], [place]

## 2019-10-09 NOTE — Telephone Encounter (Signed)
Scheduled telephonic AWV for 10/18/19 @ 2:00 PM.

## 2019-10-17 NOTE — Progress Notes (Signed)
Subjective:   Molly Leonard is a 74 y.o. female who presents for Medicare Annual (Subsequent) preventive examination.    This visit is being conducted through telemedicine due to the COVID-19 pandemic. This patient has given me verbal consent via doximity to conduct this visit, patient states they are participating from their home address. Some vital signs may be absent or patient reported.    Patient identification: identified by name, DOB, and current address  Review of Systems:  N/A  Cardiac Risk Factors include: advanced age (>1men, >40 women);smoking/ tobacco exposure     Objective:     Vitals: There were no vitals taken for this visit.  There is no height or weight on file to calculate BMI. Unable to obtain vitals due to visit being conducted via telephonically.   Advanced Directives 10/18/2019 12/20/2017 12/20/2017 01/18/2017 11/08/2016 12/04/2015 10/23/2015  Does Patient Have a Medical Advance Directive? No No Yes Yes Yes Yes Yes  Type of Advance Directive - - Living will Healthcare Power of [place];Living will - Living will;Healthcare Power of [employer] Power of [place];Living will  Does patient want to make changes to medical advance directive? - No - Patient declined - - - - -  Would patient like information on creating a medical advance directive? No - Patient declined - - - - - -    Tobacco Social History   Tobacco Use  Smoking Status Current Every Day Smoker  . Packs/day: 0.75  . Types: Cigarettes  Smokeless Tobacco Never Used     Ready to quit: Not Answered Counseling given: Not Answered   Clinical Intake:  Pre-visit preparation completed: Yes  Pain : No/denies pain Pain Score: 0-No pain     Nutritional Risks: None Diabetes: No  How often do you need to have someone help you when you read instructions, pamphlets, or other written materials from your doctor or pharmacy?: 1 - Never  Interpreter Needed?: No  Information entered by :: [facility],  LPN  Past Medical History:  Diagnosis Date  . Anxiety   . Depression    Past Surgical History:  Procedure Laterality Date  . ABDOMINAL HYSTERECTOMY  1973   Family History  Problem Relation Age of Onset  . Heart disease Mother   . Diabetes Mother   . Multiple sclerosis Sister   . Bone cancer Brother    Social History   Socioeconomic History  . Marital status: Married    Spouse name: Not on file  . Number of children: 3  . Years of education: Not on file  . Highest education level: 12th grade  Occupational History  . Occupation: Owner    Comment: full time  Tobacco Use  . Smoking status: Current Every Day Smoker    Packs/day: 0.75    Types: Cigarettes  . Smokeless tobacco: Never Used  Substance and Sexual Activity  . Alcohol use: No  . Drug use: No  . Sexual activity: Not on file  Other Topics Concern  . Not on file  Social History Narrative  . Not on file   Social Determinants of Health   Financial Resource Strain: Low Risk   . Difficulty of Paying Living Expenses: Not hard at all  Food Insecurity: No Food Insecurity  . Worried About Programme researcher, broadcasting/film/video in the Last Year: Never true  . Ran Out of Food in the Last Year: Never true  Transportation Needs: No Transportation Needs  . Lack of Transportation (Medical): No  . Lack of Transportation (  Non-Medical): No  Physical Activity: Inactive  . Days of Exercise per Week: 0 days  . Minutes of Exercise per Session: 0 min  Stress: No Stress Concern Present  . Feeling of Stress : Not at all  Social Connections: Somewhat Isolated  . Frequency of Communication with Friends and Family: Three times a week  . Frequency of Social Gatherings with Friends and Family: More than three times a week  . Attends Religious Services: Never  . Active Member of Clubs or Organizations: No  . Attends Archivist Meetings: Never  . Marital Status: Married    Outpatient Encounter Medications as of 10/18/2019  Medication Sig    . albuterol (VENTOLIN HFA) 108 (90 Base) MCG/ACT inhaler 2 INHALATIONS INTO LUNGS EVERY 4 HOURS AS NEEDED FOR SHORTNESS OF BREATH  . ALPRAZolam (XANAX) 1 MG tablet TAKE ONE TABLET 3 TIMES DAILY AS NEEDED FOR ANXIETY  . budesonide-formoterol (SYMBICORT) 160-4.5 MCG/ACT inhaler TAKE 2 PUFFS TWICE A DAY  . citalopram (CELEXA) 10 MG tablet TAKE HALF A TABLET BY MOUTH EVERY DAY  . fluticasone (FLONASE) 50 MCG/ACT nasal spray PLACE 2 SPRAYS IN EACH NOSTRIL EVERY DAY (Patient taking differently: Place 1 spray into both nostrils as needed. )  . amoxicillin-clavulanate (AUGMENTIN) 875-125 MG tablet Take 1 tablet by mouth 2 (two) times daily. (Patient not taking: Reported on 10/18/2019)   No facility-administered encounter medications on file as of 10/18/2019.    Activities of Daily Living In your present state of health, do you have any difficulty performing the following activities: 10/18/2019  Hearing? N  Vision? N  Difficulty concentrating or making decisions? N  Walking or climbing stairs? Y  Comment Due to weak leg muscles.  Dressing or bathing? N  Doing errands, shopping? N  Preparing Food and eating ? N  Using the Toilet? N  In the past six months, have you accidently leaked urine? N  Do you have problems with loss of bowel control? N  Managing your Medications? N  Managing your Finances? N  Housekeeping or managing your Housekeeping? N  Some recent data might be hidden    Patient Care Team: Rubye Beach as PCP - General (Family Medicine)    Assessment:   This is a routine wellness examination for Molly Leonard.  Exercise Activities and Dietary recommendations Current Exercise Habits: The patient does not participate in regular exercise at present, Exercise limited by: None identified  Goals    . DIET - INCREASE WATER INTAKE     Recommend to start drinking 3 glasses of water a day.         Fall Risk: Fall Risk  10/18/2019 12/20/2017 11/11/2016 10/23/2015  Falls in the past year?  0 No No No  Number falls in past yr: 0 - - -  Injury with Fall? 0 - - -    FALL RISK PREVENTION PERTAINING TO THE HOME:  Any stairs in or around the home? Yes  If so, are there any without handrails? No   Home free of loose throw rugs in walkways, pet beds, electrical cords, etc? Yes  Adequate lighting in your home to reduce risk of falls? Yes   ASSISTIVE DEVICES UTILIZED TO PREVENT FALLS:  Life alert? No  Use of a cane, walker or w/c? No  Grab bars in the bathroom? No  Shower chair or bench in shower? No  Elevated toilet seat or a handicapped toilet? Yes    TIMED UP AND GO:  Was the test performed?  No .    Depression Screen PHQ 2/9 Scores 10/18/2019 10/18/2019 12/20/2017 01/18/2017  PHQ - 2 Score 1 1 0 4  PHQ- 9 Score - - - 16     Cognitive Function: Declined today.        Immunization History  Administered Date(s) Administered  . Fluad Quad(high Dose 65+) 06/20/2019  . Influenza Split 07/22/2016  . Influenza, High Dose Seasonal PF 07/12/2015, 06/16/2017  . Influenza-Unspecified 06/16/2017, 06/04/2018  . Pneumococcal Conjugate-13 10/06/2013  . Pneumococcal Polysaccharide-23 10/17/2014  . Tdap 06/17/2011    Qualifies for Shingles Vaccine? Yes . Due for Shingrix. Pt has been advised to call insurance company to determine out of pocket expense. Advised may also receive vaccine at local pharmacy or Health Dept. Verbalized acceptance and understanding.  Tdap: Up to date  Flu Vaccine: Up to date  Pneumococcal Vaccine: Completed series  Screening Tests Health Maintenance  Topic Date Due  . COLONOSCOPY  12/21/1996  . DEXA SCAN  03/04/2011  . MAMMOGRAM  10/21/2017  . TETANUS/TDAP  06/16/2021  . INFLUENZA VACCINE  Completed  . Hepatitis C Screening  Completed  . PNA vac Low Risk Adult  Completed    Cancer Screenings:  Colorectal Screening: Currently due. Pt declined a referral, cologuard or FOBT test today.  Mammogram: Completed 10/22/15. Repeat every 1-2 years  as advised. Currently due  Bone Density: Currently due. Pt declined order today.   Lung Cancer Screening: (Low Dose CT Chest recommended if Age 49-80 years, 30 pack-year currently smoking OR have quit w/in 15years.) does qualify, however declines scan at this time.  Additional Screening:  Hepatitis C Screening: Up to date  Vision Screening: Recommended annual ophthalmology exams for early detection of glaucoma and other disorders of the eye.  Dental Screening: Recommended annual dental exams for proper oral hygiene  Community Resource Referral:  CRR required this visit?  No       Plan:  I have personally reviewed and addressed the Medicare Annual Wellness questionnaire and have noted the following in the patient's chart:  A. Medical and social history B. Use of alcohol, tobacco or illicit drugs  C. Current medications and supplements D. Functional ability and status E.  Nutritional status F.  Physical activity G. Advance directives H. List of other physicians I.  Hospitalizations, surgeries, and ER visits in previous 12 months J.  Vitals K. Screenings such as hearing and vision if needed, cognitive and depression L. Referrals and appointments   In addition, I have reviewed and discussed with patient certain preventive protocols, quality metrics, and best practice recommendations. A written personalized care plan for preventive services as well as general preventive health recommendations were provided to patient. Nurse Health Advisor  Signed,    Merry [place]ond Randall, [place]  11/21/7671 Nurse Health Advisor   Nurse Notes: Pt declined a colonoscopy referral, mammogram order or a DEXA scan order today.

## 2019-10-18 ENCOUNTER — Other Ambulatory Visit: Payer: Self-pay

## 2019-10-18 ENCOUNTER — Ambulatory Visit (INDEPENDENT_AMBULATORY_CARE_PROVIDER_SITE_OTHER): Payer: Medicare Other

## 2019-10-18 DIAGNOSIS — Z0000 Encounter for general adult medical examination without abnormal findings: Secondary | ICD-10-CM

## 2019-10-18 NOTE — Patient Instructions (Signed)
Molly Leonard , Thank you for taking time to come for your Medicare Wellness Visit. I appreciate your ongoing commitment to your health goals. Please review the following plan we discussed and let me know if I can assist you in the future.   Screening recommendations/referrals: Colonoscopy: Currently due. Pt declined scheduling a colonoscopy at this time. Mammogram: Currently due. Pt declined order at this time. Bone Density: Currently due. Pt declined order at this time.  Recommended yearly ophthalmology/optometry visit for glaucoma screening and checkup Recommended yearly dental visit for hygiene and checkup  Vaccinations: Influenza vaccine: Up to date Pneumococcal vaccine: Completed series Tdap vaccine: Up to date, due 06/2021 Shingles vaccine: Pt declines today.     Advanced directives: Advance directive discussed with you today. Even though you declined this today please call our office should you change your mind and we can give you the proper paperwork for you to fill out.  Conditions/risks identified: Continue to increase water intake to 6-8 8 oz glasses a day.   Next appointment: 10/22/20 @ 2:00 PM for an AWV. Declined scheduling a follow up with PCP at this time.   Preventive Care 32 Years and Older, Female Preventive care refers to lifestyle choices and visits with your health care provider that can promote health and wellness. What does preventive care include?  A yearly physical exam. This is also called an annual well check.  Dental exams once or twice a year.  Routine eye exams. Ask your health care provider how often you should have your eyes checked.  Personal lifestyle choices, including:  Daily care of your teeth and gums.  Regular physical activity.  Eating a healthy diet.  Avoiding tobacco and drug use.  Limiting alcohol use.  Practicing safe sex.  Taking low-dose aspirin every day.  Taking vitamin and mineral supplements as recommended by your health  care provider. What happens during an annual well check? The services and screenings done by your health care provider during your annual well check will depend on your age, overall health, lifestyle risk factors, and family history of disease. Counseling  Your health care provider may ask you questions about your:  Alcohol use.  Tobacco use.  Drug use.  Emotional well-being.  Home and relationship well-being.  Sexual activity.  Eating habits.  History of falls.  Memory and ability to understand (cognition).  Work and work Astronomer.  Reproductive health. Screening  You may have the following tests or measurements:  Height, weight, and BMI.  Blood pressure.  Lipid and cholesterol levels. These may be checked every 5 years, or more frequently if you are over 92 years old.  Skin check.  Lung cancer screening. You may have this screening every year starting at age 3 if you have a 30-pack-year history of smoking and currently smoke or have quit within the past 15 years.  Fecal occult blood test (FOBT) of the stool. You may have this test every year starting at age 67.  Flexible sigmoidoscopy or colonoscopy. You may have a sigmoidoscopy every 5 years or a colonoscopy every 10 years starting at age 5.  Hepatitis C blood test.  Hepatitis B blood test.  Sexually transmitted disease (STD) testing.  Diabetes screening. This is done by checking your blood sugar (glucose) after you have not eaten for a while (fasting). You may have this done every 1-3 years.  Bone density scan. This is done to screen for osteoporosis. You may have this done starting at age 22.  Mammogram. This  may be done every 1-2 years. Talk to your health care provider about how often you should have regular mammograms. Talk with your health care provider about your test results, treatment options, and if necessary, the need for more tests. Vaccines  Your health care provider may recommend certain  vaccines, such as:  Influenza vaccine. This is recommended every year.  Tetanus, diphtheria, and acellular pertussis (Tdap, Td) vaccine. You may need a Td booster every 10 years.  Zoster vaccine. You may need this after age 49.  Pneumococcal 13-valent conjugate (PCV13) vaccine. One dose is recommended after age 51.  Pneumococcal polysaccharide (PPSV23) vaccine. One dose is recommended after age 17. Talk to your health care provider about which screenings and vaccines you need and how often you need them. This information is not intended to replace advice given to you by your health care provider. Make sure you discuss any questions you have with your health care provider. Document Released: 09/27/2015 Document Revised: 05/20/2016 Document Reviewed: 07/02/2015 Elsevier Interactive Patient Education  2017 [employer] Prevention in the Home Falls can cause injuries. They can happen to people of all ages. There are many things you can do to make your home safe and to help prevent falls. What can I do on the outside of my home?  Regularly fix the edges of walkways and driveways and fix any cracks.  Remove anything that might make you trip as you walk through a door, such as a raised step or threshold.  Trim any bushes or trees on the path to your home.  Use bright outdoor lighting.  Clear any walking paths of anything that might make someone trip, such as rocks or tools.  Regularly check to see if handrails are loose or broken. Make sure that both sides of any steps have handrails.  Any raised decks and porches should have guardrails on the edges.  Have any leaves, snow, or ice cleared regularly.  Use sand or salt on walking paths during winter.  Clean up any spills in your garage right away. This includes oil or grease spills. What can I do in the bathroom?  Use night lights.  Install grab bars by the toilet and in the tub and shower. Do not use towel bars as grab  bars.  Use non-skid mats or decals in the tub or shower.  If you need to sit down in the shower, use a plastic, non-slip stool.  Keep the floor dry. Clean up any water that spills on the floor as soon as it happens.  Remove soap buildup in the tub or shower regularly.  Attach bath mats securely with double-sided non-slip rug tape.  Do not have throw rugs and other things on the floor that can make you trip. What can I do in the bedroom?  Use night lights.  Make sure that you have a light by your bed that is easy to reach.  Do not use any sheets or blankets that are too big for your bed. They should not hang down onto the floor.  Have a firm chair that has side arms. You can use this for support while you get dressed.  Do not have throw rugs and other things on the floor that can make you trip. What can I do in the kitchen?  Clean up any spills right away.  Avoid walking on wet floors.  Keep items that you use a lot in easy-to-reach places.  If you need to reach something above  you, use a strong step stool that has a grab bar.  Keep electrical cords out of the way.  Do not use floor polish or wax that makes floors slippery. If you must use wax, use non-skid floor wax.  Do not have throw rugs and other things on the floor that can make you trip. What can I do with my stairs?  Do not leave any items on the stairs.  Make sure that there are handrails on both sides of the stairs and use them. Fix handrails that are broken or loose. Make sure that handrails are as long as the stairways.  Check any carpeting to make sure that it is firmly attached to the stairs. Fix any carpet that is loose or worn.  Avoid having throw rugs at the top or bottom of the stairs. If you do have throw rugs, attach them to the floor with carpet tape.  Make sure that you have a light switch at the top of the stairs and the bottom of the stairs. If you do not have them, ask someone to add them for  you. What else can I do to help prevent falls?  Wear shoes that:  Do not have high heels.  Have rubber bottoms.  Are comfortable and fit you well.  Are closed at the toe. Do not wear sandals.  If you use a stepladder:  Make sure that it is fully opened. Do not climb a closed stepladder.  Make sure that both sides of the stepladder are locked into place.  Ask someone to hold it for you, if possible.  Clearly mark and make sure that you can see:  Any grab bars or handrails.  First and last steps.  Where the edge of each step is.  Use tools that help you move around (mobility aids) if they are needed. These include:  Canes.  Walkers.  Scooters.  Crutches.  Turn on the lights when you go into a dark area. Replace any light bulbs as soon as they burn out.  Set up your furniture so you have a clear path. Avoid moving your furniture around.  If any of your floors are uneven, fix them.  If there are any pets around you, be aware of where they are.  Review your medicines with your doctor. Some medicines can make you feel dizzy. This can increase your chance of falling. Ask your doctor what other things that you can do to help prevent falls. This information is not intended to replace advice given to you by your health care provider. Make sure you discuss any questions you have with your health care provider. Document Released: 06/27/2009 Document Revised: 02/06/2016 Document Reviewed: 10/05/2014 Elsevier Interactive Patient Education  2017 [employer].

## 2019-11-14 ENCOUNTER — Other Ambulatory Visit: Payer: Self-pay | Admitting: Physician Assistant

## 2019-11-14 DIAGNOSIS — F324 Major depressive disorder, single episode, in partial remission: Secondary | ICD-10-CM

## 2019-11-15 NOTE — Telephone Encounter (Signed)
Requested medication (s) are due for refill today: yes  Requested medication (s) are on the active medication list: yes  Last refill:  08/18/20  Future visit scheduled: no  Notes to clinic:  no valid encounter within last 6 months    Requested Prescriptions  Pending Prescriptions Disp Refills   citalopram (CELEXA) 10 MG tablet [Pharmacy Med Name: CITALOPRAM HYDROBROMIDE 10 MG TAB] 45 tablet 1    Sig: TAKE 1/2 TABLET EVERYDAY      Psychiatry:  Antidepressants - SSRI Failed - 11/14/2019  8:28 PM      Failed - Valid encounter within last 6 months    Recent Outpatient Visits           1 year ago Acute pansinusitis, recurrence not specified   [facility] [place], Alessandra Bevels, PA-C   1 year ago Encounter for annual physical exam   [facility] [place], [place], [place]   2 years ago Depression, major, single episode, in partial remission [facility])   [facility] [place], [place], [place]   2 years ago Depression, major, single episode, moderate [facility])   [facility] [place], [place], [place]   3 years ago Recurrent major depressive disorder, in partial remission [facility])   [facility] [place], [place], [place]              Passed - Completed PHQ-2 or PHQ-9 in the last 360 days.

## 2019-11-16 NOTE — Telephone Encounter (Signed)
LMTCB to schedule a follow up appointment  

## 2019-11-23 NOTE — Progress Notes (Signed)
Patient: Molly Leonard Female    DOB: 05-Oct-1945   74 y.o.   MRN: [id number] Visit Date: 11/24/2019  Today's Provider: Margaretann Loveless, PA-C   Chief Complaint  Patient presents with  . Medication Refill    Depression   Subjective:     HPI  Patient here today needing refills for her Citalopram. Reports that her Depression symptoms are fairly stable. She would prefer to try to add wellbutrin in the mornings as well to her citalopram.   No Known Allergies   Current Outpatient Medications:  .  albuterol (VENTOLIN HFA) 108 (90 Base) MCG/ACT inhaler, 2 INHALATIONS INTO LUNGS EVERY 4 HOURS AS NEEDED FOR SHORTNESS OF BREATH, Disp: 8.5 g, Rfl: 1 .  ALPRAZolam (XANAX) 1 MG tablet, TAKE ONE TABLET 3 TIMES DAILY AS NEEDED FOR ANXIETY, Disp: 90 tablet, Rfl: 5 .  budesonide-formoterol (SYMBICORT) 160-4.5 MCG/ACT inhaler, TAKE 2 PUFFS TWICE A DAY, Disp: 10.2 g, Rfl: 5 .  citalopram (CELEXA) 10 MG tablet, TAKE 1/2 TABLET EVERYDAY, Disp: 45 tablet, Rfl: 1 .  amoxicillin-clavulanate (AUGMENTIN) 875-125 MG tablet, Take 1 tablet by mouth 2 (two) times daily. (Patient not taking: Reported on 10/18/2019), Disp: 20 tablet, Rfl: 0 .  fluticasone (FLONASE) 50 MCG/ACT nasal spray, PLACE 2 SPRAYS IN EACH NOSTRIL EVERY DAY (Patient not taking: No sig reported), Disp: 48 mL, Rfl: 1  Review of Systems  Constitutional: Negative.   Respiratory: Negative.   Cardiovascular: Negative.   Gastrointestinal: Negative.   Psychiatric/Behavioral: Negative.     Social History   Tobacco Use  . Smoking status: Current Every Day Smoker    Packs/day: 0.75    Types: Cigarettes  . Smokeless tobacco: Never Used  Substance Use Topics  . Alcohol use: No      Objective:   BP 134/74 (BP Location: Left Arm, Patient Position: Sitting, Cuff Size: Large)   Pulse 94   Temp (!) 97.3 F (36.3 C) (Temporal)   Resp 16   Wt 135 lb (61.2 kg)   BMI 21.14 kg/m  Vitals:   11/24/19 0834  BP: 134/74  Pulse: 94  Resp:  16  Temp: (!) 97.3 F (36.3 C)  TempSrc: Temporal  Weight: 135 lb (61.2 kg)  Body mass index is 21.14 kg/m.   Physical Exam Vitals reviewed.  Constitutional:      General: She is not in acute distress.    Appearance: Normal appearance. She is well-developed. She is not ill-appearing or diaphoretic.  Cardiovascular:     Rate and Rhythm: Normal rate and regular rhythm.     Pulses: Normal pulses.     Heart sounds: Normal heart sounds. No murmur. No friction rub. No gallop.   Pulmonary:     Effort: Pulmonary effort is normal. No respiratory distress.     Breath sounds: Normal breath sounds. No wheezing or rales.  Musculoskeletal:     Cervical back: Normal range of motion and neck supple.  Neurological:     Mental Status: She is alert.     No results found for any visits on 11/24/19.     Assessment & Plan    1. Depression, major, single episode, in partial remission (HCC) Stable. Will add wellbutrin at patient request for added benefit and also for smoking cessation. Will f/u in 4 weeks to see how she is tolerating. - citalopram (CELEXA) 10 MG tablet; TAKE 1/2 TABLET EVERYDAY  Dispense: 45 tablet; Refill: 1 - buPROPion (WELLBUTRIN XL) 150 MG 24 hr  tablet; Take 1 tablet (150 mg total) by mouth daily.  Dispense: 90 tablet; Refill: 1  2. Mixed simple and mucopurulent chronic bronchitis (HCC) Stable. Continue Symbicort BID. Trying wellbutrin to help with smoking cessation.   3. Hypercholesterolemia Diet controlled. Will check labs as below and f/u pending results. - CBC w/Diff/Platelet - Comprehensive Metabolic Panel (CMET) - Lipid Panel With LDL/HDL Ratio  4. Current tobacco use Discussed smoking cessation for approx 3-4 minutes. Trying wellbutrin as above.   5. Anxiety Stable with citalopram and alprazolam prn.      Mar Daring, PA-C  [facility]

## 2019-11-24 ENCOUNTER — Other Ambulatory Visit: Payer: Self-pay

## 2019-11-24 ENCOUNTER — Ambulatory Visit (INDEPENDENT_AMBULATORY_CARE_PROVIDER_SITE_OTHER): Payer: Medicare Other | Admitting: Physician Assistant

## 2019-11-24 ENCOUNTER — Encounter: Payer: Self-pay | Admitting: Physician Assistant

## 2019-11-24 VITALS — BP 134/74 | HR 94 | Temp 97.3°F | Resp 16 | Wt 135.0 lb

## 2019-11-24 DIAGNOSIS — J418 Mixed simple and mucopurulent chronic bronchitis: Secondary | ICD-10-CM

## 2019-11-24 DIAGNOSIS — Z720 Tobacco use: Secondary | ICD-10-CM | POA: Diagnosis not present

## 2019-11-24 DIAGNOSIS — E7800 Pure hypercholesterolemia, unspecified: Secondary | ICD-10-CM

## 2019-11-24 DIAGNOSIS — F419 Anxiety disorder, unspecified: Secondary | ICD-10-CM

## 2019-11-24 DIAGNOSIS — F324 Major depressive disorder, single episode, in partial remission: Secondary | ICD-10-CM

## 2019-11-24 MED ORDER — BUPROPION HCL ER (XL) 150 MG PO TB24: 150.0000 mg | ORAL_TABLET | Freq: Every day | ORAL | 1 refills | Status: DC

## 2019-11-24 MED ORDER — CITALOPRAM HYDROBROMIDE 10 MG PO TABS: ORAL_TABLET | ORAL | 1 refills | Status: DC

## 2019-11-24 NOTE — Patient Instructions (Signed)
Health Maintenance After Age 73 After age 73, you are at a higher risk for certain long-term diseases and infections as well as injuries from falls. Falls are a major cause of broken bones and head injuries in people who are older than age 73. Getting regular preventive care can help to keep you healthy and well. Preventive care includes getting regular testing and making lifestyle changes as recommended by your health care provider. Talk with your health care provider about:  Which screenings and tests you should have. A screening is a test that checks for a disease when you have no symptoms.  A diet and exercise plan that is right for you. What should I know about screenings and tests to prevent falls? Screening and testing are the best ways to find a health problem early. Early diagnosis and treatment give you the best chance of managing medical conditions that are common after age 73. Certain conditions and lifestyle choices may make you more likely to have a fall. Your health care provider may recommend:  Regular vision checks. Poor vision and conditions such as cataracts can make you more likely to have a fall. If you wear glasses, make sure to get your prescription updated if your vision changes.  Medicine review. Work with your health care provider to regularly review all of the medicines you are taking, including over-the-counter medicines. Ask your health care provider about any side effects that may make you more likely to have a fall. Tell your health care provider if any medicines that you take make you feel dizzy or sleepy.  Osteoporosis screening. Osteoporosis is a condition that causes the bones to get weaker. This can make the bones weak and cause them to break more easily.  Blood pressure screening. Blood pressure changes and medicines to control blood pressure can make you feel dizzy.  Strength and balance checks. Your health care provider may recommend certain tests to check your  strength and balance while standing, walking, or changing positions.  Foot health exam. Foot pain and numbness, as well as not wearing proper footwear, can make you more likely to have a fall.  Depression screening. You may be more likely to have a fall if you have a fear of falling, feel emotionally low, or feel unable to do activities that you used to do.  Alcohol use screening. Using too much alcohol can affect your balance and may make you more likely to have a fall. What actions can I take to lower my risk of falls? General instructions  Talk with your health care provider about your risks for falling. Tell your health care provider if: ? You fall. Be sure to tell your health care provider about all falls, even ones that seem minor. ? You feel dizzy, sleepy, or off-balance.  Take over-the-counter and prescription medicines only as told by your health care provider. These include any supplements.  Eat a healthy diet and maintain a healthy weight. A healthy diet includes low-fat dairy products, low-fat (lean) meats, and fiber from whole grains, beans, and lots of fruits and vegetables. Home safety  Remove any tripping hazards, such as rugs, cords, and clutter.  Install safety equipment such as grab bars in bathrooms and safety rails on stairs.  Keep rooms and walkways well-lit. Activity   Follow a regular exercise program to stay fit. This will help you maintain your balance. Ask your health care provider what types of exercise are appropriate for you.  If you need a cane or   walker, use it as recommended by your health care provider.  Wear supportive shoes that have nonskid soles. Lifestyle  Do not drink alcohol if your health care provider tells you not to drink.  If you drink alcohol, limit how much you have: ? 0-1 drink a day for women. ? 0-2 drinks a day for men.  Be aware of how much alcohol is in your drink. In the [place] one drink equals one typical bottle of beer (12  oz), one-half glass of wine (5 oz), or one shot of hard liquor (1 oz).  Do not use any products that contain nicotine or tobacco, such as cigarettes and e-cigarettes. If you need help quitting, ask your health care provider. Summary  Having a healthy lifestyle and getting preventive care can help to protect your health and wellness after age 73.  Screening and testing are the best way to find a health problem early and help you avoid having a fall. Early diagnosis and treatment give you the best chance for managing medical conditions that are more common for people who are older than age 73.  Falls are a major cause of broken bones and head injuries in people who are older than age 73. Take precautions to prevent a fall at home.  Work with your health care provider to learn what changes you can make to improve your health and wellness and to prevent falls. This information is not intended to replace advice given to you by your health care provider. Make sure you discuss any questions you have with your health care provider. Document Revised: 12/22/2018 Document Reviewed: 07/14/2017 Elsevier Patient Education  2020 Elsevier Inc.  

## 2019-11-25 LAB — CBC WITH DIFFERENTIAL/PLATELET
Basophils Absolute: 0 10*3/uL (ref 0.0–0.2)
Basos: 0 %
EOS (ABSOLUTE): 0.1 10*3/uL (ref 0.0–0.4)
Eos: 1 %
Hematocrit: 34.1 % (ref 34.0–46.6)
Hemoglobin: 11.5 g/dL (ref 11.1–15.9)
Immature Grans (Abs): 0 10*3/uL (ref 0.0–0.1)
Immature Granulocytes: 0 %
Lymphocytes Absolute: 1.4 10*3/uL (ref 0.7–3.1)
Lymphs: 18 %
MCH: 34.5 pg — ABNORMAL HIGH (ref 26.6–33.0)
MCHC: 33.7 g/dL (ref 31.5–35.7)
MCV: 102 fL — ABNORMAL HIGH (ref 79–97)
Monocytes Absolute: 0.4 10*3/uL (ref 0.1–0.9)
Monocytes: 5 %
Neutrophils Absolute: 5.7 10*3/uL (ref 1.4–7.0)
Neutrophils: 76 %
Platelets: 342 10*3/uL (ref 150–450)
RBC: 3.33 x10E6/uL — ABNORMAL LOW (ref 3.77–5.28)
RDW: 11.8 % (ref 11.7–15.4)
WBC: 7.6 10*3/uL (ref 3.4–10.8)

## 2019-11-25 LAB — COMPREHENSIVE METABOLIC PANEL
ALT: 17 IU/L (ref 0–32)
AST: 21 IU/L (ref 0–40)
Albumin/Globulin Ratio: 1.8 (ref 1.2–2.2)
Albumin: 4.6 g/dL (ref 3.7–4.7)
Alkaline Phosphatase: 105 IU/L (ref 39–117)
BUN/Creatinine Ratio: 20 (ref 12–28)
BUN: 24 mg/dL (ref 8–27)
Bilirubin Total: <0.2 mg/dL (ref 0.0–1.2)
CO2: 20 mmol/L (ref 20–29)
Calcium: 10.2 mg/dL (ref 8.7–10.3)
Chloride: 103 mmol/L (ref 96–106)
Creatinine, Ser: 1.18 mg/dL — ABNORMAL HIGH (ref 0.57–1.00)
GFR calc Af Amer: 53 mL/min/{1.73_m2} — ABNORMAL LOW (ref >59)
GFR calc non Af Amer: 46 mL/min/{1.73_m2} — ABNORMAL LOW (ref >59)
Globulin, Total: 2.5 g/dL (ref 1.5–4.5)
Glucose: 94 mg/dL (ref 65–99)
Potassium: 4.5 mmol/L (ref 3.5–5.2)
Sodium: 140 mmol/L (ref 134–144)
Total Protein: 7.1 g/dL (ref 6.0–8.5)

## 2019-11-25 LAB — LIPID PANEL WITH LDL/HDL RATIO
Cholesterol, Total: 195 mg/dL (ref 100–199)
HDL: 36 mg/dL — ABNORMAL LOW (ref >39)
LDL Chol Calc (NIH): 108 mg/dL — ABNORMAL HIGH (ref 0–99)
LDL/HDL Ratio: 3 ratio (ref 0.0–3.2)
Triglycerides: 296 mg/dL — ABNORMAL HIGH (ref 0–149)
VLDL Cholesterol Cal: 51 mg/dL — ABNORMAL HIGH (ref 5–40)

## 2019-11-27 ENCOUNTER — Telehealth: Payer: Self-pay

## 2019-11-27 NOTE — Telephone Encounter (Signed)
Patient advised as directed below. 

## 2019-11-27 NOTE — Telephone Encounter (Signed)
-----   Message from Margaretann Loveless, PA-C sent at 11/27/2019  9:27 AM EDT ----- Blood count is stable and Hemoglobin has improved to normal range. Kidney function declined just slightly. This is most often associated with dehydration. Make sure to stay well hydrated. Liver enzymes are normal. Sodium, potassium and calcium are normal. Cholesterol is ok for non fasting lab.

## 2019-12-22 ENCOUNTER — Ambulatory Visit: Payer: Self-pay | Admitting: Physician Assistant

## 2020-02-12 ENCOUNTER — Other Ambulatory Visit: Payer: Self-pay | Admitting: Physician Assistant

## 2020-02-12 DIAGNOSIS — F324 Major depressive disorder, single episode, in partial remission: Secondary | ICD-10-CM

## 2020-03-15 ENCOUNTER — Other Ambulatory Visit: Payer: Self-pay | Admitting: Physician Assistant

## 2020-03-15 DIAGNOSIS — F419 Anxiety disorder, unspecified: Secondary | ICD-10-CM

## 2020-03-15 NOTE — Telephone Encounter (Signed)
Requested medication (s) are due for refill today: yes  Requested medication (s) are on the active medication list: yes  Last refill:  02/08/2020  Future visit scheduled: yes  Notes to clinic:  this refill cannot be delegated    Requested Prescriptions  Pending Prescriptions Disp Refills   ALPRAZolam (XANAX) 1 MG tablet [Pharmacy Med Name: ALPRAZOLAM 1 MG TAB] 90 tablet     Sig: TAKE ONE TABLET 3 TIMES DAILY AS NEEDED FOR ANXIETY      Not Delegated - Psychiatry:  Anxiolytics/Hypnotics Failed - 03/15/2020  9:48 AM      Failed - This refill cannot be delegated      Failed - Urine Drug Screen completed in last 360 days.      Passed - Valid encounter within last 6 months    Recent Outpatient Visits           3 months ago Depression, major, single episode, in partial remission [facility])   [facility] [place], [place], [place]   1 year ago Acute pansinusitis, recurrence not specified   [facility] [place], [place], [place]   2 years ago Encounter for annual physical exam   [facility] [place], [place]   3 years ago Depression, major, single episode, in partial remission [facility])   [facility] [place], [place], [place]   3 years ago Depression, major, single episode, moderate [facility])   [facility], [place], [place]

## 2020-08-05 ENCOUNTER — Other Ambulatory Visit: Payer: Self-pay | Admitting: Physician Assistant

## 2020-08-05 DIAGNOSIS — F324 Major depressive disorder, single episode, in partial remission: Secondary | ICD-10-CM

## 2020-09-28 ENCOUNTER — Other Ambulatory Visit: Payer: Medicare Other

## 2020-09-28 ENCOUNTER — Other Ambulatory Visit: Payer: Self-pay

## 2020-09-28 DIAGNOSIS — Z20822 Contact with and (suspected) exposure to covid-19: Secondary | ICD-10-CM

## 2020-10-01 LAB — NOVEL CORONAVIRUS, NAA: SARS-CoV-2, NAA: NOT DETECTED

## 2020-10-02 ENCOUNTER — Telehealth: Payer: Self-pay | Admitting: General Practice

## 2020-10-02 NOTE — Telephone Encounter (Signed)
Patients husband, Fayrene Fearing, called to get wife's COVID results. Made him aware they were negative.

## 2020-10-09 ENCOUNTER — Other Ambulatory Visit: Payer: Self-pay | Admitting: Physician Assistant

## 2020-10-09 DIAGNOSIS — F419 Anxiety disorder, unspecified: Secondary | ICD-10-CM

## 2020-10-09 NOTE — Telephone Encounter (Signed)
Requested medication (s) are due for refill today: yes  Requested medication (s) are on the active medication list: yes  Last refill: 03/15/20 #90 with 5 refills  Future visit scheduled: yes  Notes to clinic:  Please review for refill. Refill not delegated per protocol    Requested Prescriptions  Pending Prescriptions Disp Refills   ALPRAZolam (XANAX) 1 MG tablet [Pharmacy Med Name: ALPRAZOLAM 1 MG TAB] 90 tablet     Sig: TAKE ONE TABLET 3 TIMES DAILY AS NEEDED FOR ANXIETY      Not Delegated - Psychiatry:  Anxiolytics/Hypnotics Failed - 10/09/2020 11:52 AM      Failed - This refill cannot be delegated      Failed - Urine Drug Screen completed in last 360 days      Failed - Valid encounter within last 6 months    Recent Outpatient Visits           10 months ago Depression, major, single episode, in partial remission [facility])   [facility] [place], [place], [place]   2 years ago Acute pansinusitis, recurrence not specified   [facility] [place], [place], [place]   2 years ago Encounter for annual physical exam   [facility] [place], [place], [place]   3 years ago Depression, major, single episode, in partial remission [facility])   [facility] [place], [place], [place]   3 years ago Depression, major, single episode, moderate [facility])   [facility] [place], [place]

## 2020-10-16 ENCOUNTER — Encounter: Payer: Self-pay | Admitting: Physician Assistant

## 2020-10-16 ENCOUNTER — Ambulatory Visit (INDEPENDENT_AMBULATORY_CARE_PROVIDER_SITE_OTHER): Payer: Medicare Other | Admitting: Physician Assistant

## 2020-10-16 ENCOUNTER — Other Ambulatory Visit: Payer: Self-pay

## 2020-10-16 VITALS — BP 148/86 | HR 112 | Temp 98.4°F | Ht 68.0 in | Wt 130.0 lb

## 2020-10-16 DIAGNOSIS — F324 Major depressive disorder, single episode, in partial remission: Secondary | ICD-10-CM | POA: Diagnosis not present

## 2020-10-16 DIAGNOSIS — E7800 Pure hypercholesterolemia, unspecified: Secondary | ICD-10-CM

## 2020-10-16 DIAGNOSIS — J418 Mixed simple and mucopurulent chronic bronchitis: Secondary | ICD-10-CM

## 2020-10-16 MED ORDER — CITALOPRAM HYDROBROMIDE 10 MG PO TABS: ORAL_TABLET | ORAL | 1 refills | Status: DC

## 2020-10-16 NOTE — Progress Notes (Signed)
Established patient visit   Patient: Molly Leonard   DOB: 16-May-1946   75 y.o. Female  MRN: [id number] Visit Date: 10/16/2020  Today's healthcare provider: Margaretann Loveless, PA-C   Chief Complaint  Patient presents with  . Depression  . Anxiety   Subjective    HPI  Depression, Follow-up  She  was last seen for this 11 months ago.   She reports excellent compliance with treatment. She is not having side effects.   She reports excellent tolerance of treatment. Current symptoms include: none She feels she is Improved since last visit.   Depression screen [facility] 2/9 11/24/2019 10/18/2019 10/18/2019  Decreased Interest 3 0 0  Down, Depressed, Hopeless 2 1 1   PHQ - 2 Score 5 1 1   Altered sleeping 0 - -  Tired, decreased energy 3 - -  Change in appetite 0 - -  Feeling bad or failure about yourself  0 - -  Trouble concentrating 0 - -  Moving slowly or fidgety/restless 0 - -  Suicidal thoughts 0 - -  PHQ-9 Score 8 - -  Difficult doing work/chores Not difficult at all - -    -----------------------------------------------------------------------------------------   Patient Active Problem List   Diagnosis Date Noted  . Viral syndrome 08/26/2018  . Dyspareunia in female 12/04/2015  . Allergic rhinitis 10/22/2015  . Absolute anemia 10/22/2015  . Airway hyperreactivity 10/22/2015  . 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 Chronic obstructive pulmonary disease (HCC) 10/22/2015  . Esophagitis, reflux 10/22/2015  . Hypercholesterolemia 10/22/2015  . Cannot sleep 10/22/2015  . Arthritis, degenerative 10/22/2015  . Episodic paroxysmal anxiety disorder 10/22/2015  . Current tobacco use 10/22/2015  . Anxiety 05/21/2015  . Depression 03/25/2015   Past Medical History:  Diagnosis Date  . Anxiety   . Depression    Social History   Tobacco Use  . Smoking status: Current Every Day Smoker    Packs/day: 0.75    Types: Cigarettes  . Smokeless tobacco: Never Used  Vaping  Use  . Vaping Use: Former  Substance Use Topics  . Alcohol use: No  . Drug use: No   No Known Allergies   Medications: Outpatient Medications Prior to Visit  Medication Sig  . albuterol (VENTOLIN HFA) 108 (90 Base) MCG/ACT inhaler 2 INHALATIONS INTO LUNGS EVERY 4 HOURS AS NEEDED FOR SHORTNESS OF BREATH  . ALPRAZolam (XANAX) 1 MG tablet TAKE ONE TABLET 3 TIMES DAILY AS NEEDED FOR ANXIETY  . budesonide-formoterol (SYMBICORT) 160-4.5 MCG/ACT inhaler TAKE 2 PUFFS TWICE A DAY  . buPROPion (WELLBUTRIN XL) 150 MG 24 hr tablet TAKE ONE TABLET BY MOUTH EVERY DAY  . citalopram (CELEXA) 10 MG tablet TAKE 1/2 TABLET EVERYDAY   No facility-administered medications prior to visit.    Review of Systems  Constitutional: Negative.   Respiratory: Negative.   Cardiovascular: Negative.   Gastrointestinal: Negative.   Neurological: Negative for dizziness, light-headedness and headaches.  Psychiatric/Behavioral: Negative for confusion, decreased concentration, dysphoric mood, self-injury, sleep disturbance and suicidal ideas. The patient is not nervous/anxious.       Objective    BP (!) 148/86 (BP Location: Left Arm, Patient Position: Sitting, Cuff Size: Normal)   Pulse (!) 112   Temp 98.4 F (36.9 C) (Oral)   Ht 5\' 8"  (1.727 m)   Wt 130 lb (59 kg)   BMI 19.77 kg/m    Physical Exam Vitals reviewed.  Constitutional:      General: She is not in acute distress.  Appearance: Normal appearance. She is well-developed and well-nourished. She is not ill-appearing or diaphoretic.  Neck:     Thyroid: No thyromegaly.     Vascular: No JVD.     Trachea: No tracheal deviation.  Cardiovascular:     Rate and Rhythm: Normal rate and regular rhythm.     Pulses: Normal pulses.     Heart sounds: Normal heart sounds. No murmur heard. No friction rub. No gallop.   Pulmonary:     Effort: Pulmonary effort is normal. No respiratory distress.     Breath sounds: Normal breath sounds. No wheezing or rales.   Musculoskeletal:     Cervical back: Normal range of motion and neck supple.  Neurological:     Mental Status: She is alert.  Psychiatric:        Mood and Affect: Mood normal.        Thought Content: Thought content normal.      No results found for any visits on 10/16/20.  Assessment & Plan     1. Depression, major, single episode, in partial remission (HCC) Stable. Continue Celexa and Alprazolam. Will check labs as below and f/u pending results. - citalopram (CELEXA) 10 MG tablet; TAKE 1/2 TABLET EVERYDAY  Dispense: 45 tablet; Refill: 1 - CBC w/Diff/Platelet - Comprehensive Metabolic Panel (CMET)  2. Mixed simple and mucopurulent chronic bronchitis (HCC) Stable. Continues to smoke.   3. Hypercholesterolemia Diet controlled. Will check labs as below and f/u pending results. - CBC w/Diff/Platelet - Comprehensive Metabolic Panel (CMET) - Lipid Panel With LDL/HDL Ratio   No follow-ups on file.      Delmer Islam, PA-C, have reviewed all documentation for this visit. The documentation on 10/16/20 for the exam, diagnosis, procedures, and orders are all accurate and complete.   Reine Just  [facility] [phone] (phone) [phone] (fax)  [facility]

## 2020-10-16 NOTE — Patient Instructions (Signed)
Preventive Care 61 Years and Older, Female Preventive care refers to lifestyle choices and visits with your health care provider that can promote health and wellness. This includes:  A yearly physical exam. This is also called an annual wellness visit.  Regular dental and eye exams.  Immunizations.  Screening for certain conditions.  Healthy lifestyle choices, such as: ? Eating a healthy diet. ? Getting regular exercise. ? Not using drugs or products that contain nicotine and tobacco. ? Limiting alcohol use. What can I expect for my preventive care visit? Physical exam Your health care provider will check your:  Height and weight. These may be used to calculate your BMI (body mass index). BMI is a measurement that tells if you are at a healthy weight.  Heart rate and blood pressure.  Body temperature.  Skin for abnormal spots. Counseling Your health care provider may ask you questions about your:  Past medical problems.  Family's medical history.  Alcohol, tobacco, and drug use.  Emotional well-being.  Home life and relationship well-being.  Sexual activity.  Diet, exercise, and sleep habits.  History of falls.  Memory and ability to understand (cognition).  Work and work Statistician.  Pregnancy and menstrual history.  Access to firearms. What immunizations do I need? Vaccines are usually given at various ages, according to a schedule. Your health care provider will recommend vaccines for you based on your age, medical history, and lifestyle or other factors, such as travel or where you work.   What tests do I need? Blood tests  Lipid and cholesterol levels. These may be checked every 5 years, or more often depending on your overall health.  Hepatitis C test.  Hepatitis B test. Screening  Lung cancer screening. You may have this screening every year starting at age 74 if you have a 30-pack-year history of smoking and currently smoke or have quit within  the past 15 years.  Colorectal cancer screening. ? All adults should have this screening starting at age 44 and continuing until age 58. ? Your health care provider may recommend screening at age 2 if you are at increased risk. ? You will have tests every 1-10 years, depending on your results and the type of screening test.  Diabetes screening. ? This is done by checking your blood sugar (glucose) after you have not eaten for a while (fasting). ? You may have this done every 1-3 years.  Mammogram. ? This may be done every 1-2 years. ? Talk with your health care provider about how often you should have regular mammograms.  Abdominal aortic aneurysm (AAA) screening. You may need this if you are a current or former smoker.  BRCA-related cancer screening. This may be done if you have a family history of breast, ovarian, tubal, or peritoneal cancers. Other tests  STD (sexually transmitted disease) testing, if you are at risk.  Bone density scan. This is done to screen for osteoporosis. You may have this done starting at age 104. Talk with your health care provider about your test results, treatment options, and if necessary, the need for more tests. Follow these instructions at home: Eating and drinking  Eat a diet that includes fresh fruits and vegetables, whole grains, lean protein, and low-fat dairy products. Limit your intake of foods with high amounts of sugar, saturated fats, and salt.  Take vitamin and mineral supplements as recommended by your health care provider.  Do not drink alcohol if your health care provider tells you not to drink.  If you drink alcohol: ? Limit how much you have to 0-1 drink a day. ? Be aware of how much alcohol is in your drink. In the [place] one drink equals one 12 oz bottle of beer (355 mL), one 5 oz glass of wine (148 mL), or one 1 oz glass of hard liquor (44 mL).   Lifestyle  Take daily care of your teeth and gums. Brush your teeth every morning  and night with fluoride toothpaste. Floss one time each day.  Stay active. Exercise for at least 30 minutes 5 or more days each week.  Do not use any products that contain nicotine or tobacco, such as cigarettes, e-cigarettes, and chewing tobacco. If you need help quitting, ask your health care provider.  Do not use drugs.  If you are sexually active, practice safe sex. Use a condom or other form of protection in order to prevent STIs (sexually transmitted infections).  Talk with your health care provider about taking a low-dose aspirin or statin.  Find healthy ways to cope with stress, such as: ? Meditation, yoga, or listening to music. ? Journaling. ? Talking to a trusted person. ? Spending time with friends and family. Safety  Always wear your seat belt while driving or riding in a vehicle.  Do not drive: ? If you have been drinking alcohol. Do not ride with someone who has been drinking. ? When you are tired or distracted. ? While texting.  Wear a helmet and other protective equipment during sports activities.  If you have firearms in your house, make sure you follow all gun safety procedures. What's next?  Visit your health care provider once a year for an annual wellness visit.  Ask your health care provider how often you should have your eyes and teeth checked.  Stay up to date on all vaccines. This information is not intended to replace advice given to you by your health care provider. Make sure you discuss any questions you have with your health care provider. Document Revised: 08/21/2020 Document Reviewed: 08/25/2018 Elsevier Patient Education  2021 Elsevier Inc.  

## 2020-10-17 LAB — COMPREHENSIVE METABOLIC PANEL
ALT: 9 IU/L (ref 0–32)
AST: 22 IU/L (ref 0–40)
Albumin/Globulin Ratio: 1.6 (ref 1.2–2.2)
Albumin: 4.6 g/dL (ref 3.7–4.7)
Alkaline Phosphatase: 113 IU/L (ref 44–121)
BUN/Creatinine Ratio: 22 (ref 12–28)
BUN: 21 mg/dL (ref 8–27)
Bilirubin Total: <0.2 mg/dL (ref 0.0–1.2)
CO2: 22 mmol/L (ref 20–29)
Calcium: 10.2 mg/dL (ref 8.7–10.3)
Chloride: 101 mmol/L (ref 96–106)
Creatinine, Ser: 0.97 mg/dL (ref 0.57–1.00)
GFR calc Af Amer: 67 mL/min/{1.73_m2} (ref >59)
GFR calc non Af Amer: 58 mL/min/{1.73_m2} — ABNORMAL LOW (ref >59)
Globulin, Total: 2.8 g/dL (ref 1.5–4.5)
Glucose: 98 mg/dL (ref 65–99)
Potassium: 4.4 mmol/L (ref 3.5–5.2)
Sodium: 140 mmol/L (ref 134–144)
Total Protein: 7.4 g/dL (ref 6.0–8.5)

## 2020-10-17 LAB — CBC WITH DIFFERENTIAL/PLATELET
Basophils Absolute: 0 10*3/uL (ref 0.0–0.2)
Basos: 0 %
EOS (ABSOLUTE): 0 10*3/uL (ref 0.0–0.4)
Eos: 0 %
Hematocrit: 33.7 % — ABNORMAL LOW (ref 34.0–46.6)
Hemoglobin: 11.5 g/dL (ref 11.1–15.9)
Immature Grans (Abs): 0 10*3/uL (ref 0.0–0.1)
Immature Granulocytes: 0 %
Lymphocytes Absolute: 1.9 10*3/uL (ref 0.7–3.1)
Lymphs: 30 %
MCH: 35.6 pg — ABNORMAL HIGH (ref 26.6–33.0)
MCHC: 34.1 g/dL (ref 31.5–35.7)
MCV: 104 fL — ABNORMAL HIGH (ref 79–97)
Monocytes Absolute: 0.5 10*3/uL (ref 0.1–0.9)
Monocytes: 7 %
Neutrophils Absolute: 3.9 10*3/uL (ref 1.4–7.0)
Neutrophils: 63 %
Platelets: 394 10*3/uL (ref 150–450)
RBC: 3.23 x10E6/uL — ABNORMAL LOW (ref 3.77–5.28)
RDW: 11.7 % (ref 11.7–15.4)
WBC: 6.3 10*3/uL (ref 3.4–10.8)

## 2020-10-17 LAB — LIPID PANEL WITH LDL/HDL RATIO
Cholesterol, Total: 191 mg/dL (ref 100–199)
HDL: 39 mg/dL — ABNORMAL LOW (ref >39)
LDL Chol Calc (NIH): 87 mg/dL (ref 0–99)
LDL/HDL Ratio: 2.2 ratio (ref 0.0–3.2)
Triglycerides: 399 mg/dL — ABNORMAL HIGH (ref 0–149)
VLDL Cholesterol Cal: 65 mg/dL — ABNORMAL HIGH (ref 5–40)

## 2020-10-21 NOTE — Progress Notes (Signed)
Subjective:   Molly Leonard is a 75 y.o. female who presents for Medicare Annual (Subsequent) preventive examination.  I connected with [place] today by telephone and verified that I am speaking with the correct person using two identifiers. Location patient: home Location provider: work Persons participating in the virtual visit: patient, provider.   I discussed the limitations, risks, security and privacy concerns of performing an evaluation and management service by telephone and the availability of in person appointments. I also discussed with the patient that there may be a patient responsible charge related to this service. The patient expressed understanding and verbally consented to this telephonic visit.    Interactive audio and video telecommunications were attempted between this provider and patient, however failed, due to patient having technical difficulties OR patient did not have access to video capability.  We continued and completed visit with audio only.   Review of Systems    N/A  Cardiac Risk Factors include: advanced age (>36men, >34 women);smoking/ tobacco exposure     Objective:    There were no vitals filed for this visit. There is no height or weight on file to calculate BMI.  Advanced Directives 10/22/2020 10/18/2019 12/20/2017 12/20/2017 01/18/2017 11/08/2016 12/04/2015  Does Patient Have a Medical Advance Directive? No No No Yes Yes Yes Yes  Type of Advance Directive - - - Living will Healthcare Power of [place];Living will - Living will;Healthcare Power of Attorney  Does patient want to make changes to medical advance directive? - - No - Patient declined - - - -  Would patient like information on creating a medical advance directive? No - Patient declined No - Patient declined - - - - -    Current Medications (verified) Outpatient Encounter Medications as of 10/22/2020  Medication Sig  . albuterol (VENTOLIN HFA) 108 (90 Base) MCG/ACT inhaler 2 INHALATIONS INTO  LUNGS EVERY 4 HOURS AS NEEDED FOR SHORTNESS OF BREATH  . ALPRAZolam (XANAX) 1 MG tablet TAKE ONE TABLET 3 TIMES DAILY AS NEEDED FOR ANXIETY  . citalopram (CELEXA) 10 MG tablet TAKE 1/2 TABLET EVERYDAY   No facility-administered encounter medications on file as of 10/22/2020.    Allergies (verified) Patient has no known allergies.   History: Past Medical History:  Diagnosis Date  . Anxiety   . Depression    Past Surgical History:  Procedure Laterality Date  . ABDOMINAL HYSTERECTOMY  1973   Family History  Problem Relation Age of Onset  . Heart disease Mother   . Diabetes Mother   . Multiple sclerosis Sister   . Bone cancer Brother    Social History   Socioeconomic History  . Marital status: Married    Spouse name: Not on file  . Number of children: 3  . Years of education: Not on file  . Highest education level: 12th grade  Occupational History  . Occupation: Owner    Comment: full time  Tobacco Use  . Smoking status: Current Every Day Smoker    Packs/day: 0.75    Types: Cigarettes  . Smokeless tobacco: Never Used  Vaping Use  . Vaping Use: Former  Substance and Sexual Activity  . Alcohol use: No  . Drug use: No  . Sexual activity: Not on file  Other Topics Concern  . Not on file  Social History Narrative  . Not on file   Social Determinants of Health   Financial Resource Strain: Low Risk   . Difficulty of Paying Living Expenses: Not hard at all  Food Insecurity: No Food Insecurity  . Worried About Programme researcher, broadcasting/film/video in the Last Year: Never true  . Ran Out of Food in the Last Year: Never true  Transportation Needs: No Transportation Needs  . Lack of Transportation (Medical): No  . Lack of Transportation (Non-Medical): No  Physical Activity: Inactive  . Days of Exercise per Week: 0 days  . Minutes of Exercise per Session: 0 min  Stress: No Stress Concern Present  . Feeling of Stress : Not at all  Social Connections: Moderately Isolated  . Frequency  of Communication with Friends and Family: More than three times a week  . Frequency of Social Gatherings with Friends and Family: More than three times a week  . Attends Religious Services: Never  . Active Member of Clubs or Organizations: No  . Attends Banker Meetings: Never  . Marital Status: Married    Tobacco Counseling Ready to quit: No Counseling given: No   Clinical Intake:  Pre-visit preparation completed: Yes  Pain : No/denies pain     Nutritional Risks: None Diabetes: No  How often do you need to have someone help you when you read instructions, pamphlets, or other written materials from your doctor or pharmacy?: 1 - Never  Diabetic? No  Interpreter Needed?: No  Information entered by :: [facility], LPN   Activities of Daily Living In your present state of health, do you have any difficulty performing the following activities: 10/22/2020 10/16/2020  Hearing? N N  Vision? N N  Difficulty concentrating or making decisions? N N  Walking or climbing stairs? N N  Dressing or bathing? N N  Doing errands, shopping? N N  Preparing Food and eating ? N -  Using the Toilet? N -  In the past six months, have you accidently leaked urine? N -  Do you have problems with loss of bowel control? N -  Managing your Medications? N -  Managing your Finances? N -  Housekeeping or managing your Housekeeping? N -  Some recent data might be hidden    Patient Care Team: Reine Just as PCP - General (Family Medicine) Domingo Madeira, OD (Optometry)  Indicate any recent Medical Services you may have received from other than [facility] providers in the past year (date may be approximate).     Assessment:   This is a routine wellness examination for Molly Leonard.  Hearing/Vision screen No exam data present  Dietary issues and exercise activities discussed: Current Exercise Habits: The patient does not participate in regular exercise at present, Exercise limited by:  None identified  Goals    . DIET - INCREASE WATER INTAKE     Recommend to start drinking 3 glasses of water a day.      . Quit Smoking     Recommend to continue efforts to reduce smoking habits until no longer smoking.       Depression Screen PHQ 2/9 Scores 10/16/2020 11/24/2019 10/18/2019 10/18/2019 12/20/2017 01/18/2017 11/11/2016  PHQ - 2 Score 2 5 1 1  0 4 4  PHQ- 9 Score 8 8 - - - 16 5    Fall Risk Fall Risk  10/22/2020 10/16/2020 10/18/2019 12/20/2017 11/11/2016  Falls in the past year? 0 0 0 No No  Number falls in past yr: 0 0 0 - -  Injury with Fall? 0 0 0 - -  Risk for fall due to : - No Fall Risks - - -  Follow up - Falls evaluation  completed - - -    FALL RISK PREVENTION PERTAINING TO THE HOME:  Any stairs in or around the home? Yes  If so, are there any without handrails? No  Home free of loose throw rugs in walkways, pet beds, electrical cords, etc? Yes  Adequate lighting in your home to reduce risk of falls? Yes   ASSISTIVE DEVICES UTILIZED TO PREVENT FALLS:  Life alert? No  Use of a cane, walker or w/c? No  Grab bars in the bathroom? No  Shower chair or bench in shower? No  Elevated toilet seat or a handicapped toilet? Yes    Cognitive Function: Normal cognitive status assessed by observation by this Nurse Health Advisor. No abnormalities found.          Immunizations Immunization History  Administered Date(s) Administered  . Fluad Quad(high Dose 65+) 06/20/2019  . Influenza Split 07/22/2016  . Influenza, High Dose Seasonal PF 07/12/2015, 06/16/2017  . Influenza-Unspecified 06/16/2017, 06/04/2018  . PFIZER(Purple Top)SARS-COV-2 Vaccination 09/29/2019, 10/20/2019, 07/09/2020  . Pneumococcal Conjugate-13 10/06/2013  . Pneumococcal Polysaccharide-23 10/17/2014  . Tdap 06/17/2011    TDAP status: Up to date  Flu Vaccine status: Declined, Education has been provided regarding the importance of this vaccine but patient still declined. Advised may receive this vaccine  at local pharmacy or Health Dept. Aware to provide a copy of the vaccination record if obtained from local pharmacy or Health Dept. Verbalized acceptance and understanding.  Pneumococcal vaccine status: Up to date  Covid-19 vaccine status: Completed vaccines  Qualifies for Shingles Vaccine? Yes   Zostavax completed No   Shingrix Completed?: No.    Education has been provided regarding the importance of this vaccine. Patient has been advised to call insurance company to determine out of pocket expense if they have not yet received this vaccine. Advised may also receive vaccine at local pharmacy or Health Dept. Verbalized acceptance and understanding.  Screening Tests Health Maintenance  Topic Date Due  . COLONOSCOPY (Pts 45-32yrs Insurance coverage will need to be confirmed)  Never done  . DEXA SCAN  03/04/2011  . MAMMOGRAM  10/21/2017  . INFLUENZA VACCINE  12/12/2020 (Originally 04/14/2020)  . TETANUS/TDAP  06/16/2021  . COVID-19 Vaccine  Completed  . Hepatitis C Screening  Completed  . PNA vac Low Risk Adult  Completed    Health Maintenance  Health Maintenance Due  Topic Date Due  . COLONOSCOPY (Pts 45-4yrs Insurance coverage will need to be confirmed)  Never done  . DEXA SCAN  03/04/2011  . MAMMOGRAM  10/21/2017    Colorectal cancer screening: Cologuard ordered today. Pt declined colonoscopy referral at this time.   Mammogram status: Currently due, declined order at this time.  Bone Density status: Currently due, declined order at this time.   Lung Cancer Screening: (Low Dose CT Chest recommended if Age 80-80 years, 30 pack-year currently smoking OR have quit w/in 15years.) does qualify however declined.  Additional Screening:  Hepatitis C Screening: Up to date  Vision Screening: Recommended annual ophthalmology exams for early detection of glaucoma and other disorders of the eye. Is the patient up to date with their annual eye exam?  Yes  Who is the provider or what is  the name of the office in which the patient attends annual eye exams? Dr Larence Penning If pt is not established with a provider, would they like to be referred to a provider to establish care? No .   Dental Screening: Recommended annual dental exams for proper oral hygiene  Community Resource Referral / Chronic Care Management: CRR required this visit?  No   CCM required this visit?  No      Plan:     I have personally reviewed and noted the following in the patient's chart:   . Medical and social history . Use of alcohol, tobacco or illicit drugs  . Current medications and supplements . Functional ability and status . Nutritional status . Physical activity . Advanced directives . List of other physicians . Hospitalizations, surgeries, and ER visits in previous 12 months . Vitals . Screenings to include cognitive, depression, and falls . Referrals and appointments  In addition, I have reviewed and discussed with patient certain preventive protocols, quality metrics, and best practice recommendations. A written personalized care plan for preventive services as well as general preventive health recommendations were provided to patient.     Molly Leonard, [place]   [id number]   Nurse Notes: Pt declined colonoscopy referral, DEXA scan and mammogram order at this time.

## 2020-10-22 ENCOUNTER — Telehealth: Payer: Self-pay

## 2020-10-22 ENCOUNTER — Ambulatory Visit (INDEPENDENT_AMBULATORY_CARE_PROVIDER_SITE_OTHER): Payer: Medicare Other

## 2020-10-22 ENCOUNTER — Other Ambulatory Visit: Payer: Self-pay

## 2020-10-22 DIAGNOSIS — Z0000 Encounter for general adult medical examination without abnormal findings: Secondary | ICD-10-CM

## 2020-10-22 DIAGNOSIS — Z1211 Encounter for screening for malignant neoplasm of colon: Secondary | ICD-10-CM

## 2020-10-22 NOTE — Patient Instructions (Signed)
Molly Leonard , Thank you for taking time to come for your Medicare Wellness Visit. I appreciate your ongoing commitment to your health goals. Please review the following plan we discussed and let me know if I can assist you in the future.   Screening recommendations/referrals: Colonoscopy: Cologuard ordered today.  Mammogram: Currently due, declined order at this time.  Bone Density: Currently due, declined order at this time.  Recommended yearly ophthalmology/optometry visit for glaucoma screening and checkup Recommended yearly dental visit for hygiene and checkup  Vaccinations: Influenza vaccine: Currently due Pneumococcal vaccine: Completed series Tdap vaccine: Up to date, due 06/2021 Shingles vaccine: Shingrix discussed. Please contact your pharmacy for coverage information.     Advanced directives: Please bring a copy of your POA (Power of Attorney) and/or Living Will to your next appointment.   Conditions/risks identified: Smoking cessation discussed today. Recommend to increase water intake to 6-8 8 oz glasses a day.   Next appointment: 10/28/21 @ 2:00 PM for an AWV call. Declined scheduling a follow up with PCP at this time.    Preventive Care 58 Years and Older, Female Preventive care refers to lifestyle choices and visits with your health care provider that can promote health and wellness. What does preventive care include?  A yearly physical exam. This is also called an annual well check.  Dental exams once or twice a year.  Routine eye exams. Ask your health care provider how often you should have your eyes checked.  Personal lifestyle choices, including:  Daily care of your teeth and gums.  Regular physical activity.  Eating a healthy diet.  Avoiding tobacco and drug use.  Limiting alcohol use.  Practicing safe sex.  Taking low-dose aspirin every day.  Taking vitamin and mineral supplements as recommended by your health care provider. What happens during an  annual well check? The services and screenings done by your health care provider during your annual well check will depend on your age, overall health, lifestyle risk factors, and family history of disease. Counseling  Your health care provider may ask you questions about your:  Alcohol use.  Tobacco use.  Drug use.  Emotional well-being.  Home and relationship well-being.  Sexual activity.  Eating habits.  History of falls.  Memory and ability to understand (cognition).  Work and work Astronomer.  Reproductive health. Screening  You may have the following tests or measurements:  Height, weight, and BMI.  Blood pressure.  Lipid and cholesterol levels. These may be checked every 5 years, or more frequently if you are over 53 years old.  Skin check.  Lung cancer screening. You may have this screening every year starting at age 59 if you have a 30-pack-year history of smoking and currently smoke or have quit within the past 15 years.  Fecal occult blood test (FOBT) of the stool. You may have this test every year starting at age 75.  Flexible sigmoidoscopy or colonoscopy. You may have a sigmoidoscopy every 5 years or a colonoscopy every 10 years starting at age 49.  Hepatitis C blood test.  Hepatitis B blood test.  Sexually transmitted disease (STD) testing.  Diabetes screening. This is done by checking your blood sugar (glucose) after you have not eaten for a while (fasting). You may have this done every 1-3 years.  Bone density scan. This is done to screen for osteoporosis. You may have this done starting at age 49.  Mammogram. This may be done every 1-2 years. Talk to your health care provider  about how often you should have regular mammograms. Talk with your health care provider about your test results, treatment options, and if necessary, the need for more tests. Vaccines  Your health care provider may recommend certain vaccines, such as:  Influenza  vaccine. This is recommended every year.  Tetanus, diphtheria, and acellular pertussis (Tdap, Td) vaccine. You may need a Td booster every 10 years.  Zoster vaccine. You may need this after age 78.  Pneumococcal 13-valent conjugate (PCV13) vaccine. One dose is recommended after age 78.  Pneumococcal polysaccharide (PPSV23) vaccine. One dose is recommended after age 102. Talk to your health care provider about which screenings and vaccines you need and how often you need them. This information is not intended to replace advice given to you by your health care provider. Make sure you discuss any questions you have with your health care provider. Document Released: 09/27/2015 Document Revised: 05/20/2016 Document Reviewed: 07/02/2015 Elsevier Interactive Patient Education  2017 [place] Prevention in the Home Falls can cause injuries. They can happen to people of all ages. There are many things you can do to make your home safe and to help prevent falls. What can I do on the outside of my home?  Regularly fix the edges of walkways and driveways and fix any cracks.  Remove anything that might make you trip as you walk through a door, such as a raised step or threshold.  Trim any bushes or trees on the path to your home.  Use bright outdoor lighting.  Clear any walking paths of anything that might make someone trip, such as rocks or tools.  Regularly check to see if handrails are loose or broken. Make sure that both sides of any steps have handrails.  Any raised decks and porches should have guardrails on the edges.  Have any leaves, snow, or ice cleared regularly.  Use sand or salt on walking paths during winter.  Clean up any spills in your garage right away. This includes oil or grease spills. What can I do in the bathroom?  Use night lights.  Install grab bars by the toilet and in the tub and shower. Do not use towel bars as grab bars.  Use non-skid mats or decals  in the tub or shower.  If you need to sit down in the shower, use a plastic, non-slip stool.  Keep the floor dry. Clean up any water that spills on the floor as soon as it happens.  Remove soap buildup in the tub or shower regularly.  Attach bath mats securely with double-sided non-slip rug tape.  Do not have throw rugs and other things on the floor that can make you trip. What can I do in the bedroom?  Use night lights.  Make sure that you have a light by your bed that is easy to reach.  Do not use any sheets or blankets that are too big for your bed. They should not hang down onto the floor.  Have a firm chair that has side arms. You can use this for support while you get dressed.  Do not have throw rugs and other things on the floor that can make you trip. What can I do in the kitchen?  Clean up any spills right away.  Avoid walking on wet floors.  Keep items that you use a lot in easy-to-reach places.  If you need to reach something above you, use a strong step stool that has a grab bar.  Keep electrical cords out of the way.  Do not use floor polish or wax that makes floors slippery. If you must use wax, use non-skid floor wax.  Do not have throw rugs and other things on the floor that can make you trip. What can I do with my stairs?  Do not leave any items on the stairs.  Make sure that there are handrails on both sides of the stairs and use them. Fix handrails that are broken or loose. Make sure that handrails are as long as the stairways.  Check any carpeting to make sure that it is firmly attached to the stairs. Fix any carpet that is loose or worn.  Avoid having throw rugs at the top or bottom of the stairs. If you do have throw rugs, attach them to the floor with carpet tape.  Make sure that you have a light switch at the top of the stairs and the bottom of the stairs. If you do not have them, ask someone to add them for you. What else can I do to help  prevent falls?  Wear shoes that:  Do not have high heels.  Have rubber bottoms.  Are comfortable and fit you well.  Are closed at the toe. Do not wear sandals.  If you use a stepladder:  Make sure that it is fully opened. Do not climb a closed stepladder.  Make sure that both sides of the stepladder are locked into place.  Ask someone to hold it for you, if possible.  Clearly mark and make sure that you can see:  Any grab bars or handrails.  First and last steps.  Where the edge of each step is.  Use tools that help you move around (mobility aids) if they are needed. These include:  Canes.  Walkers.  Scooters.  Crutches.  Turn on the lights when you go into a dark area. Replace any light bulbs as soon as they burn out.  Set up your furniture so you have a clear path. Avoid moving your furniture around.  If any of your floors are uneven, fix them.  If there are any pets around you, be aware of where they are.  Review your medicines with your doctor. Some medicines can make you feel dizzy. This can increase your chance of falling. Ask your doctor what other things that you can do to help prevent falls. This information is not intended to replace advice given to you by your health care provider. Make sure you discuss any questions you have with your health care provider. Document Released: 06/27/2009 Document Revised: 02/06/2016 Document Reviewed: 10/05/2014 Elsevier Interactive Patient Education  2017 [employer].

## 2020-10-22 NOTE — Telephone Encounter (Signed)
-----   Message from Margaretann Loveless, [place] sent at 10/22/2020 10:16 AM EST ----- Dewayne Hatch,  All labs are stable. No concerns.  Daiva Nakayama, [facility]

## 2020-10-22 NOTE — Telephone Encounter (Signed)
LMTCB 10/22/2020.  [facility] please advise pt of lab results.    Thanks,   -Vernona Rieger

## 2020-10-23 NOTE — Telephone Encounter (Signed)
Patient advised by [facility] agent. 

## 2020-11-04 ENCOUNTER — Other Ambulatory Visit: Payer: Self-pay | Admitting: Physician Assistant

## 2021-03-06 ENCOUNTER — Other Ambulatory Visit: Payer: Self-pay | Admitting: Physician Assistant

## 2021-03-08 ENCOUNTER — Other Ambulatory Visit: Payer: Self-pay | Admitting: Physician Assistant

## 2021-03-08 DIAGNOSIS — F419 Anxiety disorder, unspecified: Secondary | ICD-10-CM

## 2021-04-24 ENCOUNTER — Ambulatory Visit: Payer: Self-pay | Admitting: *Deleted

## 2021-04-24 NOTE — Telephone Encounter (Signed)
Pt has appt at [facility] on 04/25/2021 at 10am.

## 2021-04-24 NOTE — Telephone Encounter (Signed)
Patient is calling with new onset left foot/ankle swelling. Patient states it has been present for 2-3 weeks- but it is not getting better- and getting worse. Patient is not sure what is causing the swelling- she does report a bite on leg that was above the ankle/foot. No appointment open at office- patient is agreeable to go to outside office over [facility] if possible. Call sent for review

## 2021-04-24 NOTE — Telephone Encounter (Signed)
Reason for Disposition  [1] Very swollen ankle AND [2] no fever  Answer Assessment - Initial Assessment Questions 1. LOCATION: "Which ankle is swollen?" "Where is the swelling?"     Left foot and ankle 2. ONSET: "When did the swelling start?"     2-3 weeks 3. SIZE: "How large is the swelling?"     large 4. PAIN: "Is there any pain?" If Yes, ask: "How bad is it?" (Scale 1-10; or mild, moderate, severe)   - NONE (0): no pain.   - MILD (1-3): doesn't interfere with normal activities.    - MODERATE (4-7): interferes with normal activities (e.g., work or school) or awakens from sleep, limping.    - SEVERE (8-10): excruciating pain, unable to do any normal activities, unable to walk.      Yes- redness, mild 5. CAUSE: "What do you think caused the ankle swelling?"     Possible bite- on leg 6. OTHER SYMPTOMS: "Do you have any other symptoms?" (e.g., fever, chest pain, difficulty breathing, calf pain)     no 7. PREGNANCY: "Is there any chance you are pregnant?" "When was your last menstrual period?"     N/a  Protocols used: Ankle Swelling-A-AH

## 2021-04-25 ENCOUNTER — Encounter: Payer: Self-pay | Admitting: Nurse Practitioner

## 2021-04-25 ENCOUNTER — Other Ambulatory Visit: Payer: Self-pay

## 2021-04-25 ENCOUNTER — Ambulatory Visit (INDEPENDENT_AMBULATORY_CARE_PROVIDER_SITE_OTHER): Payer: Medicare Other | Admitting: Nurse Practitioner

## 2021-04-25 VITALS — BP 144/81 | HR 89 | Temp 98.0°F | Wt 131.0 lb

## 2021-04-25 DIAGNOSIS — M25472 Effusion, left ankle: Secondary | ICD-10-CM

## 2021-04-25 DIAGNOSIS — R030 Elevated blood-pressure reading, without diagnosis of hypertension: Secondary | ICD-10-CM

## 2021-04-25 MED ORDER — CEPHALEXIN 500 MG PO CAPS: 500.0000 mg | ORAL_CAPSULE | Freq: Three times a day (TID) | ORAL | 0 refills | Status: DC

## 2021-04-25 NOTE — Progress Notes (Signed)
Acute Office Visit  Subjective:    Patient ID: Molly Leonard, female    DOB: 09/09/1946, 75 y.o.   MRN: [id number]  Chief Complaint  Patient presents with   Foot Swelling    Pt states she was bit by something around July 4th on the bottom of her L leg. States for the last 3 weeks, she has had some swelling and pain to the touch for the last 3 weeks in that area    HPI Patient is in today for left ankle redness and swelling. She stated that about 3 weeks ago, she was bit by some kind of insect or spider to her left medial ankle. Then she started noticing some swelling and redness along with tenderness to touch to her left foot and ankle. She has been trying to elevated her feet at home, but it only helps the swelling for a short period of time. Swelling is worse at the end of the day. Denies swelling in right foot, shortness of breath, and chest pain.   Past Medical History:  Diagnosis Date   Anxiety    Depression     Past Surgical History:  Procedure Laterality Date   ABDOMINAL HYSTERECTOMY  1973    Family History  Problem Relation Age of Onset   Heart disease Mother    Diabetes Mother    Multiple sclerosis Sister    Bone cancer Brother     Social History   Socioeconomic History   Marital status: Married    Spouse name: Not on file   Number of children: 3   Years of education: Not on file   Highest education level: 12th grade  Occupational History   Occupation: Network engineer    Comment: full time  Tobacco Use   Smoking status: Every Day    Packs/day: 0.75    Types: Cigarettes   Smokeless tobacco: Never  Vaping Use   Vaping Use: Former  Substance and Sexual Activity   Alcohol use: No   Drug use: No   Sexual activity: Not on file  Other Topics Concern   Not on file  Social History Narrative   Not on file   Social Determinants of Health   Financial Resource Strain: Low Risk    Difficulty of Paying Living Expenses: Not hard at all  Food Insecurity: No Food  Insecurity   Worried About Programme researcher, broadcasting/film/video in the Last Year: Never true   Ran Out of Food in the Last Year: Never true  Transportation Needs: No Transportation Needs   Lack of Transportation (Medical): No   Lack of Transportation (Non-Medical): No  Physical Activity: Inactive   Days of Exercise per Week: 0 days   Minutes of Exercise per Session: 0 min  Stress: No Stress Concern Present   Feeling of Stress : Not at all  Social Connections: Moderately Isolated   Frequency of Communication with Friends and Family: More than three times a week   Frequency of Social Gatherings with Friends and Family: More than three times a week   Attends Religious Services: Never   Database administrator or Organizations: No   Attends Engineer, structural: Never   Marital Status: Married  Catering manager Violence: Not At Risk   Fear of Current or Ex-Partner: No   Emotionally Abused: No   Physically Abused: No   Sexually Abused: No    Outpatient Medications Prior to Visit  Medication Sig Dispense Refill   albuterol (VENTOLIN HFA) 108 (  90 Base) MCG/ACT inhaler 2 INHALATIONS INTO LUNGS EVERY 4 HOURS AS NEEDED FOR SHORTNESS OF BREATH 8.5 g 1   ALPRAZolam (XANAX) 1 MG tablet TAKE ONE TABLET THREE TIMES A DAY AS NEEDED FOR ANXIETY 90 tablet 1   citalopram (CELEXA) 10 MG tablet TAKE 1/2 TABLET EVERYDAY 45 tablet 1   buPROPion (WELLBUTRIN XL) 150 MG 24 hr tablet TAKE ONE TABLET BY MOUTH EVERY DAY (Patient not taking: Reported on 04/25/2021) 30 tablet 5   No facility-administered medications prior to visit.    No Known Allergies  Review of Systems  Constitutional: Negative.   Respiratory: Negative.    Cardiovascular: Negative.   Gastrointestinal: Negative.   Musculoskeletal:  Positive for joint swelling (left ankle).  Skin:        Redness to left ankle   Neurological: Negative.       Objective:    Physical Exam Vitals and nursing note reviewed.  Constitutional:      General: She  is not in acute distress.    Appearance: Normal appearance.  HENT:     Head: Normocephalic.  Eyes:     Conjunctiva/sclera: Conjunctivae normal.  Cardiovascular:     Rate and Rhythm: Normal rate and regular rhythm.     Pulses: Normal pulses.     Heart sounds: Normal heart sounds.  Pulmonary:     Effort: Pulmonary effort is normal.     Breath sounds: Normal breath sounds.  Musculoskeletal:     Cervical back: Normal range of motion.     Left lower leg: Edema (1+ pitting edema to left ankle) present.  Skin:    General: Skin is warm.     Findings: Erythema present.     Comments: Erythema, edema, and warmth to left ankle and top of foot  Neurological:     General: No focal deficit present.     Mental Status: She is alert and oriented to person, place, and time.  Psychiatric:        Mood and Affect: Mood normal.        Behavior: Behavior normal.        Thought Content: Thought content normal.        Judgment: Judgment normal.    BP (!) 144/81   Pulse 89   Temp 98 F (36.7 C) (Oral)   Wt 131 lb (59.4 kg)   SpO2 98%   BMI 19.92 kg/m  Wt Readings from Last 3 Encounters:  04/25/21 131 lb (59.4 kg)  10/16/20 130 lb (59 kg)  11/24/19 135 lb (61.2 kg)    Health Maintenance Due  Topic Date Due   COLONOSCOPY (Pts 45-13yrs Insurance coverage will need to be confirmed)  Never done   Zoster Vaccines- Shingrix (1 of 2) Never done   DEXA SCAN  03/04/2011   MAMMOGRAM  10/21/2017   COVID-19 Vaccine (4 - Booster for Pfizer series) 11/09/2020   INFLUENZA VACCINE  04/14/2021    There are no preventive care reminders to display for this patient.   Lab Results  Component Value Date   TSH 1.890 12/20/2017   Lab Results  Component Value Date   WBC 6.3 10/16/2020   HGB 11.5 10/16/2020   HCT 33.7 (L) 10/16/2020   MCV 104 (H) 10/16/2020   PLT 394 10/16/2020   Lab Results  Component Value Date   NA 140 10/16/2020   K 4.4 10/16/2020   CO2 22 10/16/2020   GLUCOSE 98 10/16/2020    BUN 21 10/16/2020   CREATININE  0.97 10/16/2020   BILITOT <0.2 10/16/2020   ALKPHOS 113 10/16/2020   AST 22 10/16/2020   ALT 9 10/16/2020   PROT 7.4 10/16/2020   ALBUMIN 4.6 10/16/2020   CALCIUM 10.2 10/16/2020   ANIONGAP 10 11/08/2016   Lab Results  Component Value Date   CHOL 191 10/16/2020   Lab Results  Component Value Date   HDL 39 (L) 10/16/2020   Lab Results  Component Value Date   LDLCALC 87 10/16/2020   Lab Results  Component Value Date   TRIG 399 (H) 10/16/2020   Lab Results  Component Value Date   CHOLHDL 4.2 12/20/2017   Lab Results  Component Value Date   HGBA1C 5.8 06/25/2011       Assessment & Plan:   Problem List Items Addressed This Visit       Other   Elevated blood pressure reading    BP still elevated on recheck at 144/81. She endorses that she started eating a sausage biscuit every morning from [employer]. Discussed limiting salt/sodium intake to help with blood pressure.       Other Visit Diagnoses     Left ankle swelling    -  Primary   Swelling and redness with recent possible insect bite. Will treat for cellulitis with keflex. She can use ice/compression sock to help with swelling.         Meds ordered this encounter  Medications   cephALEXin (KEFLEX) 500 MG capsule    Sig: Take 1 capsule (500 mg total) by mouth 3 (three) times daily.    Dispense:  21 capsule    Refill:  0      Gerre Scull, NP

## 2021-04-25 NOTE — Assessment & Plan Note (Signed)
BP still elevated on recheck at 144/81. She endorses that she started eating a sausage biscuit every morning from [employer]. Discussed limiting salt/sodium intake to help with blood pressure.

## 2021-04-25 NOTE — Patient Instructions (Addendum)
It was great to see you!  I am sending an antibiotic to your pharmacy for you to take 3 times a day for a week. You can use ice to your ankle along with wearing a compression sock on your left leg. Your blood pressure today was 144/81. Try to limit the amount of salt you are eating.   Let's follow-up in as needed, sooner if you have concerns.  Take care,  Rodman Pickle, NP

## 2021-06-03 ENCOUNTER — Other Ambulatory Visit: Payer: Self-pay | Admitting: Physician Assistant

## 2021-06-03 DIAGNOSIS — F324 Major depressive disorder, single episode, in partial remission: Secondary | ICD-10-CM

## 2021-06-30 ENCOUNTER — Telehealth: Payer: Self-pay

## 2021-06-30 ENCOUNTER — Other Ambulatory Visit: Payer: Self-pay | Admitting: Family Medicine

## 2021-06-30 DIAGNOSIS — F419 Anxiety disorder, unspecified: Secondary | ICD-10-CM

## 2021-06-30 NOTE — Telephone Encounter (Signed)
Copied from CRM [id number]. Topic: Appointment Scheduling - Scheduling Inquiry for Clinic >> Jun 30, 2021 11:20 AM Daphine Deutscher D wrote: Reason for CRM: pt needs an for a CPe and to get refills.  She said she was told Dr. Leonard Schwartz would be her Provider although I do not see that in her records.  CB#  [phone]

## 2021-07-22 ENCOUNTER — Ambulatory Visit (INDEPENDENT_AMBULATORY_CARE_PROVIDER_SITE_OTHER): Payer: Medicare Other | Admitting: Family Medicine

## 2021-07-22 ENCOUNTER — Encounter: Payer: Self-pay | Admitting: Family Medicine

## 2021-07-22 ENCOUNTER — Other Ambulatory Visit: Payer: Self-pay

## 2021-07-22 VITALS — BP 160/70 | HR 86 | Temp 97.4°F | Resp 16 | Ht 65.5 in | Wt 132.9 lb

## 2021-07-22 DIAGNOSIS — Z862 Personal history of diseases of the blood and blood-forming organs and certain disorders involving the immune mechanism: Secondary | ICD-10-CM

## 2021-07-22 DIAGNOSIS — Z0000 Encounter for general adult medical examination without abnormal findings: Secondary | ICD-10-CM

## 2021-07-22 DIAGNOSIS — F324 Major depressive disorder, single episode, in partial remission: Secondary | ICD-10-CM | POA: Diagnosis not present

## 2021-07-22 DIAGNOSIS — F339 Major depressive disorder, recurrent, unspecified: Secondary | ICD-10-CM | POA: Insufficient documentation

## 2021-07-22 DIAGNOSIS — I10 Essential (primary) hypertension: Secondary | ICD-10-CM | POA: Insufficient documentation

## 2021-07-22 DIAGNOSIS — B349 Viral infection, unspecified: Secondary | ICD-10-CM

## 2021-07-22 DIAGNOSIS — E7800 Pure hypercholesterolemia, unspecified: Secondary | ICD-10-CM | POA: Diagnosis not present

## 2021-07-22 DIAGNOSIS — Z1211 Encounter for screening for malignant neoplasm of colon: Secondary | ICD-10-CM | POA: Diagnosis not present

## 2021-07-22 DIAGNOSIS — F419 Anxiety disorder, unspecified: Secondary | ICD-10-CM | POA: Diagnosis not present

## 2021-07-22 DIAGNOSIS — Z5320 Procedure and treatment not carried out because of patient's decision for unspecified reasons: Secondary | ICD-10-CM

## 2021-07-22 DIAGNOSIS — J418 Mixed simple and mucopurulent chronic bronchitis: Secondary | ICD-10-CM | POA: Diagnosis not present

## 2021-07-22 MED ORDER — FLUOXETINE HCL 20 MG PO TABS: 20.0000 mg | ORAL_TABLET | Freq: Every day | ORAL | 1 refills | Status: DC

## 2021-07-22 MED ORDER — ALPRAZOLAM 1 MG PO TABS: 1.0000 mg | ORAL_TABLET | Freq: Three times a day (TID) | ORAL | 1 refills | Status: DC | PRN

## 2021-07-22 MED ORDER — HYDROCHLOROTHIAZIDE 12.5 MG PO TABS: 12.5000 mg | ORAL_TABLET | Freq: Every day | ORAL | 3 refills | Status: DC

## 2021-07-22 NOTE — Assessment & Plan Note (Addendum)
Chronic, stable Counsel on tobacco cessation

## 2021-07-22 NOTE — Assessment & Plan Note (Addendum)
Chronic Discontinue celexa Start prozac 20mg  Discussed risks of long-term benzodiazepine use

## 2021-07-22 NOTE — Assessment & Plan Note (Addendum)
Chronic, not adequately treated Patient concerned about withdrawal symptoms Discontinue celexa Start prozac 20mg 

## 2021-07-22 NOTE — Progress Notes (Signed)
Established patient visit   Patient: Molly Leonard   DOB: [birth year]   75 y.o. Female  MRN: [id number] Visit Date: 07/22/2021  Today's healthcare provider: Shirlee Latch, MD   Chief Complaint  Patient presents with   Annual Exam   Subjective     Increased stress at work over the last year Taking xanax daily after work Not taking full dose of celexa, worried about "getting sick" if she stops taking it She reports that celexa is not helping her She is concerned about her blood pressure  Medications: Outpatient Medications Prior to Visit  Medication Sig   albuterol (VENTOLIN HFA) 108 (90 Base) MCG/ACT inhaler 2 INHALATIONS INTO LUNGS EVERY 4 HOURS AS NEEDED FOR SHORTNESS OF BREATH   [DISCONTINUED] ALPRAZolam (XANAX) 1 MG tablet TAKE ONE TABLET THREE TIMES A DAY AS NEEDED FOR ANXIETY   [DISCONTINUED] citalopram (CELEXA) 10 MG tablet TAKE 1/2 TABLET BY MOUTH ONCE DAILY. Please schedule office visit before any future refills.   [DISCONTINUED] buPROPion (WELLBUTRIN XL) 150 MG 24 hr tablet TAKE ONE TABLET BY MOUTH EVERY DAY (Patient not taking: No sig reported)   [DISCONTINUED] cephALEXin (KEFLEX) 500 MG capsule Take 1 capsule (500 mg total) by mouth 3 (three) times daily.   No facility-administered medications prior to visit.    Review of Systems  Constitutional: Negative.   HENT: Negative.    Eyes: Negative.   Respiratory: Negative.    Cardiovascular: Negative.   Gastrointestinal: Negative.   Endocrine: Negative.   Genitourinary: Negative.   Musculoskeletal: Negative.   Skin: Negative.   Neurological:  Positive for headaches.  Hematological: Negative.   Psychiatric/Behavioral:  The patient is nervous/anxious.       Objective    BP (!) 160/70 (BP Location: Right Arm, Patient Position: Sitting, Cuff Size: Large)   Pulse 86   Temp (!) 97.4 F (36.3 C) (Temporal)   Resp 16   Ht 5' 5.5" (1.664 m)   Wt 132 lb 14.4 oz (60.3 kg)   SpO2 99%   BMI 21.78 kg/m   {Show previous vital signs (optional):23777}  Physical Exam Vitals reviewed.  Constitutional:      General: She is not in acute distress.    Appearance: Normal appearance. She is not ill-appearing or toxic-appearing.  HENT:     Head: Normocephalic and atraumatic.     Right Ear: External ear normal.     Left Ear: External ear normal.     Nose: Nose normal.     Mouth/Throat:     Mouth: Mucous membranes are moist.     Pharynx: Oropharynx is clear. No oropharyngeal exudate or posterior oropharyngeal erythema.  Eyes:     General: No scleral icterus.    Extraocular Movements: Extraocular movements intact.     Conjunctiva/sclera: Conjunctivae normal.     Pupils: Pupils are equal, round, and reactive to light.  Cardiovascular:     Rate and Rhythm: Normal rate and regular rhythm.     Pulses: Normal pulses.     Heart sounds: Normal heart sounds. No murmur heard.   No friction rub. No gallop.  Pulmonary:     Effort: Pulmonary effort is normal. No respiratory distress.     Breath sounds: Normal breath sounds. No wheezing or rhonchi.  Chest:     Chest wall: No tenderness.  Abdominal:     General: Abdomen is flat. There is no distension.     Palpations: Abdomen is soft.     Tenderness: There is  no abdominal tenderness.  Musculoskeletal:        General: Normal range of motion.     Cervical back: Normal range of motion and neck supple.     Right lower leg: No edema.     Left lower leg: No edema.  Skin:    General: Skin is warm and dry.     Capillary Refill: Capillary refill takes less than 2 seconds.     Findings: No lesion or rash.  Neurological:     General: No focal deficit present.     Mental Status: She is alert and oriented to person, place, and time. Mental status is at baseline.  Psychiatric:        Mood and Affect: Mood normal.      No results found for any visits on 07/22/21.  Assessment & Plan     Problem List Items Addressed This Visit       Cardiovascular and  Mediastinum   Primary hypertension    New onset, uncontrolled Check home BPs Start HCTZ 12.5mg  Check CMP F/u in 2 months       Relevant Medications   hydrochlorothiazide (HYDRODIURIL) 12.5 MG tablet   Other Relevant Orders   Comprehensive metabolic panel     Respiratory   Chronic obstructive pulmonary disease (HCC)    Chronic, stable Counsel on tobacco cessation        Other   Anxiety    Chronic Discontinue celexa Start prozac 20mg  Discussed risks of long-term benzodiazepine use      Relevant Medications   ALPRAZolam (XANAX) 1 MG tablet   FLUoxetine (PROZAC) 20 MG tablet   History of anemia    Hgb 11.5 on most recent CBC No symptoms Recheck CBC      Relevant Orders   CBC   Hypercholesterolemia   Relevant Medications   hydrochlorothiazide (HYDRODIURIL) 12.5 MG tablet   Other Relevant Orders   Comprehensive metabolic panel   Lipid panel   Depression, major, single episode, in partial remission (HCC)    Chronic, not adequately treated Patient concerned about withdrawal symptoms Discontinue celexa Start prozac 20mg         Relevant Medications   ALPRAZolam (XANAX) 1 MG tablet   FLUoxetine (PROZAC) 20 MG tablet   Other Visit Diagnoses     Encounter for annual physical exam    -  Primary   Colon cancer screening       Relevant Orders   Cologuard   Mammogram declined            Return in about 2 months (around 09/21/2021) for chronic disease f/u, BP f/u, With new PCP.      , Medical Student 07/22/2021, 1:03 PM   Total time spent on today's visit was greater than 40 minutes, including both face-to-face time and nonface-to-face time personally spent on review of chart (labs and imaging), discussing labs and goals, discussing further work-up, treatment options, referrals to specialist if needed, reviewing outside records of pertinent, answering patient's questions, and coordinating care.   Patient seen along with MS3 student Gwyndolyn Kaufman. I  personally evaluated this patient along with the student, and verified all aspects of the history, physical exam, and medical decision making as documented by the student. I agree with the student's documentation and have made all necessary edits.  Taye Cato, 13/04/2021, MD, MPH [facility]

## 2021-07-22 NOTE — Assessment & Plan Note (Signed)
New onset, uncontrolled Check home BPs Start HCTZ 12.5mg  Check CMP F/u in 2 months

## 2021-07-22 NOTE — Patient Instructions (Signed)
Ask at the pharmacy about the tetanus shot and the COVID booster

## 2021-07-22 NOTE — Assessment & Plan Note (Signed)
Hgb 11.5 on most recent CBC No symptoms Recheck CBC

## 2021-08-15 ENCOUNTER — Other Ambulatory Visit: Payer: Self-pay | Admitting: Family Medicine

## 2021-09-22 NOTE — Progress Notes (Signed)
Established patient visit   Patient: Molly Leonard   DOB: 1946-07-24   76 y.o. Female  MRN: [id number] Visit Date: 09/23/2021  Today's healthcare provider: Mikey Kirschner, PA-C   Cc. HTN and anxiety f/u  Subjective    HPI  Anxiety/Depression Wants to clarify if she should only e taking prozac or that and citalopram  Has been taking less than a half of celexa daily and has not started prozac Taking xanax 1/2 in afternoon, take 1 and 1/2 before bed  Stressful job and home life, daughter passed from stage Iv breast cancer.  PHQ9 SCORE ONLY 07/22/2021 10/16/2020 11/24/2019  PHQ-9 Total Score 0 8 8   GAD 7 : Generalized Anxiety Score 07/22/2021  Nervous, Anxious, on Edge 1  Control/stop worrying 0  Worry too much - different things 0  Trouble relaxing 0  Restless 0  Easily annoyed or irritable 0  Afraid - awful might happen 0  Total GAD 7 Score 1  Anxiety Difficulty Somewhat difficult    Hypertension, follow-up  BP Readings from Last 3 Encounters:  09/23/21 139/85  07/22/21 (!) 160/70  04/25/21 (!) 144/81   Wt Readings from Last 3 Encounters:  09/23/21 133 lb 12.8 oz (60.7 kg)  07/22/21 132 lb 14.4 oz (60.3 kg)  04/25/21 131 lb (59.4 kg)     She was last seen for hypertension 2 months ago.  BP at that visit was 160/70. Management since that visit includes start HCTZ 12.5mg   She reports excellent compliance with treatment. She is not having side effects.  She is following a Low Sodium diet. She is not exercising. She does smoke.  Outside blood pressures are 122/64 at 8:30 pm LA last night    Symptoms: No chest pain No chest pressure  No palpitations No syncope  No dyspnea No orthopnea  No paroxysmal nocturnal dyspnea No lower extremity edema   Pertinent labs: Lab Results  Component Value Date   CHOL 191 10/16/2020   HDL 39 (L) 10/16/2020   LDLCALC 87 10/16/2020   TRIG 399 (H) 10/16/2020   CHOLHDL 4.2 12/20/2017   Lab Results  Component Value Date    NA 140 10/16/2020   K 4.4 10/16/2020   CREATININE 0.97 10/16/2020   GFRNONAA 58 (L) 10/16/2020   GLUCOSE 98 10/16/2020   TSH 1.890 12/20/2017     The 10-year ASCVD risk score (Arnett DK, et al., 2019) is: 31.3%   ---------------------------------------------------------------------------------------------------   Medications: Outpatient Medications Prior to Visit  Medication Sig   hydrochlorothiazide (HYDRODIURIL) 12.5 MG tablet Take 1 tablet (12.5 mg total) by mouth daily.   ALPRAZolam (XANAX) 1 MG tablet Take 1 tablet (1 mg total) by mouth 3 (three) times daily as needed for anxiety.   FLUoxetine (PROZAC) 20 MG capsule Take 1 capsule (20 mg total) by mouth daily. (Patient not taking: Reported on 09/23/2021)   [DISCONTINUED] albuterol (VENTOLIN HFA) 108 (90 Base) MCG/ACT inhaler 2 INHALATIONS INTO LUNGS EVERY 4 HOURS AS NEEDED FOR SHORTNESS OF BREATH   No facility-administered medications prior to visit.    Review of Systems  Respiratory:  Negative for cough and shortness of breath.   Cardiovascular:  Negative for chest pain.  Neurological:  Negative for dizziness, light-headedness and numbness.  Psychiatric/Behavioral:  Positive for sleep disturbance. The patient is nervous/anxious.       Objective    Blood pressure 139/85, pulse 96, temperature 97.7 F (36.5 C), temperature source Oral, height 5\' 7"  (1.702 m), weight 133  lb 12.8 oz (60.7 kg), SpO2 100 %.    Physical Exam Constitutional:      General: She is awake.     Appearance: She is well-developed.  HENT:     Head: Normocephalic.  Eyes:     Conjunctiva/sclera: Conjunctivae normal.  Cardiovascular:     Rate and Rhythm: Normal rate and regular rhythm.     Heart sounds: Normal heart sounds.  Pulmonary:     Effort: Pulmonary effort is normal.     Breath sounds: Normal breath sounds.  Skin:    General: Skin is warm.  Neurological:     Mental Status: She is alert and oriented to person, place, and time.   Psychiatric:        Attention and Perception: Attention normal.        Mood and Affect: Mood normal.        Speech: Speech normal.        Behavior: Behavior is cooperative.     No results found for any visits on 09/23/21.  Assessment & Plan    Discussed mammo, declines for now, hesitancy to screen for cancers due to experience with daughter's stage IV breast cancer. Colonoscopy was ordered, declines for now  Problem List Items Addressed This Visit       Cardiovascular and Mediastinum   Primary hypertension - Primary    Improved on HCTZ 12.5 mg. Also is an element of white coat HTN Pt did not complete bw, reprinted and will get today Continue and will reassess at next f/u        Respiratory   Chronic obstructive pulmonary disease ([place])    Pulled for refill, pt likes to have on hand, currently no increase in symptoms no SOB Smokes 4 cigarettes daily, counseled on stopping      Relevant Medications   albuterol (VENTOLIN HFA) 108 (90 Base) MCG/ACT inhaler     Other   Depression, major, single episode, in partial remission (HCC)    Combined with GAD, I feel she is not answering GAD 7 and PHQ to the extent of what she is feeling. We discussed stopping Celexa for 3 days ( already nearly self weaned) and then starting Prozac. Discussed goal of feeling better in body, managing sadness and anxious feelings, and eventually cutting down on Xanax use.  First goal is stopping the afternoon dose of xanax. F/u 6 weeks        Return in about 6 weeks (around 11/04/2021) for anxiety, hypertension.      I, Mikey Kirschner, PA-C have reviewed all documentation for this visit. The documentation on  09/23/2021  for the exam, diagnosis, procedures, and orders are all accurate and complete.    Mikey Kirschner, PA-C  [facility] [phone] (phone) [phone] (fax)  [place]

## 2021-09-23 ENCOUNTER — Ambulatory Visit (INDEPENDENT_AMBULATORY_CARE_PROVIDER_SITE_OTHER): Payer: Medicare Other | Admitting: Physician Assistant

## 2021-09-23 ENCOUNTER — Encounter: Payer: Self-pay | Admitting: Physician Assistant

## 2021-09-23 ENCOUNTER — Other Ambulatory Visit: Payer: Self-pay

## 2021-09-23 VITALS — BP 139/85 | HR 96 | Temp 97.7°F | Ht 67.0 in | Wt 133.8 lb

## 2021-09-23 DIAGNOSIS — I10 Essential (primary) hypertension: Secondary | ICD-10-CM

## 2021-09-23 DIAGNOSIS — F324 Major depressive disorder, single episode, in partial remission: Secondary | ICD-10-CM

## 2021-09-23 DIAGNOSIS — Z862 Personal history of diseases of the blood and blood-forming organs and certain disorders involving the immune mechanism: Secondary | ICD-10-CM | POA: Diagnosis not present

## 2021-09-23 DIAGNOSIS — E7800 Pure hypercholesterolemia, unspecified: Secondary | ICD-10-CM | POA: Diagnosis not present

## 2021-09-23 DIAGNOSIS — J418 Mixed simple and mucopurulent chronic bronchitis: Secondary | ICD-10-CM | POA: Diagnosis not present

## 2021-09-23 MED ORDER — ALBUTEROL SULFATE HFA 108 (90 BASE) MCG/ACT IN AERS: 2.0000 | INHALATION_SPRAY | Freq: Four times a day (QID) | RESPIRATORY_TRACT | 1 refills | Status: DC | PRN

## 2021-09-23 NOTE — Assessment & Plan Note (Addendum)
Improved on HCTZ 12.5 mg. Also is an element of white coat HTN Pt did not complete bw, reprinted and will get today Continue and will reassess at next f/u

## 2021-09-23 NOTE — Assessment & Plan Note (Addendum)
Combined with GAD, I feel she is not answering GAD 7 and PHQ to the extent of what she is feeling. We discussed stopping Celexa for 3 days ( already nearly self weaned) and then starting Prozac. Discussed goal of feeling better in body, managing sadness and anxious feelings, and eventually cutting down on Xanax use.  First goal is stopping the afternoon dose of xanax. F/u 6 weeks

## 2021-09-23 NOTE — Assessment & Plan Note (Signed)
Pulled for refill, pt likes to have on hand, currently no increase in symptoms no SOB Smokes 4 cigarettes daily, counseled on stopping

## 2021-09-24 LAB — COMPREHENSIVE METABOLIC PANEL
ALT: 16 IU/L (ref 0–32)
AST: 19 IU/L (ref 0–40)
Albumin/Globulin Ratio: 1.5 (ref 1.2–2.2)
Albumin: 4.6 g/dL (ref 3.7–4.7)
Alkaline Phosphatase: 117 IU/L (ref 44–121)
BUN/Creatinine Ratio: 28 (ref 12–28)
BUN: 31 mg/dL — ABNORMAL HIGH (ref 8–27)
Bilirubin Total: 0.2 mg/dL (ref 0.0–1.2)
CO2: 21 mmol/L (ref 20–29)
Calcium: 10.4 mg/dL — ABNORMAL HIGH (ref 8.7–10.3)
Chloride: 103 mmol/L (ref 96–106)
Creatinine, Ser: 1.11 mg/dL — ABNORMAL HIGH (ref 0.57–1.00)
Globulin, Total: 3 g/dL (ref 1.5–4.5)
Glucose: 89 mg/dL (ref 70–99)
Potassium: 4.2 mmol/L (ref 3.5–5.2)
Sodium: 141 mmol/L (ref 134–144)
Total Protein: 7.6 g/dL (ref 6.0–8.5)
eGFR: 52 mL/min/{1.73_m2} — ABNORMAL LOW (ref >59)

## 2021-09-24 LAB — CBC
Hematocrit: 33.2 % — ABNORMAL LOW (ref 34.0–46.6)
Hemoglobin: 11 g/dL — ABNORMAL LOW (ref 11.1–15.9)
MCH: 32.9 pg (ref 26.6–33.0)
MCHC: 33.1 g/dL (ref 31.5–35.7)
MCV: 99 fL — ABNORMAL HIGH (ref 79–97)
Platelets: 551 10*3/uL — ABNORMAL HIGH (ref 150–450)
RBC: 3.34 x10E6/uL — ABNORMAL LOW (ref 3.77–5.28)
RDW: 11.9 % (ref 11.7–15.4)
WBC: 8.2 10*3/uL (ref 3.4–10.8)

## 2021-09-24 LAB — LIPID PANEL
Chol/HDL Ratio: 4.6 ratio — ABNORMAL HIGH (ref 0.0–4.4)
Cholesterol, Total: 161 mg/dL (ref 100–199)
HDL: 35 mg/dL — ABNORMAL LOW (ref >39)
LDL Chol Calc (NIH): 88 mg/dL (ref 0–99)
Triglycerides: 228 mg/dL — ABNORMAL HIGH (ref 0–149)
VLDL Cholesterol Cal: 38 mg/dL (ref 5–40)

## 2021-10-06 ENCOUNTER — Other Ambulatory Visit: Payer: Self-pay | Admitting: Physician Assistant

## 2021-10-06 ENCOUNTER — Other Ambulatory Visit: Payer: Self-pay | Admitting: Family Medicine

## 2021-10-07 NOTE — Telephone Encounter (Signed)
Requested Prescriptions  Pending Prescriptions Disp Refills   buPROPion (WELLBUTRIN XL) 150 MG 24 hr tablet [Pharmacy Med Name: BUPROPION HCL ER (XL) 150 MG TAB] 30 tablet     Sig: TAKE 1 TABLET BY MOUTH DAILY     Psychiatry: Antidepressants - bupropion Passed - 10/06/2021  4:29 PM      Passed - Completed PHQ-2 or PHQ-9 in the last 360 days      Passed - Last BP in normal range    BP Readings from Last 1 Encounters:  09/23/21 139/85         Passed - Valid encounter within last 6 months    Recent Outpatient Visits          2 weeks ago Primary hypertension   [employer], [place], PA-C   2 months ago Encounter for annual physical exam   [employer], Dionne Bucy, MD   5 months ago Left ankle swelling   [place], Lauren A, NP   11 months ago Depression, major, single episode, in partial remission [facility])   [place], [place], PA-C   1 year ago Depression, major, single episode, in partial remission [facility])   [place], Clearnce Sorrel, [place]      Future Appointments            In 4 weeks Drubel, Ria Comment, PA-C [employer], [facility]            hydrochlorothiazide (HYDRODIURIL) 12.5 MG tablet [employer] Med Name: HYDROCHLOROTHIAZIDE 12.5 MG TAB] 30 tablet 3    Sig: TAKE ONE TABLET BY MOUTH EVERY DAY     Cardiovascular: Diuretics - Thiazide Failed - 10/06/2021  4:29 PM      Failed - Ca in normal range and within 360 days    Calcium  Date Value Ref Range Status  09/23/2021 10.4 (H) 8.7 - 10.3 mg/dL Final         Failed - Cr in normal range and within 360 days    Creatinine, Ser  Date Value Ref Range Status  09/23/2021 1.11 (H) 0.57 - 1.00 mg/dL Final         Passed - K in normal range and within 360 days    Potassium  Date Value Ref Range Status  09/23/2021 4.2 3.5 - 5.2 mmol/L Final         Passed - Na in normal range and within 360 days     Sodium  Date Value Ref Range Status  09/23/2021 141 134 - 144 mmol/L Final         Passed - Last BP in normal range    BP Readings from Last 1 Encounters:  09/23/21 139/85         Passed - Valid encounter within last 6 months    Recent Outpatient Visits          2 weeks ago Primary hypertension   [place] Mikey Kirschner, PA-C   2 months ago Encounter for annual physical exam   [employer], Dionne Bucy, MD   5 months ago Left ankle swelling   [place], Lauren A, NP   11 months ago Depression, major, single episode, in partial remission [facility])   [place], [place], PA-C   1 year ago Depression, major, single episode, in partial remission [facility])   [place], Clearnce Sorrel, [place]      Future Appointments  In 4 weeks Drubel, Ria Comment, PA-C [facility], [facility]

## 2021-10-07 NOTE — Telephone Encounter (Signed)
Requested medications are due for refill today.  Unsure  Requested medications are on the active medications list.  no  Last refill. 03/10/2021  Future visit scheduled.   yes  Notes to clinic.  Per note from 07/22/2021 pt is not taking this medication.    Requested Prescriptions  Pending Prescriptions Disp Refills   buPROPion (WELLBUTRIN XL) 150 MG 24 hr tablet [Pharmacy Med Name: BUPROPION HCL ER (XL) 150 MG TAB] 30 tablet     Sig: TAKE 1 TABLET BY MOUTH DAILY     Psychiatry: Antidepressants - bupropion Passed - 10/06/2021  4:29 PM      Passed - Completed PHQ-2 or PHQ-9 in the last 360 days      Passed - Last BP in normal range    BP Readings from Last 1 Encounters:  09/23/21 139/85          Passed - Valid encounter within last 6 months    Recent Outpatient Visits           2 weeks ago Primary hypertension   [employer], [place], PA-C   2 months ago Encounter for annual physical exam   [facility], Marzella Schlein, MD   5 months ago Left ankle swelling   [facility] [place], Lauren A, NP   11 months ago Depression, major, single episode, in partial remission [facility])   [facility] [place], [place], PA-C   1 year ago Depression, major, single episode, in partial remission [facility])   [facility], Alessandra Bevels, [place]       Future Appointments             In 4 weeks Drubel, Lillia Abed, PA-C [employer], [facility]            Signed Prescriptions Disp Refills   hydrochlorothiazide (HYDRODIURIL) 12.5 MG tablet 30 tablet 3    Sig: TAKE ONE TABLET BY MOUTH EVERY DAY     Cardiovascular: Diuretics - Thiazide Failed - 10/06/2021  4:29 PM      Failed - Ca in normal range and within 360 days    Calcium  Date Value Ref Range Status  09/23/2021 10.4 (H) 8.7 - 10.3 mg/dL Final          Failed - Cr in normal range and within 360 days    Creatinine, Ser  Date Value Ref Range  Status  09/23/2021 1.11 (H) 0.57 - 1.00 mg/dL Final          Passed - K in normal range and within 360 days    Potassium  Date Value Ref Range Status  09/23/2021 4.2 3.5 - 5.2 mmol/L Final          Passed - Na in normal range and within 360 days    Sodium  Date Value Ref Range Status  09/23/2021 141 134 - 144 mmol/L Final          Passed - Last BP in normal range    BP Readings from Last 1 Encounters:  09/23/21 139/85          Passed - Valid encounter within last 6 months    Recent Outpatient Visits           2 weeks ago Primary hypertension   [facility] Alfredia Ferguson, PA-C   2 months ago Encounter for annual physical exam   [facility] [place], Marzella Schlein, MD   5 months ago Left ankle swelling  [place], Lauren A, NP   11 months ago Depression, major, single episode, in partial remission [facility])   [place], [place], [place]   1 year ago Depression, major, single episode, in partial remission [facility])   [place], Clearnce Sorrel, [place]       Future Appointments             In 4 weeks Thedore Mins, Ria Comment, PA-C [employer], [place]

## 2021-10-08 ENCOUNTER — Other Ambulatory Visit: Payer: Self-pay | Admitting: Family Medicine

## 2021-10-08 DIAGNOSIS — F419 Anxiety disorder, unspecified: Secondary | ICD-10-CM

## 2021-10-08 NOTE — Telephone Encounter (Signed)
LOV: 09/23/2021  NOV: 11/04/2021  Last Refill: 07/22/2021 #90 1 Refill   Thanks,   Marland KitchenMickel Baas

## 2021-10-09 NOTE — Telephone Encounter (Signed)
pdmp checked last fill 09/04/2021

## 2021-10-28 ENCOUNTER — Ambulatory Visit (INDEPENDENT_AMBULATORY_CARE_PROVIDER_SITE_OTHER): Payer: Medicare Other

## 2021-10-28 ENCOUNTER — Other Ambulatory Visit: Payer: Self-pay

## 2021-10-28 VITALS — BP 134/62 | HR 92 | Temp 98.8°F | Ht 67.0 in | Wt 133.3 lb

## 2021-10-28 DIAGNOSIS — Z0000 Encounter for general adult medical examination without abnormal findings: Secondary | ICD-10-CM | POA: Diagnosis not present

## 2021-10-28 NOTE — Progress Notes (Signed)
Subjective:   Molly Leonard is a 76 y.o. female who presents for Medicare Annual (Subsequent) preventive examination.  Review of Systems           Objective:    Today's Vitals   10/28/21 1436  BP: 134/62  Pulse: 92  Temp: 98.8 F (37.1 C)  TempSrc: Oral  SpO2: 99%  Weight: 133 lb 4.8 oz (60.5 kg)  Height: 5\' 7"  (1.702 m)   Body mass index is 20.88 kg/m.  Advanced Directives 10/22/2020 10/18/2019 12/20/2017 12/20/2017 01/18/2017 11/08/2016 12/04/2015  Does Patient Have a Medical Advance Directive? No No No Yes Yes Yes Yes  Type of Advance Directive - - - Living will Healthcare Power of Lane;Living will - Living will;Healthcare Power of Attorney  Does patient want to make changes to medical advance directive? - - No - Patient declined - - - -  Would patient like information on creating a medical advance directive? No - Patient declined No - Patient declined - - - - -    Current Medications (verified) Outpatient Encounter Medications as of 10/28/2021  Medication Sig   albuterol (VENTOLIN HFA) 108 (90 Base) MCG/ACT inhaler Inhale 2 puffs into the lungs every 6 (six) hours as needed for wheezing or shortness of breath.   ALPRAZolam (XANAX) 1 MG tablet TAKE ONE TABLET BY MOUTH 3 TIMES DAILY AS NEEDED FOR ANXIETY   buPROPion (WELLBUTRIN XL) 150 MG 24 hr tablet Take 150 mg by mouth daily.   citalopram (CELEXA) 10 MG tablet Take 5 mg by mouth daily.   FLUoxetine (PROZAC) 20 MG capsule Take 1 capsule (20 mg total) by mouth daily. (Patient not taking: Reported on 09/23/2021)   hydrochlorothiazide (HYDRODIURIL) 12.5 MG tablet TAKE ONE TABLET BY MOUTH EVERY DAY   No facility-administered encounter medications on file as of 10/28/2021.    Allergies (verified) Patient has no known allergies.   History: Past Medical History:  Diagnosis Date   Anxiety    Depression    Past Surgical History:  Procedure Laterality Date   ABDOMINAL HYSTERECTOMY  1973   Family History  Problem Relation  Age of Onset   Heart disease Mother    Diabetes Mother    Multiple sclerosis Sister    Bone cancer Brother    Social History   Socioeconomic History   Marital status: Married    Spouse name: Not on file   Number of children: 3   Years of education: Not on file   Highest education level: 12th grade  Occupational History   Occupation: 1974    Comment: full time  Tobacco Use   Smoking status: Every Day    Packs/day: 0.75    Types: Cigarettes   Smokeless tobacco: Never  Vaping Use   Vaping Use: Former  Substance and Sexual Activity   Alcohol use: No   Drug use: No   Sexual activity: Not on file  Other Topics Concern   Not on file  Social History Narrative   Not on file   Social Determinants of Health   Financial Resource Strain: Not on file  Food Insecurity: Not on file  Transportation Needs: Not on file  Physical Activity: Not on file  Stress: Not on file  Social Connections: Not on file    Tobacco Counseling Ready to quit: Not Answered Counseling given: Not Answered   Clinical Intake:  Pre-visit preparation completed: Yes  Pain : No/denies pain     Nutritional Risks: None Diabetes: No  How often do  you need to have someone help you when you read instructions, pamphlets, or other written materials from your doctor or pharmacy?: 1 - Never  Diabetic?no  Interpreter Needed?: No  Information entered by :: Molly Bucker, LPN   Activities of Daily Living In your present state of health, do you have any difficulty performing the following activities: 07/22/2021  Hearing? N  Vision? Y  Difficulty concentrating or making decisions? N  Walking or climbing stairs? N  Dressing or bathing? N  Doing errands, shopping? N  Some recent data might be hidden    Patient Care Team: Alfredia Ferguson, PA-C as PCP - General (Physician Assistant) Domingo Madeira, OD (Optometry)  Indicate any recent Medical Services you may have received from other than [facility]  providers in the past year (date may be approximate).     Assessment:   This is a routine wellness examination for Molly Leonard.  Hearing/Vision screen No results found.  Dietary issues and exercise activities discussed:     Goals Addressed   None    Depression Screen PHQ 2/9 Scores 07/22/2021 10/16/2020 11/24/2019 10/18/2019 10/18/2019 12/20/2017 01/18/2017  PHQ - 2 Score 0 2 5 1 1  0 4  PHQ- 9 Score 0 8 8 - - - 16    Fall Risk Fall Risk  07/22/2021 10/22/2020 10/16/2020 10/18/2019 12/20/2017  Falls in the past year? 0 0 0 0 No  Number falls in past yr: 0 0 0 0 -  Injury with Fall? 0 0 0 0 -  Risk for fall due to : - - No Fall Risks - -  Follow up - - Falls evaluation completed - -    FALL RISK PREVENTION PERTAINING TO THE HOME:  Any stairs in or around the home? No  If so, are there any without handrails? No  Home free of loose throw rugs in walkways, pet beds, electrical cords, etc? Yes  Adequate lighting in your home to reduce risk of falls? Yes   ASSISTIVE DEVICES UTILIZED TO PREVENT FALLS:  Life alert? No  Use of a cane, walker or w/c? No  Grab bars in the bathroom? No  Shower chair or bench in shower? Yes  Elevated toilet seat or a handicapped toilet? Yes   TIMED UP AND GO:  Was the test performed? Yes .  Length of time to ambulate 10 feet: 4 sec.   Gait steady and fast without use of assistive device  Cognitive Function:        Immunizations Immunization History  Administered Date(s) Administered   Fluad Quad(high Dose 65+) 06/20/2019   Influenza Split 07/22/2016   Influenza, High Dose Seasonal PF 07/12/2015, 06/16/2017   Influenza-Unspecified 06/16/2017, 06/04/2018, 07/14/2021   PFIZER(Purple Top)SARS-COV-2 Vaccination 09/29/2019, 10/20/2019, 07/09/2020   Pneumococcal Conjugate-13 10/06/2013   Pneumococcal Polysaccharide-23 10/17/2014   Tdap 06/17/2011    TDAP status: Due, Education has been provided regarding the importance of this vaccine. Advised may receive this  vaccine at local pharmacy or Health Dept. Aware to provide a copy of the vaccination record if obtained from local pharmacy or Health Dept. Verbalized acceptance and understanding.  Flu Vaccine status: Up to date  Pneumococcal vaccine status: Up to date  Covid-19 vaccine status: Completed vaccines  Qualifies for Shingles Vaccine? Yes   Zostavax completed No   Shingrix Completed?: No.    Education has been provided regarding the importance of this vaccine. Patient has been advised to call insurance company to determine out of pocket expense if they have not yet  received this vaccine. Advised may also receive vaccine at local pharmacy or Health Dept. Verbalized acceptance and understanding.  Screening Tests Health Maintenance  Topic Date Due   Zoster Vaccines- Shingrix (1 of 2) Never done   COLONOSCOPY (Pts 45-61yrs Insurance coverage will need to be confirmed)  Never done   DEXA SCAN  03/04/2011   MAMMOGRAM  10/21/2017   COVID-19 Vaccine (4 - Booster for Pfizer series) 09/03/2020   TETANUS/TDAP  06/16/2021   Pneumonia Vaccine 89+ Years old  Completed   INFLUENZA VACCINE  Completed   Hepatitis C Screening  Completed   HPV VACCINES  Aged Out    Health Maintenance  Health Maintenance Due  Topic Date Due   Zoster Vaccines- Shingrix (1 of 2) Never done   COLONOSCOPY (Pts 45-65yrs Insurance coverage will need to be confirmed)  Never done   DEXA SCAN  03/04/2011   MAMMOGRAM  10/21/2017   COVID-19 Vaccine (4 - Booster for Pfizer series) 09/03/2020   TETANUS/TDAP  06/16/2021    Colorectal cancer screening: Type of screening: Colonoscopy. Completed no. Repeat every   years- declines referral  Mammogram status: Completed 10/22/15. Repeat every year- declines referral  Bone Density status: Completed 03/03/06. Results reflect: Bone density results: NORMAL. Repeat every 5 years.- declines referral  Lung Cancer Screening: (Low Dose CT Chest recommended if Age 75-80 years, 30 pack-year  currently smoking OR have quit w/in 15years.) does not qualify.   Lung Cancer Screening Referral: declines referral  Additional Screening:  Hepatitis C Screening: does qualify; Completed 12/20/17  Vision Screening: Recommended annual ophthalmology exams for early detection of glaucoma and other disorders of the eye. Is the patient up to date with their annual eye exam?  Yes  Who is the provider or what is the name of the office in which the patient attends annual eye exams? Dr.NICE If pt is not established with a provider, would they like to be referred to a provider to establish care? No .   Dental Screening: Recommended annual dental exams for proper oral hygiene  Community Resource Referral / Chronic Care Management: CRR required this visit?  No   CCM required this visit?  No      Plan:     I have personally reviewed and noted the following in the patient's chart:   Medical and social history Use of alcohol, tobacco or illicit drugs  Current medications and supplements including opioid prescriptions.  Functional ability and status Nutritional status Physical activity Advanced directives List of other physicians Hospitalizations, surgeries, and ER visits in previous 12 months Vitals Screenings to include cognitive, depression, and falls Referrals and appointments  In addition, I have reviewed and discussed with patient certain preventive protocols, quality metrics, and best practice recommendations. A written personalized care plan for preventive services as well as general preventive health recommendations were provided to patient.     Hal Hope, LPN   1/61/0960   Nurse Notes: none

## 2021-10-28 NOTE — Patient Instructions (Signed)
[name] , Thank you for taking time to come for your Medicare Wellness Visit. I appreciate your ongoing commitment to your health goals. Please review the following plan we discussed and let me know if I can assist you in the future.   Screening recommendations/referrals: Colonoscopy: n/d, declined referral Mammogram: 10/22/15, declined referral Bone Density: 03/03/06, declined referral Recommended yearly ophthalmology/optometry visit for glaucoma screening and checkup Recommended yearly dental visit for hygiene and checkup  Vaccinations: Influenza vaccine: 07/14/21 Pneumococcal vaccine: 10/17/14 Tdap vaccine: 06/17/11, due Shingles vaccine: n/d   Covid-19:09/29/19, 10/20/19, 07/09/20  Advanced directives: no  Conditions/risks identified: none  Next appointment: Follow up in one year for your annual wellness visit - 10/29/22 @ 1pm in person   Preventive Care 65 Years and Older, Female Preventive care refers to lifestyle choices and visits with your health care provider that can promote health and wellness. What does preventive care include? A yearly physical exam. This is also called an annual well check. Dental exams once or twice a year. Routine eye exams. Ask your health care provider how often you should have your eyes checked. Personal lifestyle choices, including: Daily care of your teeth and gums. Regular physical activity. Eating a healthy diet. Avoiding tobacco and drug use. Limiting alcohol use. Practicing safe sex. Taking low-dose aspirin every day. Taking vitamin and mineral supplements as recommended by your health care provider. What happens during an annual well check? The services and screenings done by your health care provider during your annual well check will depend on your age, overall health, lifestyle risk factors, and family history of disease. Counseling  Your health care provider may ask you questions about your: Alcohol use. Tobacco use. Drug  use. Emotional well-being. Home and relationship well-being. Sexual activity. Eating habits. History of falls. Memory and ability to understand (cognition). Work and work Astronomer. Reproductive health. Screening  You may have the following tests or measurements: Height, weight, and BMI. Blood pressure. Lipid and cholesterol levels. These may be checked every 5 years, or more frequently if you are over 13 years old. Skin check. Lung cancer screening. You may have this screening every year starting at age 63 if you have a 30-pack-year history of smoking and currently smoke or have quit within the past 15 years. Fecal occult blood test (FOBT) of the stool. You may have this test every year starting at age 20. Flexible sigmoidoscopy or colonoscopy. You may have a sigmoidoscopy every 5 years or a colonoscopy every 10 years starting at age 24. Hepatitis C blood test. Hepatitis B blood test. Sexually transmitted disease (STD) testing. Diabetes screening. This is done by checking your blood sugar (glucose) after you have not eaten for a while (fasting). You may have this done every 1-3 years. Bone density scan. This is done to screen for osteoporosis. You may have this done starting at age 50. Mammogram. This may be done every 1-2 years. Talk to your health care provider about how often you should have regular mammograms. Talk with your health care provider about your test results, treatment options, and if necessary, the need for more tests. Vaccines  Your health care provider may recommend certain vaccines, such as: Influenza vaccine. This is recommended every year. Tetanus, diphtheria, and acellular pertussis (Tdap, Td) vaccine. You may need a Td booster every 10 years. Zoster vaccine. You may need this after age 75. Pneumococcal 13-valent conjugate (PCV13) vaccine. One dose is recommended after age 77. Pneumococcal polysaccharide (PPSV23) vaccine. One dose is recommended after  age  44. Talk to your health care provider about which screenings and vaccines you need and how often you need them. This information is not intended to replace advice given to you by your health care provider. Make sure you discuss any questions you have with your health care provider. Document Released: 09/27/2015 Document Revised: 05/20/2016 Document Reviewed: 07/02/2015 Elsevier Interactive Patient Education  2017 [place] Prevention in the Home Falls can cause injuries. They can happen to people of all ages. There are many things you can do to make your home safe and to help prevent falls. What can I do on the outside of my home? Regularly fix the edges of walkways and driveways and fix any cracks. Remove anything that might make you trip as you walk through a door, such as a raised step or threshold. Trim any bushes or trees on the path to your home. Use bright outdoor lighting. Clear any walking paths of anything that might make someone trip, such as rocks or tools. Regularly check to see if handrails are loose or broken. Make sure that both sides of any steps have handrails. Any raised decks and porches should have guardrails on the edges. Have any leaves, snow, or ice cleared regularly. Use sand or salt on walking paths during winter. Clean up any spills in your garage right away. This includes oil or grease spills. What can I do in the bathroom? Use night lights. Install grab bars by the toilet and in the tub and shower. Do not use towel bars as grab bars. Use non-skid mats or decals in the tub or shower. If you need to sit down in the shower, use a plastic, non-slip stool. Keep the floor dry. Clean up any water that spills on the floor as soon as it happens. Remove soap buildup in the tub or shower regularly. Attach bath mats securely with double-sided non-slip rug tape. Do not have throw rugs and other things on the floor that can make you trip. What can I do in the  bedroom? Use night lights. Make sure that you have a light by your bed that is easy to reach. Do not use any sheets or blankets that are too big for your bed. They should not hang down onto the floor. Have a firm chair that has side arms. You can use this for support while you get dressed. Do not have throw rugs and other things on the floor that can make you trip. What can I do in the kitchen? Clean up any spills right away. Avoid walking on wet floors. Keep items that you use a lot in easy-to-reach places. If you need to reach something above you, use a strong step stool that has a grab bar. Keep electrical cords out of the way. Do not use floor polish or wax that makes floors slippery. If you must use wax, use non-skid floor wax. Do not have throw rugs and other things on the floor that can make you trip. What can I do with my stairs? Do not leave any items on the stairs. Make sure that there are handrails on both sides of the stairs and use them. Fix handrails that are broken or loose. Make sure that handrails are as long as the stairways. Check any carpeting to make sure that it is firmly attached to the stairs. Fix any carpet that is loose or worn. Avoid having throw rugs at the top or bottom of the stairs. If you do  have throw rugs, attach them to the floor with carpet tape. Make sure that you have a light switch at the top of the stairs and the bottom of the stairs. If you do not have them, ask someone to add them for you. What else can I do to help prevent falls? Wear shoes that: Do not have high heels. Have rubber bottoms. Are comfortable and fit you well. Are closed at the toe. Do not wear sandals. If you use a stepladder: Make sure that it is fully opened. Do not climb a closed stepladder. Make sure that both sides of the stepladder are locked into place. Ask someone to hold it for you, if possible. Clearly mark and make sure that you can see: Any grab bars or  handrails. First and last steps. Where the edge of each step is. Use tools that help you move around (mobility aids) if they are needed. These include: Canes. Walkers. Scooters. Crutches. Turn on the lights when you go into a dark area. Replace any light bulbs as soon as they burn out. Set up your furniture so you have a clear path. Avoid moving your furniture around. If any of your floors are uneven, fix them. If there are any pets around you, be aware of where they are. Review your medicines with your doctor. Some medicines can make you feel dizzy. This can increase your chance of falling. Ask your doctor what other things that you can do to help prevent falls. This information is not intended to replace advice given to you by your health care provider. Make sure you discuss any questions you have with your health care provider. Document Released: 06/27/2009 Document Revised: 02/06/2016 Document Reviewed: 10/05/2014 Elsevier Interactive Patient Education  2017 [employer].

## 2021-11-04 ENCOUNTER — Other Ambulatory Visit: Payer: Self-pay

## 2021-11-04 ENCOUNTER — Encounter: Payer: Self-pay | Admitting: Physician Assistant

## 2021-11-04 ENCOUNTER — Ambulatory Visit (INDEPENDENT_AMBULATORY_CARE_PROVIDER_SITE_OTHER): Payer: Medicare Other | Admitting: Physician Assistant

## 2021-11-04 VITALS — BP 146/69 | HR 89 | Resp 16 | Wt 133.2 lb

## 2021-11-04 DIAGNOSIS — F339 Major depressive disorder, recurrent, unspecified: Secondary | ICD-10-CM | POA: Diagnosis not present

## 2021-11-04 DIAGNOSIS — I10 Essential (primary) hypertension: Secondary | ICD-10-CM

## 2021-11-04 NOTE — Progress Notes (Signed)
°  ° ° °Established patient visit ° ° °Patient: Molly Leonard   DOB: 09/03/1946   76 y.o. Female  MRN: [id number] °Visit Date: 11/04/2021 ° °Today's healthcare provider: Lindsay Drubel, PA-C  ° °Chief Complaint  °Patient presents with  ° Hypertension  ° Anxiety  ° °Subjective  °  °HPI  °Hypertension, follow-up ° °BP Readings from Last 3 Encounters:  °11/04/21 (!) 146/69  °10/28/21 134/62  °09/23/21 139/85  ° Wt Readings from Last 3 Encounters:  °11/04/21 133 lb 3.2 oz (60.4 kg)  °10/28/21 133 lb 4.8 oz (60.5 kg)  °09/23/21 133 lb 12.8 oz (60.7 kg)  °  ° °She was last seen for hypertension 1 months ago.  °BP at that visit was 139/85. Management since that visit includes none, labs ordered. ° °She reports excellent compliance with treatment. °She is not having side effects. °She is following a Regular diet. °She is not exercising. °She does smoke. ° °Use of agents associated with hypertension: none.  ° °Outside blood pressures are not checked. °Symptoms: °No chest pain No chest pressure  °No palpitations No syncope  °No dyspnea No orthopnea  °No paroxysmal nocturnal dyspnea No lower extremity edema  ° °Pertinent labs: °Lab Results  °Component Value Date  ° CHOL 161 09/23/2021  ° HDL 35 (L) 09/23/2021  ° LDLCALC 88 09/23/2021  ° TRIG 228 (H) 09/23/2021  ° CHOLHDL 4.6 (H) 09/23/2021  ° Lab Results  °Component Value Date  ° NA 141 09/23/2021  ° K 4.2 09/23/2021  ° CREATININE 1.11 (H) 09/23/2021  ° EGFR 52 (L) 09/23/2021  ° GLUCOSE 89 09/23/2021  ° TSH 1.890 12/20/2017  °  ° °The 10-year ASCVD risk score (Arnett DK, et al., 2019) is: 33.4%  ° °---------------------------------------------------------------------------------------------------  °Anxiety, Follow-up ° °She was last seen for anxiety 1 months ago. °Changes made at last visit include d/c celexa. Starting prozac 20. Discussed cutting down on xanax. °  °She reports good compliance with treatment. °She reports good tolerance of treatment. °She is not having side  effects.  ° °She feels her anxiety is moderate may be a little better than previously. °She admits to SI on a daily basis in confidence. States she is still around for her granddaughter's sake.  ° °Symptoms: °No chest pain No difficulty concentrating  °No dizziness No fatigue  °No feelings of losing control No insomnia  °No irritable No palpitations  °No panic attacks No racing thoughts  °No shortness of breath No sweating  °No tremors/shakes   ° °GAD-7 Results °GAD-7 Generalized Anxiety Disorder Screening Tool 11/04/2021 07/22/2021  °1. Feeling Nervous, Anxious, or on Edge 2 1  °2. Not Being Able to Stop or Control Worrying 3 0  °3. Worrying Too Much About Different Things 3 0  °4. Trouble Relaxing (No Data) 0  °5. Being So Restless it's Hard To Sit Still 0 0  °6. Becoming Easily Annoyed or Irritable 0 0  °7. Feeling Afraid As If Something Awful Might Happen 0 0  °Total GAD-7 Score - 1  °Difficulty At Work, Home, or Getting  Along With Others? - Somewhat difficult  ° ° °PHQ-9 Scores °PHQ9 SCORE ONLY 11/04/2021 10/28/2021 07/22/2021  °PHQ-9 Total Score 3 0 0  ° ° °---------------------------------------------------------------------------------------------------  ° °Medications: °Outpatient Medications Prior to Visit  °Medication Sig  ° albuterol (VENTOLIN HFA) 108 (90 Base) MCG/ACT inhaler Inhale 2 puffs into the lungs every 6 (six) hours as needed for wheezing or shortness of breath.  ° ALPRAZolam (XANAX) 1   1 MG tablet TAKE ONE TABLET BY MOUTH 3 TIMES DAILY AS NEEDED FOR ANXIETY   FLUoxetine (PROZAC) 20 MG capsule Take 1 capsule (20 mg total) by mouth daily.   hydrochlorothiazide (HYDRODIURIL) 12.5 MG tablet TAKE ONE TABLET BY MOUTH EVERY DAY   [DISCONTINUED] buPROPion (WELLBUTRIN XL) 150 MG 24 hr tablet Take 150 mg by mouth daily. (Patient not taking: Reported on 10/28/2021)   [DISCONTINUED] citalopram (CELEXA) 10 MG tablet Take 5 mg by mouth daily. (Patient not taking: Reported on 10/28/2021)   No  facility-administered medications prior to visit.    Review of Systems  Constitutional:  Negative for fatigue and fever.  Respiratory:  Negative for cough and shortness of breath.   Cardiovascular:  Negative for chest pain and leg swelling.  Gastrointestinal:  Negative for abdominal pain.  Neurological:  Negative for dizziness and headaches.  Psychiatric/Behavioral:  Positive for suicidal ideas. The patient is nervous/anxious.       Objective    BP (!) 146/69    Pulse 89    Resp 16    Wt 133 lb 3.2 oz (60.4 kg)    SpO2 100%    BMI 20.86 kg/m    Physical Exam Constitutional:      General: She is awake.     Appearance: She is well-developed.  HENT:     Head: Normocephalic.  Eyes:     Conjunctiva/sclera: Conjunctivae normal.  Cardiovascular:     Rate and Rhythm: Normal rate and regular rhythm.     Heart sounds: Normal heart sounds.  Pulmonary:     Effort: Pulmonary effort is normal.     Breath sounds: Normal breath sounds.  Skin:    General: Skin is warm.  Neurological:     Mental Status: She is alert and oriented to person, place, and time.  Psychiatric:        Attention and Perception: Attention normal.        Mood and Affect: Mood normal.        Speech: Speech normal.        Behavior: Behavior is cooperative.     No results found for any visits on 11/04/21.  Assessment & Plan     Problem List Items Addressed This Visit       Cardiovascular and Mediastinum   White coat syndrome with hypertension - Primary    Will continue to monitor. Pt does not like to check at home. Visibly anxious in office.  Pt dislikes coming to office frequently        Other   Depression, recurrent ([place])    Along w/ GAD. GAD7 and PHQ recorded as pt documented despite inaccuracy. No changes in xanax use for now. Will stay on Prozac 20, discussed option to increase to 40 mg as well. She declines medication increase for the time being. Again discussed therapy/psychiatry, pt declines.  Discussed pt's reasons for living, encouraged her to reach out if thoughts of SI become more than transient thoughts. No active plan.         Return in about 3 months (around 02/01/2022) for hypertension, anxiety.     I, Mikey Kirschner, PA-C have reviewed all documentation for this visit. The documentation on 11/04/2021 for the exam, diagnosis, procedures, and orders are all accurate and complete.   Mikey Kirschner, PA-C  [facility] [phone] (phone) [phone] (fax)  [place]

## 2021-11-04 NOTE — Assessment & Plan Note (Addendum)
Along w/ GAD. GAD7 and PHQ recorded as pt documented despite inaccuracy. No changes in xanax use for now. Will stay on Prozac 20, discussed option to increase to 40 mg as well. She declines medication increase for the time being. Again discussed therapy/psychiatry, pt declines. Discussed pt's reasons for living, encouraged her to reach out if thoughts of SI become more than transient thoughts. No active plan.

## 2021-11-04 NOTE — Assessment & Plan Note (Addendum)
Will continue to monitor. Pt does not like to check at home. Visibly anxious in office.  Pt dislikes coming to office frequently

## 2021-11-13 ENCOUNTER — Other Ambulatory Visit: Payer: Self-pay | Admitting: Physician Assistant

## 2021-11-13 DIAGNOSIS — F419 Anxiety disorder, unspecified: Secondary | ICD-10-CM

## 2021-11-13 NOTE — Telephone Encounter (Signed)
pdmp checked last fill 10/09/2021 ?Ongoing discussion w/ patient to decrease usage ?She is attempting ?

## 2021-12-01 ENCOUNTER — Other Ambulatory Visit: Payer: Self-pay | Admitting: Physician Assistant

## 2021-12-19 ENCOUNTER — Other Ambulatory Visit: Payer: Self-pay | Admitting: Physician Assistant

## 2021-12-19 DIAGNOSIS — F419 Anxiety disorder, unspecified: Secondary | ICD-10-CM

## 2022-01-06 ENCOUNTER — Other Ambulatory Visit: Payer: Self-pay | Admitting: Family Medicine

## 2022-01-06 ENCOUNTER — Other Ambulatory Visit: Payer: Self-pay | Admitting: Physician Assistant

## 2022-01-23 ENCOUNTER — Other Ambulatory Visit: Payer: Self-pay | Admitting: Physician Assistant

## 2022-01-23 DIAGNOSIS — F419 Anxiety disorder, unspecified: Secondary | ICD-10-CM

## 2022-02-02 ENCOUNTER — Encounter: Payer: Self-pay | Admitting: Physician Assistant

## 2022-02-02 ENCOUNTER — Ambulatory Visit (INDEPENDENT_AMBULATORY_CARE_PROVIDER_SITE_OTHER): Payer: Medicare Other | Admitting: Physician Assistant

## 2022-02-02 ENCOUNTER — Other Ambulatory Visit: Payer: Self-pay | Admitting: Family Medicine

## 2022-02-02 VITALS — BP 127/72 | HR 76 | Temp 98.4°F | Resp 16 | Ht 67.0 in | Wt 133.1 lb

## 2022-02-02 DIAGNOSIS — I10 Essential (primary) hypertension: Secondary | ICD-10-CM

## 2022-02-02 DIAGNOSIS — F419 Anxiety disorder, unspecified: Secondary | ICD-10-CM | POA: Diagnosis not present

## 2022-02-02 DIAGNOSIS — F339 Major depressive disorder, recurrent, unspecified: Secondary | ICD-10-CM

## 2022-02-02 MED ORDER — FLUOXETINE HCL 20 MG PO CAPS: ORAL_CAPSULE | ORAL | 1 refills | Status: DC

## 2022-02-02 NOTE — Progress Notes (Deleted)
I,Tiffany J Bragg,acting as a Education administrator for [employer], PA-C.,have documented all relevant documentation on the behalf of Mikey Kirschner, PA-C,as directed by  Mikey Kirschner, PA-C while in the presence of Mikey Kirschner, PA-C.   Established patient visit   Patient: Molly Leonard   DOB: [birth year]   76 y.o. Female  MRN: [id number] Visit Date: 02/02/2022  Today's healthcare provider: Mikey Kirschner, PA-C   Chief Complaint  Patient presents with   Hypertension   Anxiety   Subjective    HPI  Hypertension, follow-up  BP Readings from Last 3 Encounters:  02/02/22 127/72  11/04/21 (!) 146/69  10/28/21 134/62   Wt Readings from Last 3 Encounters:  02/02/22 133 lb 1.6 oz (60.4 kg)  11/04/21 133 lb 3.2 oz (60.4 kg)  10/28/21 133 lb 4.8 oz (60.5 kg)     She was last seen for hypertension 3 months ago.  BP at that visit was 146/69. Management since that visit includes continue to monitor.  She reports excellent compliance with treatment. She is not having side effects.  She is following a Regular diet. She is not exercising. She does smoke.  Use of agents associated with hypertension: none.   Outside blood pressures are not checked. Symptoms: No chest pain No chest pressure  No palpitations No syncope  No dyspnea No orthopnea  No paroxysmal nocturnal dyspnea No lower extremity edema   Pertinent labs Lab Results  Component Value Date   CHOL 161 09/23/2021   HDL 35 (L) 09/23/2021   LDLCALC 88 09/23/2021   TRIG 228 (H) 09/23/2021   CHOLHDL 4.6 (H) 09/23/2021   Lab Results  Component Value Date   NA 141 09/23/2021   K 4.2 09/23/2021   CREATININE 1.11 (H) 09/23/2021   EGFR 52 (L) 09/23/2021   GLUCOSE 89 09/23/2021   TSH 1.890 12/20/2017     The 10-year ASCVD risk score (Arnett DK, et al., 2019) is: 28.3%  --------------------------------------------------------------------------------------------------- Anxiety, Follow-up  She was last seen for anxiety 3  months ago. Changes made at last visit include no changes in xanax use for now. Will stay on Prozac 20.   She reports excellent compliance with treatment. She reports excellent tolerance of treatment. She is not having side effects.   She feels her anxiety is moderate and Unchanged since last visit.  Symptoms: No chest pain No difficulty concentrating  No dizziness No fatigue  No feelings of losing control No insomnia  No irritable No palpitations  No panic attacks No racing thoughts  No shortness of breath No sweating  No tremors/shakes    GAD-7 Results    11/04/2021    9:18 AM 07/22/2021   11:35 AM  GAD-7 Generalized Anxiety Disorder Screening Tool  1. Feeling Nervous, Anxious, or on Edge 2 1  2. Not Being Able to Stop or Control Worrying 3 0  3. Worrying Too Much About Different Things 3 0  4. Trouble Relaxing  0  5. Being So Restless it's Hard To Sit Still 0 0  6. Becoming Easily Annoyed or Irritable 0 0  7. Feeling Afraid As If Something Awful Might Happen 0 0  Total GAD-7 Score  1  Difficulty At Work, Home, or Getting  Along With Others?  Somewhat difficult    PHQ-9 Scores    11/04/2021    9:17 AM 10/28/2021    2:40 PM 07/22/2021   11:30 AM  PHQ9 SCORE ONLY  PHQ-9 Total Score 3 0 0    ---------------------------------------------------------------------------------------------------  Medications: Outpatient Medications Prior to Visit  Medication Sig   albuterol (VENTOLIN HFA) 108 (90 Base) MCG/ACT inhaler Inhale 2 puffs into the lungs every 6 (six) hours as needed for wheezing or shortness of breath.   ALPRAZolam (XANAX) 1 MG tablet TAKE ONE TABLET BY MOUTH 3 TIMES DAILY AS NEEDED FOR ANXIETY   citalopram (CELEXA) 10 MG tablet Take 5 mg by mouth daily.   FLUoxetine (PROZAC) 20 MG capsule TAKE 1 CAPSULE BY MOUTH EVERY DAY   hydrochlorothiazide (HYDRODIURIL) 12.5 MG tablet TAKE ONE TABLET BY MOUTH EVERY DAY   No facility-administered medications prior to visit.     Review of Systems  {Labs  Heme  Chem  Endocrine  Serology  Results Review (optional):23779}   Objective    BP 127/72 (BP Location: Right Arm, Patient Position: Sitting, Cuff Size: Normal)   Pulse 76   Temp 98.4 F (36.9 C) (Oral)   Resp 16   Ht 5' 7"  (1.702 m)   Wt 133 lb 1.6 oz (60.4 kg)   SpO2 100%   BMI 20.85 kg/m  {Show previous vital signs (optional):23777}  Physical Exam  ***  No results found for any visits on 02/02/22.  Assessment & Plan     ***  No follow-ups on file.      {provider attestation***:1}   Mikey Kirschner, PA-C  [facility] [phone] (phone) [phone] (fax)  [place]

## 2022-02-02 NOTE — Assessment & Plan Note (Signed)
Discussed multiple different medications she has tried in the past,  Increase Prozac to 40 mg daily. Continue xanax prn.  Again discussed psychiatry/therapy, pt declined.

## 2022-02-02 NOTE — Assessment & Plan Note (Signed)
Improved today. Continue HCTZ 12.5 mg

## 2022-02-02 NOTE — Progress Notes (Signed)
I,Tiffany J Bragg,acting as a Education administrator for [employer], PA-C.,have documented all relevant documentation on the behalf of Mikey Kirschner, PA-C,as directed by  Mikey Kirschner, PA-C while in the presence of Mikey Kirschner, PA-C.   Established patient visit   Patient: Molly Leonard   DOB: 01/01/1946   76 y.o. Female  MRN: [id number] Visit Date: 02/02/2022  Today's healthcare provider: Mikey Kirschner, PA-C   Chief Complaint  Patient presents with   Hypertension   Anxiety   Subjective    HPI  Hypertension, follow-up  BP Readings from Last 3 Encounters:  02/02/22 127/72  11/04/21 (!) 146/69  10/28/21 134/62   Wt Readings from Last 3 Encounters:  02/02/22 133 lb 1.6 oz (60.4 kg)  11/04/21 133 lb 3.2 oz (60.4 kg)  10/28/21 133 lb 4.8 oz (60.5 kg)     She was last seen for hypertension 3 months ago.  BP at that visit was 146/69. Management since that visit includes continue to monitor.  She reports excellent compliance with treatment. She is not having side effects.  She is following a Regular diet. She is not exercising. She does smoke.  Use of agents associated with hypertension: none.   Outside blood pressures are not checked. Symptoms: No chest pain No chest pressure  No palpitations No syncope  No dyspnea No orthopnea  No paroxysmal nocturnal dyspnea No lower extremity edema   Pertinent labs Lab Results  Component Value Date   CHOL 161 09/23/2021   HDL 35 (L) 09/23/2021   LDLCALC 88 09/23/2021   TRIG 228 (H) 09/23/2021   CHOLHDL 4.6 (H) 09/23/2021   Lab Results  Component Value Date   NA 141 09/23/2021   K 4.2 09/23/2021   CREATININE 1.11 (H) 09/23/2021   EGFR 52 (L) 09/23/2021   GLUCOSE 89 09/23/2021   TSH 1.890 12/20/2017     The 10-year ASCVD risk score (Arnett DK, et al., 2019) is: 28.3%  --------------------------------------------------------------------------------------------------- Anxiety, Follow-up  She was last seen for anxiety 3  months ago. Changes made at last visit include no changes in xanax use for now. Will stay on Prozac 20.   She reports excellent compliance with treatment. She reports excellent tolerance of treatment. She is not having side effects.   She feels her anxiety is moderate and Unchanged since last visit. Reports the prozac makes her fatigued  Symptoms: No chest pain No difficulty concentrating  No dizziness No fatigue  No feelings of losing control No insomnia  No irritable No palpitations  No panic attacks No racing thoughts  No shortness of breath No sweating  No tremors/shakes    GAD-7 Results    02/02/2022    9:45 AM 11/04/2021    9:18 AM 07/22/2021   11:35 AM  GAD-7 Generalized Anxiety Disorder Screening Tool  1. Feeling Nervous, Anxious, or on Edge 3 2 1   2. Not Being Able to Stop or Control Worrying 0 3 0  3. Worrying Too Much About Different Things 3 3 0  4. Trouble Relaxing 2  0  5. Being So Restless it's Hard To Sit Still 0 0 0  6. Becoming Easily Annoyed or Irritable 2 0 0  7. Feeling Afraid As If Something Awful Might Happen 3 0 0  Total GAD-7 Score 13  1  Difficulty At Work, Home, or Getting  Along With Others? Somewhat difficult  Somewhat difficult    PHQ-9 Scores    02/02/2022    9:29 AM 11/04/2021  9:17 AM 10/28/2021    2:40 PM  PHQ9 SCORE ONLY  PHQ-9 Total Score 0 3 0    ---------------------------------------------------------------------------------------------------   Medications: Outpatient Medications Prior to Visit  Medication Sig   albuterol (VENTOLIN HFA) 108 (90 Base) MCG/ACT inhaler Inhale 2 puffs into the lungs every 6 (six) hours as needed for wheezing or shortness of breath.   ALPRAZolam (XANAX) 1 MG tablet TAKE ONE TABLET BY MOUTH 3 TIMES DAILY AS NEEDED FOR ANXIETY   hydrochlorothiazide (HYDRODIURIL) 12.5 MG tablet TAKE ONE TABLET BY MOUTH EVERY DAY   [DISCONTINUED] citalopram (CELEXA) 10 MG tablet Take 5 mg by mouth daily.    [DISCONTINUED] FLUoxetine (PROZAC) 20 MG capsule TAKE 1 CAPSULE BY MOUTH EVERY DAY   No facility-administered medications prior to visit.    Review of Systems Review of Systems  Psychiatric/Behavioral:  The patient is nervous/anxious.      Objective    BP 127/72 (BP Location: Right Arm, Patient Position: Sitting, Cuff Size: Normal)   Pulse 76   Temp 98.4 F (36.9 C) (Oral)   Resp 16   Ht 5' 7"  (1.702 m)   Wt 133 lb 1.6 oz (60.4 kg)   SpO2 100%   BMI 20.85 kg/m   Physical Exam  Physical Exam Constitutional:      Appearance: Normal appearance.  Eyes:     Extraocular Movements: Extraocular movements intact.  Cardiovascular:     Rate and Rhythm: Normal rate.  Pulmonary:     Effort: Pulmonary effort is normal.  Neurological:     General: No focal deficit present.     Mental Status: She is oriented to person, place, and time.  Psychiatric:        Mood and Affect: Mood normal.        Behavior: Behavior normal.     No results found for any visits on 02/02/22.  Assessment & Plan     Problem List Items Addressed This Visit       Cardiovascular and Mediastinum   White coat syndrome with hypertension    Improved today. Continue HCTZ 12.5 mg         Other   Anxiety    Discussed multiple different medications she has tried in the past,  Increase Prozac to 40 mg daily. Continue xanax prn.  Again discussed psychiatry/therapy, pt declined.       Relevant Medications   FLUoxetine (PROZAC) 20 MG capsule   Depression, recurrent (HCC) - Primary    Along w/ GAD, GAD seems more prevalent today. Overall mood does seem improved, pt professes to less depressive symptoms and no mention of SI today Discussed multiple different medications she has tried in the past,  Increase Prozac to 40 mg daily. Continue xanax prn.       Relevant Medications   FLUoxetine (PROZAC) 20 MG capsule     Return in about 4 weeks (around 03/02/2022) for anxiety, depression.      I, Mikey Kirschner, PA-C have reviewed all documentation for this visit. The documentation on  @CurDate @  for the exam, diagnosis, procedures, and orders are all accurate and complete.  Mikey Kirschner, PA-C [facility] [address] [place], [place], [address] Office: [phone] Fax: [place]

## 2022-02-02 NOTE — Assessment & Plan Note (Addendum)
Along w/ GAD, GAD seems more prevalent today. Overall mood does seem improved, pt professes to less depressive symptoms and no mention of SI today Discussed multiple different medications she has tried in the past,  Increase Prozac to 40 mg daily. Continue xanax prn.

## 2022-02-24 ENCOUNTER — Other Ambulatory Visit: Payer: Self-pay | Admitting: Physician Assistant

## 2022-02-24 DIAGNOSIS — F419 Anxiety disorder, unspecified: Secondary | ICD-10-CM

## 2022-03-02 ENCOUNTER — Ambulatory Visit: Payer: Medicare Other | Admitting: Physician Assistant

## 2022-03-16 NOTE — Progress Notes (Signed)
I,Sha'taria Tyson,acting as a Neurosurgeon for [employer], PA-C.,have documented all relevant documentation on the behalf of Alfredia Ferguson, PA-C,as directed by  Alfredia Ferguson, PA-C while in the presence of Alfredia Ferguson, PA-C.   Established patient visit   Patient: Molly Leonard   DOB: 12-14-1945   76 y.o. Female  MRN: [id number] Visit Date: 03/18/2022  Today's healthcare provider: Alfredia Ferguson, PA-C   No chief complaint on file.  Subjective    HPI  Anxiety, Follow-up  She was last seen for anxiety 6 weeks ago. Changes made at last visit include increase Prozac to 40 mg daily. Continue xanax prn..   She reports {excellent/good/fair/poor:19665} compliance with treatment. She reports {good/fair/poor:18685} tolerance of treatment. She {is/is not:21021397} having side effects. {document side effects if present:1}  She feels her anxiety is {Desc; severity:60313} and {improved/worse/unchanged:3041574} since last visit.  Symptoms: {Yes/No:20286} chest pain {Yes/No:20286} difficulty concentrating  {Yes/No:20286} dizziness {Yes/No:20286} fatigue  {Yes/No:20286} feelings of losing control {Yes/No:20286} insomnia  {Yes/No:20286} irritable {Yes/No:20286} palpitations  {Yes/No:20286} panic attacks {Yes/No:20286} racing thoughts  {Yes/No:20286} shortness of breath {Yes/No:20286} sweating  {Yes/No:20286} tremors/shakes    GAD-7 Results    02/02/2022    9:45 AM 11/04/2021    9:18 AM 07/22/2021   11:35 AM  GAD-7 Generalized Anxiety Disorder Screening Tool  1. Feeling Nervous, Anxious, or on Edge 3 2 1   2. Not Being Able to Stop or Control Worrying 0 3 0  3. Worrying Too Much About Different Things 3 3 0  4. Trouble Relaxing 2  0  5. Being So Restless it's Hard To Sit Still 0 0 0  6. Becoming Easily Annoyed or Irritable 2 0 0  7. Feeling Afraid As If Something Awful Might Happen 3 0 0  Total GAD-7 Score 13  1  Difficulty At Work, Home, or Getting  Along With Others? Somewhat  difficult  Somewhat difficult   --------------------------------------------------------------------------------------------------- Depression, Follow-up  Current symptoms include: {Symptoms; depression:1002} She feels she is {improved/worse/unchanged:3041574} since last visit.     02/02/2022    9:29 AM 11/04/2021    9:17 AM 10/28/2021    2:40 PM  Depression screen PHQ 2/9  Decreased Interest 0 1 0  Down, Depressed, Hopeless 0 1 0  PHQ - 2 Score 0 2 0  Altered sleeping 0 0   Tired, decreased energy 0 1   Change in appetite 0 0   Feeling bad or failure about yourself  0 0   Trouble concentrating 0 0   Moving slowly or fidgety/restless 0 0   Suicidal thoughts 0 0   PHQ-9 Score 0 3   Difficult doing work/chores Not difficult at all      -----------------------------------------------------------------------------------------   Medications: Outpatient Medications Prior to Visit  Medication Sig   albuterol (VENTOLIN HFA) 108 (90 Base) MCG/ACT inhaler Inhale 2 puffs into the lungs every 6 (six) hours as needed for wheezing or shortness of breath.   ALPRAZolam (XANAX) 1 MG tablet TAKE ONE TABLET BY MOUTH 3 TIMES DAILY AS NEEDED FOR ANXIETY   FLUoxetine (PROZAC) 20 MG capsule TAKE 2 CAPSULES BY MOUTH EVERY DAY   hydrochlorothiazide (HYDRODIURIL) 12.5 MG tablet TAKE ONE TABLET BY MOUTH EVERY DAY   No facility-administered medications prior to visit.    Review of Systems  {Labs  Heme  Chem  Endocrine  Serology  Results Review (optional):23779}   Objective    There were no vitals taken for this visit. {Show previous vital signs (optional):23777}  Physical Exam  ***  No results found for any visits on 03/18/22.  Assessment & Plan     ***  No follow-ups on file.      {provider attestation***:1}   Alfredia Ferguson, PA-C  [facility] [phone] (phone) [phone] (fax)  [facility]

## 2022-03-18 ENCOUNTER — Ambulatory Visit (INDEPENDENT_AMBULATORY_CARE_PROVIDER_SITE_OTHER): Payer: Medicare Other | Admitting: Physician Assistant

## 2022-03-18 ENCOUNTER — Encounter: Payer: Self-pay | Admitting: Physician Assistant

## 2022-03-18 VITALS — BP 137/66 | HR 93 | Temp 98.4°F | Wt 133.5 lb

## 2022-03-18 DIAGNOSIS — D649 Anemia, unspecified: Secondary | ICD-10-CM | POA: Diagnosis not present

## 2022-03-18 DIAGNOSIS — I10 Essential (primary) hypertension: Secondary | ICD-10-CM

## 2022-03-18 DIAGNOSIS — F339 Major depressive disorder, recurrent, unspecified: Secondary | ICD-10-CM

## 2022-03-18 DIAGNOSIS — J069 Acute upper respiratory infection, unspecified: Secondary | ICD-10-CM

## 2022-03-18 MED ORDER — FLUTICASONE PROPIONATE 50 MCG/ACT NA SUSP: 2.0000 | Freq: Every day | NASAL | 6 refills | Status: DC

## 2022-03-18 NOTE — Assessment & Plan Note (Signed)
Historically, will recheck cbc today

## 2022-03-18 NOTE — Assessment & Plan Note (Addendum)
Pt did not increase Prozac to 40 mg, nervous about dose. Continue 20 mg. Advised to switch to taking at night as it makes her drowsy. Otherwise stable. Pt declines phq9, denies SI today, continues to decline psych referral or therapy. Continue xanax prn.  F/u 4 mo

## 2022-03-18 NOTE — Assessment & Plan Note (Addendum)
Viral illness, decision not to test for covid as pt is practicing precautions regardless; mild symptoms Increase fluid intake, tylneol, rx new flonase

## 2022-03-18 NOTE — Assessment & Plan Note (Signed)
Well controlled today on current medications Will recheck cmp today F/u 6 mo

## 2022-03-19 ENCOUNTER — Other Ambulatory Visit: Payer: Self-pay | Admitting: Physician Assistant

## 2022-03-19 DIAGNOSIS — N179 Acute kidney failure, unspecified: Secondary | ICD-10-CM

## 2022-03-19 DIAGNOSIS — D649 Anemia, unspecified: Secondary | ICD-10-CM

## 2022-03-19 LAB — COMPREHENSIVE METABOLIC PANEL
ALT: 14 IU/L (ref 0–32)
AST: 24 IU/L (ref 0–40)
Albumin/Globulin Ratio: 1.6 (ref 1.2–2.2)
Albumin: 4.4 g/dL (ref 3.7–4.7)
Alkaline Phosphatase: 113 IU/L (ref 44–121)
BUN/Creatinine Ratio: 20 (ref 12–28)
BUN: 26 mg/dL (ref 8–27)
Bilirubin Total: <0.2 mg/dL (ref 0.0–1.2)
CO2: 20 mmol/L (ref 20–29)
Calcium: 9.5 mg/dL (ref 8.7–10.3)
Chloride: 102 mmol/L (ref 96–106)
Creatinine, Ser: 1.29 mg/dL — ABNORMAL HIGH (ref 0.57–1.00)
Globulin, Total: 2.8 g/dL (ref 1.5–4.5)
Glucose: 109 mg/dL — ABNORMAL HIGH (ref 70–99)
Potassium: 3.9 mmol/L (ref 3.5–5.2)
Sodium: 139 mmol/L (ref 134–144)
Total Protein: 7.2 g/dL (ref 6.0–8.5)
eGFR: 43 mL/min/{1.73_m2} — ABNORMAL LOW (ref >59)

## 2022-03-19 LAB — CBC WITH DIFFERENTIAL/PLATELET
Basophils Absolute: 0 10*3/uL (ref 0.0–0.2)
Basos: 0 %
EOS (ABSOLUTE): 0 10*3/uL (ref 0.0–0.4)
Eos: 0 %
Hematocrit: 33.3 % — ABNORMAL LOW (ref 34.0–46.6)
Hemoglobin: 10.6 g/dL — ABNORMAL LOW (ref 11.1–15.9)
Immature Grans (Abs): 0 10*3/uL (ref 0.0–0.1)
Immature Granulocytes: 0 %
Lymphocytes Absolute: 0.9 10*3/uL (ref 0.7–3.1)
Lymphs: 20 %
MCH: 32.2 pg (ref 26.6–33.0)
MCHC: 31.8 g/dL (ref 31.5–35.7)
MCV: 101 fL — ABNORMAL HIGH (ref 79–97)
Monocytes Absolute: 0.6 10*3/uL (ref 0.1–0.9)
Monocytes: 13 %
Neutrophils Absolute: 3.1 10*3/uL (ref 1.4–7.0)
Neutrophils: 67 %
Platelets: 317 10*3/uL (ref 150–450)
RBC: 3.29 x10E6/uL — ABNORMAL LOW (ref 3.77–5.28)
RDW: 12 % (ref 11.7–15.4)
WBC: 4.7 10*3/uL (ref 3.4–10.8)

## 2022-04-02 ENCOUNTER — Other Ambulatory Visit: Payer: Self-pay | Admitting: Physician Assistant

## 2022-04-03 ENCOUNTER — Other Ambulatory Visit: Payer: Self-pay | Admitting: Physician Assistant

## 2022-04-03 DIAGNOSIS — F419 Anxiety disorder, unspecified: Secondary | ICD-10-CM

## 2022-05-05 ENCOUNTER — Other Ambulatory Visit: Payer: Self-pay | Admitting: Physician Assistant

## 2022-05-05 DIAGNOSIS — F419 Anxiety disorder, unspecified: Secondary | ICD-10-CM

## 2022-05-05 DIAGNOSIS — F339 Major depressive disorder, recurrent, unspecified: Secondary | ICD-10-CM

## 2022-07-13 ENCOUNTER — Other Ambulatory Visit: Payer: Self-pay | Admitting: Physician Assistant

## 2022-07-13 DIAGNOSIS — F419 Anxiety disorder, unspecified: Secondary | ICD-10-CM

## 2022-07-20 ENCOUNTER — Encounter: Payer: Self-pay | Admitting: Physician Assistant

## 2022-07-20 ENCOUNTER — Ambulatory Visit (INDEPENDENT_AMBULATORY_CARE_PROVIDER_SITE_OTHER): Payer: Medicare Other | Admitting: Physician Assistant

## 2022-07-20 VITALS — BP 130/57 | HR 87 | Wt 135.0 lb

## 2022-07-20 DIAGNOSIS — I10 Essential (primary) hypertension: Secondary | ICD-10-CM

## 2022-07-20 DIAGNOSIS — D649 Anemia, unspecified: Secondary | ICD-10-CM | POA: Diagnosis not present

## 2022-07-20 DIAGNOSIS — F339 Major depressive disorder, recurrent, unspecified: Secondary | ICD-10-CM | POA: Diagnosis not present

## 2022-07-20 DIAGNOSIS — N179 Acute kidney failure, unspecified: Secondary | ICD-10-CM | POA: Diagnosis not present

## 2022-07-20 DIAGNOSIS — Z23 Encounter for immunization: Secondary | ICD-10-CM | POA: Diagnosis not present

## 2022-07-20 MED ORDER — FLUOXETINE HCL 20 MG PO CAPS: 40.0000 mg | ORAL_CAPSULE | Freq: Every day | ORAL | 1 refills | Status: DC

## 2022-07-20 NOTE — Progress Notes (Signed)
I,Sha'taria Tyson,acting as a Neurosurgeon for [employer], PA-C.,have documented all relevant documentation on the behalf of Molly Ferguson, PA-C,as directed by  Molly Ferguson, PA-C while in the presence of Molly Ferguson, PA-C.   Established patient visit   Patient: Molly Leonard   DOB: Jul 20, 1946   76 y.o. Female  MRN: [id number] Visit Date: 07/20/2022  Today's healthcare provider: Alfredia Ferguson, PA-C   Cc. Depression f/u  Subjective    HPI  Depression, Follow-up  She  was last seen for this 4 months ago. Pt reports she finally tried taking two of her Prozac 20 mg. Reports she takes one in the Am and one at lunch time, and this works best for her. Reports improvement in mood, energy.   She describes one scenario w/ her granddaughter where she allowed herself to sit with the anxiety instead of letting it run her decisions.  Admits to passive SI, but far less than previously.  She reports excellent compliance with treatment. She is not having side effects.   She reports excellent tolerance of treatment. Current symptoms include: depressed mood and fatigue She feels she is Improved since last visit.   She agrees to a PHQ 9, see below. Typically she refused, or admitted that she filled out with all 0s to avoid stigma.     07/20/2022   10:45 AM 02/02/2022    9:29 AM 11/04/2021    9:17 AM  Depression screen PHQ 2/9  Decreased Interest 2 0 1  Down, Depressed, Hopeless 2 0 1  PHQ - 2 Score 4 0 2  Altered sleeping 3 0 0  Tired, decreased energy 2 0 1  Change in appetite 2 0 0  Feeling bad or failure about yourself  1 0 0  Trouble concentrating 1 0 0  Moving slowly or fidgety/restless 3 0 0  Suicidal thoughts 2 0 0  PHQ-9 Score 18 0 3  Difficult doing work/chores  Not difficult at all     -----------------------------------------------------------------------------------------  Follow up for White coat syndrome with hypertension:  The patient was last seen for this 4  months ago. Changes made at last visit include; Well controlled today on current medications.  She reports excellent compliance with treatment. She feels that condition is Improved. She is not having side effects.   -----------------------------------------------------------------------------------------  Medications: Outpatient Medications Prior to Visit  Medication Sig   albuterol (VENTOLIN HFA) 108 (90 Base) MCG/ACT inhaler Inhale 2 puffs into the lungs every 6 (six) hours as needed for wheezing or shortness of breath.   ALPRAZolam (XANAX) 1 MG tablet TAKE ONE TABLET BY MOUTH 3 TIMES DAILY AS NEEDED FOR ANXIETY   fluticasone (FLONASE) 50 MCG/ACT nasal spray Place 2 sprays into both nostrils daily.   hydrochlorothiazide (HYDRODIURIL) 12.5 MG tablet TAKE ONE TABLET BY MOUTH EVERY DAY   [DISCONTINUED] FLUoxetine (PROZAC) 20 MG capsule TAKE 1 CAPSULES BY MOUTH EVERY DAY (Patient taking differently: TAKE 2 CAPSULES BY MOUTH EVERY DAY)   No facility-administered medications prior to visit.    Review of Systems  Constitutional:  Negative for appetite change, chills, fatigue and fever.  Respiratory:  Negative for chest tightness and shortness of breath.   Cardiovascular:  Negative for chest pain and palpitations.  Gastrointestinal:  Negative for abdominal pain, nausea and vomiting.  Neurological:  Negative for dizziness and weakness.     Objective    BP (!) 130/57 (BP Location: Left Arm, Patient Position: Sitting, Cuff Size: Normal)   Pulse 87  Wt 135 lb (61.2 kg)   SpO2 100%   BMI 21.14 kg/m  BP Readings from Last 3 Encounters:  07/20/22 (!) 130/57  03/18/22 137/66  02/02/22 127/72      Physical Exam Vitals reviewed.  Constitutional:      Appearance: She is not ill-appearing.  HENT:     Head: Normocephalic.  Eyes:     Conjunctiva/sclera: Conjunctivae normal.  Cardiovascular:     Rate and Rhythm: Normal rate.  Pulmonary:     Effort: Pulmonary effort is normal. No  respiratory distress.  Neurological:     General: No focal deficit present.     Mental Status: She is alert and oriented to person, place, and time.  Psychiatric:        Mood and Affect: Mood normal.        Behavior: Behavior normal.      No results found for any visits on 07/20/22.  Assessment & Plan     Problem List Items Addressed This Visit       Cardiovascular and Mediastinum   White coat syndrome with hypertension    Overall within range.  Continue medications Ordered cmp        Other   Depression, recurrent ([place]) - Primary    Pt has increased prozac to 40 mg . Advised she can even taken up to 60 mg a day. Still taking xanax prn anxiety/depression.  Pt was truthful on phq9 today, typically answers as 0 or refuses. Overall patient is grossly improved from her baseline. Discussed SI, have been passive, no plan. She has several protective factors-- a dependent, husband, support network. And improved from months ago.       Relevant Medications   FLUoxetine (PROZAC) 20 MG capsule   Other Visit Diagnoses     Flu vaccine need       Relevant Orders   Flu Vaccine QUAD High Dose(Fluad) (Completed)        Return in about 4 months (around 11/18/2022) for AVW.      I, Molly Kirschner, PA-C have reviewed all documentation for this visit. The documentation on  07/20/2022  for the exam, diagnosis, procedures, and orders are all accurate and complete.  Molly Kirschner, PA-C [facility] [address] [place], [place], [address] Office: [phone] Fax: [place]

## 2022-07-20 NOTE — Assessment & Plan Note (Addendum)
Pt has increased prozac to 40 mg . Advised she can even taken up to 60 mg a day. Still taking xanax prn anxiety/depression.  Pt was truthful on phq9 today, typically answers as 0 or refuses. Overall patient is grossly improved from her baseline. Discussed SI, have been passive, no plan. She has several protective factors-- a dependent, husband, support network. And improved from months ago.

## 2022-07-20 NOTE — Assessment & Plan Note (Signed)
Overall within range.  Continue medications Ordered cmp

## 2022-07-21 ENCOUNTER — Other Ambulatory Visit: Payer: Self-pay | Admitting: Physician Assistant

## 2022-07-21 DIAGNOSIS — E538 Deficiency of other specified B group vitamins: Secondary | ICD-10-CM

## 2022-07-21 DIAGNOSIS — N1832 Chronic kidney disease, stage 3b: Secondary | ICD-10-CM

## 2022-07-21 LAB — CBC WITH DIFFERENTIAL/PLATELET
Basophils Absolute: 0 10*3/uL (ref 0.0–0.2)
Basos: 0 %
EOS (ABSOLUTE): 0.1 10*3/uL (ref 0.0–0.4)
Eos: 1 %
Hematocrit: 32.1 % — ABNORMAL LOW (ref 34.0–46.6)
Hemoglobin: 10.8 g/dL — ABNORMAL LOW (ref 11.1–15.9)
Immature Grans (Abs): 0 10*3/uL (ref 0.0–0.1)
Immature Granulocytes: 0 %
Lymphocytes Absolute: 1.8 10*3/uL (ref 0.7–3.1)
Lymphs: 22 %
MCH: 34.1 pg — ABNORMAL HIGH (ref 26.6–33.0)
MCHC: 33.6 g/dL (ref 31.5–35.7)
MCV: 101 fL — ABNORMAL HIGH (ref 79–97)
Monocytes Absolute: 0.6 10*3/uL (ref 0.1–0.9)
Monocytes: 7 %
Neutrophils Absolute: 5.9 10*3/uL (ref 1.4–7.0)
Neutrophils: 70 %
Platelets: 397 10*3/uL (ref 150–450)
RBC: 3.17 x10E6/uL — ABNORMAL LOW (ref 3.77–5.28)
RDW: 11.9 % (ref 11.7–15.4)
WBC: 8.4 10*3/uL (ref 3.4–10.8)

## 2022-07-21 LAB — BASIC METABOLIC PANEL
BUN/Creatinine Ratio: 24 (ref 12–28)
BUN: 32 mg/dL — ABNORMAL HIGH (ref 8–27)
CO2: 23 mmol/L (ref 20–29)
Calcium: 10 mg/dL (ref 8.7–10.3)
Chloride: 102 mmol/L (ref 96–106)
Creatinine, Ser: 1.34 mg/dL — ABNORMAL HIGH (ref 0.57–1.00)
Glucose: 79 mg/dL (ref 70–99)
Potassium: 4.4 mmol/L (ref 3.5–5.2)
Sodium: 142 mmol/L (ref 134–144)
eGFR: 41 mL/min/{1.73_m2} — ABNORMAL LOW (ref >59)

## 2022-07-21 LAB — VITAMIN B12: Vitamin B-12: 276 pg/mL (ref 232–1245)

## 2022-07-21 LAB — IRON,TIBC AND FERRITIN PANEL
Ferritin: 248 ng/mL — ABNORMAL HIGH (ref 15–150)
Iron Saturation: 21 % (ref 15–55)
Iron: 67 ug/dL (ref 27–139)
Total Iron Binding Capacity: 325 ug/dL (ref 250–450)
UIBC: 258 ug/dL (ref 118–369)

## 2022-07-21 LAB — FOLATE: Folate: 2.7 ng/mL — ABNORMAL LOW (ref >3.0)

## 2022-07-21 MED ORDER — FOLIC ACID 1 MG PO TABS: 1.0000 mg | ORAL_TABLET | Freq: Every day | ORAL | 0 refills | Status: DC

## 2022-08-05 ENCOUNTER — Other Ambulatory Visit: Payer: Self-pay | Admitting: Physician Assistant

## 2022-08-18 ENCOUNTER — Other Ambulatory Visit: Payer: Self-pay | Admitting: Physician Assistant

## 2022-08-18 DIAGNOSIS — F419 Anxiety disorder, unspecified: Secondary | ICD-10-CM

## 2022-09-02 ENCOUNTER — Other Ambulatory Visit: Payer: Self-pay | Admitting: Physician Assistant

## 2022-09-02 DIAGNOSIS — E538 Deficiency of other specified B group vitamins: Secondary | ICD-10-CM

## 2022-09-28 ENCOUNTER — Other Ambulatory Visit: Payer: Self-pay | Admitting: Nephrology

## 2022-09-28 DIAGNOSIS — N1832 Chronic kidney disease, stage 3b: Secondary | ICD-10-CM | POA: Diagnosis not present

## 2022-09-28 DIAGNOSIS — I10 Essential (primary) hypertension: Secondary | ICD-10-CM | POA: Diagnosis not present

## 2022-10-06 ENCOUNTER — Ambulatory Visit
Admission: RE | Admit: 2022-10-06 | Discharge: 2022-10-06 | Disposition: A | Discharge status: Home / Self Care | Payer: Medicare Other | Source: Ambulatory Visit | Attending: Nephrology | Admitting: Nephrology

## 2022-10-06 DIAGNOSIS — N186 End stage renal disease: Secondary | ICD-10-CM | POA: Diagnosis not present

## 2022-10-06 DIAGNOSIS — N1832 Chronic kidney disease, stage 3b: Secondary | ICD-10-CM | POA: Insufficient documentation

## 2022-10-28 ENCOUNTER — Other Ambulatory Visit: Payer: Self-pay | Admitting: Physician Assistant

## 2022-10-28 DIAGNOSIS — F419 Anxiety disorder, unspecified: Secondary | ICD-10-CM

## 2022-11-03 ENCOUNTER — Other Ambulatory Visit: Payer: Self-pay | Admitting: Physician Assistant

## 2022-11-03 DIAGNOSIS — F339 Major depressive disorder, recurrent, unspecified: Secondary | ICD-10-CM

## 2022-11-05 DIAGNOSIS — J069 Acute upper respiratory infection, unspecified: Secondary | ICD-10-CM | POA: Diagnosis not present

## 2022-11-05 DIAGNOSIS — R519 Headache, unspecified: Secondary | ICD-10-CM | POA: Diagnosis not present

## 2022-11-09 DIAGNOSIS — D631 Anemia in chronic kidney disease: Secondary | ICD-10-CM | POA: Diagnosis not present

## 2022-11-09 DIAGNOSIS — R809 Proteinuria, unspecified: Secondary | ICD-10-CM | POA: Diagnosis not present

## 2022-11-09 DIAGNOSIS — N1832 Chronic kidney disease, stage 3b: Secondary | ICD-10-CM | POA: Diagnosis not present

## 2022-11-09 DIAGNOSIS — I10 Essential (primary) hypertension: Secondary | ICD-10-CM | POA: Diagnosis not present

## 2022-11-18 ENCOUNTER — Encounter: Payer: Medicare Other | Admitting: Physician Assistant

## 2022-12-09 ENCOUNTER — Other Ambulatory Visit: Payer: Self-pay | Admitting: Physician Assistant

## 2022-12-09 DIAGNOSIS — F419 Anxiety disorder, unspecified: Secondary | ICD-10-CM

## 2023-02-02 ENCOUNTER — Other Ambulatory Visit: Payer: Self-pay | Admitting: Physician Assistant

## 2023-02-02 DIAGNOSIS — E538 Deficiency of other specified B group vitamins: Secondary | ICD-10-CM

## 2023-02-09 ENCOUNTER — Inpatient Hospital Stay
Admission: EM | Admit: 2023-02-09 | Discharge: 2023-02-18 | DRG: 522 | Disposition: A | Discharge status: Skilled Nursing Facility | Payer: Medicare Other | Attending: Internal Medicine | Admitting: Internal Medicine

## 2023-02-09 ENCOUNTER — Inpatient Hospital Stay: Payer: Medicare Other

## 2023-02-09 ENCOUNTER — Encounter
Admission: EM | Disposition: A | Discharge status: Skilled Nursing Facility | Payer: Self-pay | Source: Home / Self Care | Attending: Internal Medicine

## 2023-02-09 ENCOUNTER — Encounter: Payer: Self-pay | Admitting: Emergency Medicine

## 2023-02-09 ENCOUNTER — Emergency Department: Payer: Medicare Other

## 2023-02-09 ENCOUNTER — Other Ambulatory Visit: Payer: Self-pay

## 2023-02-09 ENCOUNTER — Inpatient Hospital Stay: Payer: Medicare Other | Admitting: Anesthesiology

## 2023-02-09 DIAGNOSIS — R110 Nausea: Secondary | ICD-10-CM | POA: Diagnosis not present

## 2023-02-09 DIAGNOSIS — M6259 Muscle wasting and atrophy, not elsewhere classified, multiple sites: Secondary | ICD-10-CM | POA: Diagnosis not present

## 2023-02-09 DIAGNOSIS — W19XXXA Unspecified fall, initial encounter: Secondary | ICD-10-CM

## 2023-02-09 DIAGNOSIS — S72001A Fracture of unspecified part of neck of right femur, initial encounter for closed fracture: Principal | ICD-10-CM

## 2023-02-09 DIAGNOSIS — J45909 Unspecified asthma, uncomplicated: Secondary | ICD-10-CM

## 2023-02-09 DIAGNOSIS — F419 Anxiety disorder, unspecified: Secondary | ICD-10-CM | POA: Diagnosis present

## 2023-02-09 DIAGNOSIS — S72009A Fracture of unspecified part of neck of unspecified femur, initial encounter for closed fracture: Secondary | ICD-10-CM | POA: Diagnosis present

## 2023-02-09 DIAGNOSIS — F32A Depression, unspecified: Secondary | ICD-10-CM | POA: Diagnosis not present

## 2023-02-09 DIAGNOSIS — F17210 Nicotine dependence, cigarettes, uncomplicated: Secondary | ICD-10-CM | POA: Diagnosis not present

## 2023-02-09 DIAGNOSIS — Z743 Need for continuous supervision: Secondary | ICD-10-CM | POA: Diagnosis not present

## 2023-02-09 DIAGNOSIS — R262 Difficulty in walking, not elsewhere classified: Secondary | ICD-10-CM | POA: Diagnosis not present

## 2023-02-09 DIAGNOSIS — Y92009 Unspecified place in unspecified non-institutional (private) residence as the place of occurrence of the external cause: Secondary | ICD-10-CM | POA: Diagnosis not present

## 2023-02-09 DIAGNOSIS — R410 Disorientation, unspecified: Secondary | ICD-10-CM | POA: Diagnosis not present

## 2023-02-09 DIAGNOSIS — S72011A Unspecified intracapsular fracture of right femur, initial encounter for closed fracture: Secondary | ICD-10-CM | POA: Diagnosis not present

## 2023-02-09 DIAGNOSIS — B9620 Unspecified Escherichia coli [E. coli] as the cause of diseases classified elsewhere: Secondary | ICD-10-CM | POA: Diagnosis present

## 2023-02-09 DIAGNOSIS — Z8249 Family history of ischemic heart disease and other diseases of the circulatory system: Secondary | ICD-10-CM

## 2023-02-09 DIAGNOSIS — N390 Urinary tract infection, site not specified: Secondary | ICD-10-CM | POA: Diagnosis not present

## 2023-02-09 DIAGNOSIS — J4489 Other specified chronic obstructive pulmonary disease: Secondary | ICD-10-CM | POA: Diagnosis not present

## 2023-02-09 DIAGNOSIS — Z043 Encounter for examination and observation following other accident: Secondary | ICD-10-CM | POA: Diagnosis not present

## 2023-02-09 DIAGNOSIS — K2100 Gastro-esophageal reflux disease with esophagitis, without bleeding: Secondary | ICD-10-CM | POA: Diagnosis not present

## 2023-02-09 DIAGNOSIS — I10 Essential (primary) hypertension: Secondary | ICD-10-CM | POA: Diagnosis not present

## 2023-02-09 DIAGNOSIS — Z833 Family history of diabetes mellitus: Secondary | ICD-10-CM | POA: Diagnosis not present

## 2023-02-09 DIAGNOSIS — Z87891 Personal history of nicotine dependence: Secondary | ICD-10-CM | POA: Diagnosis not present

## 2023-02-09 DIAGNOSIS — W010XXA Fall on same level from slipping, tripping and stumbling without subsequent striking against object, initial encounter: Secondary | ICD-10-CM | POA: Diagnosis present

## 2023-02-09 DIAGNOSIS — J449 Chronic obstructive pulmonary disease, unspecified: Secondary | ICD-10-CM | POA: Diagnosis present

## 2023-02-09 DIAGNOSIS — M79604 Pain in right leg: Secondary | ICD-10-CM | POA: Diagnosis not present

## 2023-02-09 DIAGNOSIS — R000 Tachycardia, unspecified: Secondary | ICD-10-CM | POA: Diagnosis not present

## 2023-02-09 DIAGNOSIS — D62 Acute posthemorrhagic anemia: Secondary | ICD-10-CM | POA: Diagnosis not present

## 2023-02-09 DIAGNOSIS — S72001D Fracture of unspecified part of neck of right femur, subsequent encounter for closed fracture with routine healing: Secondary | ICD-10-CM | POA: Diagnosis not present

## 2023-02-09 DIAGNOSIS — Z7401 Bed confinement status: Secondary | ICD-10-CM | POA: Diagnosis not present

## 2023-02-09 DIAGNOSIS — Z820 Family history of epilepsy and other diseases of the nervous system: Secondary | ICD-10-CM | POA: Diagnosis not present

## 2023-02-09 DIAGNOSIS — Z90710 Acquired absence of both cervix and uterus: Secondary | ICD-10-CM

## 2023-02-09 DIAGNOSIS — F339 Major depressive disorder, recurrent, unspecified: Secondary | ICD-10-CM | POA: Diagnosis present

## 2023-02-09 DIAGNOSIS — S0990XA Unspecified injury of head, initial encounter: Secondary | ICD-10-CM | POA: Diagnosis not present

## 2023-02-09 DIAGNOSIS — Z96641 Presence of right artificial hip joint: Secondary | ICD-10-CM | POA: Diagnosis not present

## 2023-02-09 DIAGNOSIS — R279 Unspecified lack of coordination: Secondary | ICD-10-CM | POA: Diagnosis not present

## 2023-02-09 DIAGNOSIS — Z471 Aftercare following joint replacement surgery: Secondary | ICD-10-CM | POA: Diagnosis not present

## 2023-02-09 DIAGNOSIS — R2681 Unsteadiness on feet: Secondary | ICD-10-CM | POA: Diagnosis not present

## 2023-02-09 DIAGNOSIS — G4489 Other headache syndrome: Secondary | ICD-10-CM | POA: Diagnosis not present

## 2023-02-09 DIAGNOSIS — R6889 Other general symptoms and signs: Secondary | ICD-10-CM | POA: Diagnosis not present

## 2023-02-09 DIAGNOSIS — Z79899 Other long term (current) drug therapy: Secondary | ICD-10-CM

## 2023-02-09 DIAGNOSIS — K5904 Chronic idiopathic constipation: Secondary | ICD-10-CM | POA: Diagnosis not present

## 2023-02-09 DIAGNOSIS — M25559 Pain in unspecified hip: Secondary | ICD-10-CM | POA: Diagnosis not present

## 2023-02-09 DIAGNOSIS — J418 Mixed simple and mucopurulent chronic bronchitis: Secondary | ICD-10-CM | POA: Diagnosis not present

## 2023-02-09 DIAGNOSIS — D649 Anemia, unspecified: Secondary | ICD-10-CM | POA: Diagnosis not present

## 2023-02-09 DIAGNOSIS — G894 Chronic pain syndrome: Secondary | ICD-10-CM | POA: Diagnosis not present

## 2023-02-09 DIAGNOSIS — Z741 Need for assistance with personal care: Secondary | ICD-10-CM | POA: Diagnosis not present

## 2023-02-09 HISTORY — PX: HIP ARTHROPLASTY: SHX981

## 2023-02-09 LAB — CBC WITH DIFFERENTIAL/PLATELET
Abs Immature Granulocytes: 0.03 10*3/uL (ref 0.00–0.07)
Basophils Absolute: 0 10*3/uL (ref 0.0–0.1)
Basophils Relative: 0 %
Eosinophils Absolute: 0.1 10*3/uL (ref 0.0–0.5)
Eosinophils Relative: 1 %
HCT: 32.5 % — ABNORMAL LOW (ref 36.0–46.0)
Hemoglobin: 10.3 g/dL — ABNORMAL LOW (ref 12.0–15.0)
Immature Granulocytes: 0 %
Lymphocytes Relative: 15 %
Lymphs Abs: 1.7 10*3/uL (ref 0.7–4.0)
MCH: 32.3 pg (ref 26.0–34.0)
MCHC: 31.7 g/dL (ref 30.0–36.0)
MCV: 101.9 fL — ABNORMAL HIGH (ref 80.0–100.0)
Monocytes Absolute: 0.6 10*3/uL (ref 0.1–1.0)
Monocytes Relative: 6 %
Neutro Abs: 8.4 10*3/uL — ABNORMAL HIGH (ref 1.7–7.7)
Neutrophils Relative %: 78 %
Platelets: 465 10*3/uL — ABNORMAL HIGH (ref 150–400)
RBC: 3.19 MIL/uL — ABNORMAL LOW (ref 3.87–5.11)
RDW: 12.5 % (ref 11.5–15.5)
WBC: 10.8 10*3/uL — ABNORMAL HIGH (ref 4.0–10.5)
nRBC: 0 % (ref 0.0–0.2)

## 2023-02-09 LAB — COMPREHENSIVE METABOLIC PANEL
ALT: 18 U/L (ref 0–44)
AST: 23 U/L (ref 15–41)
Albumin: 3.8 g/dL (ref 3.5–5.0)
Alkaline Phosphatase: 93 U/L (ref 38–126)
Anion gap: 10 (ref 5–15)
BUN: 32 mg/dL — ABNORMAL HIGH (ref 8–23)
CO2: 24 mmol/L (ref 22–32)
Calcium: 9.1 mg/dL (ref 8.9–10.3)
Chloride: 103 mmol/L (ref 98–111)
Creatinine, Ser: 1.3 mg/dL — ABNORMAL HIGH (ref 0.44–1.00)
GFR, Estimated: 43 mL/min — ABNORMAL LOW (ref >60)
Glucose, Bld: 151 mg/dL — ABNORMAL HIGH (ref 70–99)
Potassium: 3.2 mmol/L — ABNORMAL LOW (ref 3.5–5.1)
Sodium: 137 mmol/L (ref 135–145)
Total Bilirubin: 0.6 mg/dL (ref 0.3–1.2)
Total Protein: 7.7 g/dL (ref 6.5–8.1)

## 2023-02-09 LAB — TYPE AND SCREEN
ABO/RH(D): O POS
Antibody Screen: NEGATIVE

## 2023-02-09 LAB — ABO/RH: ABO/RH(D): O POS

## 2023-02-09 SURGERY — HEMIARTHROPLASTY, HIP, DIRECT ANTERIOR APPROACH, FOR FRACTURE
Anesthesia: Spinal | Site: Hip | Laterality: Right

## 2023-02-09 MED ORDER — ENOXAPARIN SODIUM 40 MG/0.4ML IJ SOSY: 40.0000 mg | PREFILLED_SYRINGE | INTRAMUSCULAR | Status: DC

## 2023-02-09 MED ORDER — TRANEXAMIC ACID-NACL 1000-0.7 MG/100ML-% IV SOLN
INTRAVENOUS | Status: DC | PRN
  Administered 2023-02-09 (×2): 1000 mg via INTRAVENOUS

## 2023-02-09 MED ORDER — OXYCODONE HCL 5 MG/5ML PO SOLN: 5.0000 mg | Freq: Once | ORAL | Status: DC | PRN

## 2023-02-09 MED ORDER — BUPIVACAINE HCL (PF) 0.5 % IJ SOLN
INTRAMUSCULAR | Status: AC
  Filled 2023-02-09: qty 10

## 2023-02-09 MED ORDER — ONDANSETRON HCL 4 MG/2ML IJ SOLN
4.0000 mg | Freq: Four times a day (QID) | INTRAMUSCULAR | Status: DC | PRN
  Administered 2023-02-09 – 2023-02-16 (×2): 4 mg via INTRAVENOUS
  Filled 2023-02-09 (×2): qty 2

## 2023-02-09 MED ORDER — ONDANSETRON HCL 4 MG/2ML IJ SOLN: 4.0000 mg | Freq: Once | INTRAMUSCULAR | Status: DC | PRN

## 2023-02-09 MED ORDER — CEFAZOLIN SODIUM-DEXTROSE 2-4 GM/100ML-% IV SOLN
2.0000 g | Freq: Three times a day (TID) | INTRAVENOUS | Status: AC
  Administered 2023-02-09 – 2023-02-10 (×2): 2 g via INTRAVENOUS
  Filled 2023-02-09 (×2): qty 100

## 2023-02-09 MED ORDER — ACETAMINOPHEN 10 MG/ML IV SOLN
INTRAVENOUS | Status: AC
  Filled 2023-02-09: qty 100

## 2023-02-09 MED ORDER — SURGIPHOR WOUND IRRIGATION SYSTEM - OPTIME
TOPICAL | Status: DC | PRN
  Administered 2023-02-09: 450 mL via TOPICAL

## 2023-02-09 MED ORDER — MENTHOL 3 MG MT LOZG: 1.0000 | LOZENGE | OROMUCOSAL | Status: DC | PRN

## 2023-02-09 MED ORDER — CEFAZOLIN SODIUM 1 G IJ SOLR
INTRAMUSCULAR | Status: AC
  Filled 2023-02-09: qty 20

## 2023-02-09 MED ORDER — FENTANYL CITRATE (PF) 100 MCG/2ML IJ SOLN
INTRAMUSCULAR | Status: DC | PRN
  Administered 2023-02-09 (×2): 50 ug via INTRAVENOUS

## 2023-02-09 MED ORDER — BUPIVACAINE HCL (PF) 0.5 % IJ SOLN
INTRAMUSCULAR | Status: DC | PRN
  Administered 2023-02-09: 2.5 mL

## 2023-02-09 MED ORDER — ENOXAPARIN SODIUM 40 MG/0.4ML IJ SOSY
40.0000 mg | PREFILLED_SYRINGE | INTRAMUSCULAR | Status: DC
  Administered 2023-02-10 – 2023-02-18 (×9): 40 mg via SUBCUTANEOUS
  Filled 2023-02-09 (×9): qty 0.4

## 2023-02-09 MED ORDER — FLUOXETINE HCL 20 MG PO CAPS
40.0000 mg | ORAL_CAPSULE | Freq: Every day | ORAL | Status: DC
  Administered 2023-02-09 – 2023-02-18 (×10): 40 mg via ORAL
  Filled 2023-02-09 (×10): qty 2

## 2023-02-09 MED ORDER — FENTANYL CITRATE (PF) 100 MCG/2ML IJ SOLN: 25.0000 ug | INTRAMUSCULAR | Status: DC | PRN

## 2023-02-09 MED ORDER — HYDROCODONE-ACETAMINOPHEN 7.5-325 MG PO TABS
1.0000 | ORAL_TABLET | ORAL | Status: DC | PRN
  Administered 2023-02-09: 1 via ORAL
  Filled 2023-02-09 (×2): qty 2
  Filled 2023-02-09: qty 1

## 2023-02-09 MED ORDER — SODIUM CHLORIDE 0.9 % IR SOLN
Status: DC | PRN
  Administered 2023-02-09: 3000 mL

## 2023-02-09 MED ORDER — ONDANSETRON HCL 4 MG PO TABS
4.0000 mg | ORAL_TABLET | Freq: Four times a day (QID) | ORAL | Status: DC | PRN
  Administered 2023-02-13: 4 mg via ORAL
  Filled 2023-02-09: qty 1

## 2023-02-09 MED ORDER — EPINEPHRINE PF 1 MG/ML IJ SOLN
INTRAMUSCULAR | Status: AC
  Filled 2023-02-09: qty 1

## 2023-02-09 MED ORDER — SODIUM CHLORIDE 0.9 % IV SOLN
12.5000 mg | Freq: Once | INTRAVENOUS | Status: AC
  Administered 2023-02-09: 12.5 mg via INTRAVENOUS
  Filled 2023-02-09: qty 12.5

## 2023-02-09 MED ORDER — METOCLOPRAMIDE HCL 5 MG/ML IJ SOLN: 5.0000 mg | Freq: Three times a day (TID) | INTRAMUSCULAR | Status: DC | PRN

## 2023-02-09 MED ORDER — MORPHINE SULFATE (PF) 4 MG/ML IV SOLN
4.0000 mg | Freq: Once | INTRAVENOUS | Status: AC
  Administered 2023-02-09: 4 mg via INTRAVENOUS
  Filled 2023-02-09: qty 1

## 2023-02-09 MED ORDER — LACTATED RINGERS IV SOLN: INTRAVENOUS | Status: DC

## 2023-02-09 MED ORDER — ONDANSETRON HCL 4 MG/2ML IJ SOLN
4.0000 mg | Freq: Once | INTRAMUSCULAR | Status: AC
  Administered 2023-02-09: 4 mg via INTRAVENOUS
  Filled 2023-02-09: qty 2

## 2023-02-09 MED ORDER — ACETAMINOPHEN 325 MG PO TABS
325.0000 mg | ORAL_TABLET | Freq: Four times a day (QID) | ORAL | Status: DC | PRN
  Administered 2023-02-11 – 2023-02-13 (×3): 650 mg via ORAL
  Filled 2023-02-09 (×3): qty 2

## 2023-02-09 MED ORDER — METOCLOPRAMIDE HCL 5 MG PO TABS: 5.0000 mg | ORAL_TABLET | Freq: Three times a day (TID) | ORAL | Status: DC | PRN

## 2023-02-09 MED ORDER — HYDROCHLOROTHIAZIDE 12.5 MG PO TABS
12.5000 mg | ORAL_TABLET | Freq: Every day | ORAL | Status: DC
  Administered 2023-02-09 – 2023-02-14 (×5): 12.5 mg via ORAL
  Filled 2023-02-09 (×6): qty 1

## 2023-02-09 MED ORDER — PROPOFOL 10 MG/ML IV BOLUS
INTRAVENOUS | Status: AC
  Filled 2023-02-09: qty 20

## 2023-02-09 MED ORDER — MORPHINE SULFATE (PF) 4 MG/ML IV SOLN: 4.0000 mg | INTRAVENOUS | Status: DC | PRN

## 2023-02-09 MED ORDER — SODIUM CHLORIDE FLUSH 0.9 % IV SOLN
INTRAVENOUS | Status: AC
  Filled 2023-02-09: qty 10

## 2023-02-09 MED ORDER — ALBUTEROL SULFATE (2.5 MG/3ML) 0.083% IN NEBU: 2.5000 mg | INHALATION_SOLUTION | Freq: Four times a day (QID) | RESPIRATORY_TRACT | Status: DC | PRN

## 2023-02-09 MED ORDER — PANTOPRAZOLE SODIUM 20 MG PO TBEC
20.0000 mg | DELAYED_RELEASE_TABLET | Freq: Every day | ORAL | Status: DC
  Administered 2023-02-09 – 2023-02-18 (×10): 20 mg via ORAL
  Filled 2023-02-09 (×11): qty 1

## 2023-02-09 MED ORDER — SODIUM CHLORIDE (PF) 0.9 % IJ SOLN
INTRAMUSCULAR | Status: DC | PRN
  Administered 2023-02-09: 50 mL via PERIARTICULAR

## 2023-02-09 MED ORDER — ONDANSETRON HCL 4 MG PO TABS: 4.0000 mg | ORAL_TABLET | Freq: Four times a day (QID) | ORAL | Status: DC | PRN

## 2023-02-09 MED ORDER — DEXTROSE-SODIUM CHLORIDE 5-0.9 % IV SOLN: INTRAVENOUS | Status: DC

## 2023-02-09 MED ORDER — ACETAMINOPHEN 10 MG/ML IV SOLN: 1000.0000 mg | Freq: Once | INTRAVENOUS | Status: DC | PRN

## 2023-02-09 MED ORDER — KETAMINE HCL 10 MG/ML IJ SOLN
INTRAMUSCULAR | Status: DC | PRN
  Administered 2023-02-09: 20 mg via INTRAVENOUS
  Administered 2023-02-09: 30 mg via INTRAVENOUS

## 2023-02-09 MED ORDER — 0.9 % SODIUM CHLORIDE (POUR BTL) OPTIME
TOPICAL | Status: DC | PRN
  Administered 2023-02-09: 500 mL

## 2023-02-09 MED ORDER — SODIUM CHLORIDE 0.9 % IV SOLN: INTRAVENOUS | Status: DC

## 2023-02-09 MED ORDER — KETAMINE HCL 50 MG/5ML IJ SOSY
PREFILLED_SYRINGE | INTRAMUSCULAR | Status: AC
  Filled 2023-02-09: qty 5

## 2023-02-09 MED ORDER — MIDAZOLAM HCL 5 MG/5ML IJ SOLN
INTRAMUSCULAR | Status: DC | PRN
  Administered 2023-02-09 (×2): 1 mg via INTRAVENOUS

## 2023-02-09 MED ORDER — ALPRAZOLAM 1 MG PO TABS
1.0000 mg | ORAL_TABLET | Freq: Three times a day (TID) | ORAL | Status: DC | PRN
  Administered 2023-02-10 – 2023-02-17 (×8): 1 mg via ORAL
  Filled 2023-02-09 (×8): qty 1

## 2023-02-09 MED ORDER — ONDANSETRON HCL 4 MG/2ML IJ SOLN
INTRAMUSCULAR | Status: AC
  Filled 2023-02-09: qty 2

## 2023-02-09 MED ORDER — PHENYLEPHRINE HCL (PRESSORS) 10 MG/ML IV SOLN
INTRAVENOUS | Status: DC | PRN
  Administered 2023-02-09: 120 ug via INTRAVENOUS
  Administered 2023-02-09: 80 ug via INTRAVENOUS

## 2023-02-09 MED ORDER — PHENYLEPHRINE HCL-NACL 20-0.9 MG/250ML-% IV SOLN
INTRAVENOUS | Status: DC | PRN
  Administered 2023-02-09: 30 ug/min via INTRAVENOUS

## 2023-02-09 MED ORDER — BUPIVACAINE HCL (PF) 0.25 % IJ SOLN
INTRAMUSCULAR | Status: AC
  Filled 2023-02-09: qty 30

## 2023-02-09 MED ORDER — PHENOL 1.4 % MT LIQD
1.0000 | OROMUCOSAL | Status: DC | PRN
  Administered 2023-02-16: 1 via OROMUCOSAL
  Filled 2023-02-09: qty 177

## 2023-02-09 MED ORDER — ACETAMINOPHEN 500 MG PO TABS
500.0000 mg | ORAL_TABLET | Freq: Four times a day (QID) | ORAL | Status: AC
  Administered 2023-02-09 – 2023-02-10 (×3): 500 mg via ORAL
  Filled 2023-02-09 (×3): qty 1

## 2023-02-09 MED ORDER — FENTANYL CITRATE (PF) 100 MCG/2ML IJ SOLN
INTRAMUSCULAR | Status: AC
  Filled 2023-02-09: qty 2

## 2023-02-09 MED ORDER — ONDANSETRON HCL 4 MG/2ML IJ SOLN: 4.0000 mg | Freq: Four times a day (QID) | INTRAMUSCULAR | Status: DC | PRN

## 2023-02-09 MED ORDER — LACTATED RINGERS IV SOLN: INTRAVENOUS | Status: DC | PRN

## 2023-02-09 MED ORDER — DOCUSATE SODIUM 100 MG PO CAPS
100.0000 mg | ORAL_CAPSULE | Freq: Two times a day (BID) | ORAL | Status: DC
  Administered 2023-02-09 – 2023-02-11 (×5): 100 mg via ORAL
  Filled 2023-02-09 (×5): qty 1

## 2023-02-09 MED ORDER — HYDROCODONE-ACETAMINOPHEN 5-325 MG PO TABS
1.0000 | ORAL_TABLET | ORAL | Status: DC | PRN
  Administered 2023-02-09: 1 via ORAL
  Administered 2023-02-10 – 2023-02-11 (×2): 2 via ORAL
  Administered 2023-02-11 – 2023-02-13 (×4): 1 via ORAL
  Filled 2023-02-09 (×2): qty 1
  Filled 2023-02-09 (×3): qty 2
  Filled 2023-02-09 (×4): qty 1

## 2023-02-09 MED ORDER — OXYCODONE HCL 5 MG PO TABS
5.0000 mg | ORAL_TABLET | Freq: Once | ORAL | Status: DC
  Filled 2023-02-09: qty 1

## 2023-02-09 MED ORDER — PHENYLEPHRINE HCL-NACL 20-0.9 MG/250ML-% IV SOLN
INTRAVENOUS | Status: AC
  Filled 2023-02-09: qty 250

## 2023-02-09 MED ORDER — OXYCODONE HCL 5 MG PO TABS: 5.0000 mg | ORAL_TABLET | Freq: Once | ORAL | Status: DC | PRN

## 2023-02-09 MED ORDER — ONDANSETRON HCL 4 MG/2ML IJ SOLN
INTRAMUSCULAR | Status: DC | PRN
  Administered 2023-02-09: 4 mg via INTRAVENOUS

## 2023-02-09 MED ORDER — ACETAMINOPHEN 10 MG/ML IV SOLN
INTRAVENOUS | Status: DC | PRN
  Administered 2023-02-09: 1000 mg via INTRAVENOUS

## 2023-02-09 MED ORDER — PROPOFOL 500 MG/50ML IV EMUL
INTRAVENOUS | Status: DC | PRN
  Administered 2023-02-09: 125 ug/kg/min via INTRAVENOUS

## 2023-02-09 MED ORDER — TRANEXAMIC ACID 1000 MG/10ML IV SOLN
INTRAVENOUS | Status: AC
  Filled 2023-02-09: qty 10

## 2023-02-09 MED ORDER — BUPIVACAINE LIPOSOME 1.3 % IJ SUSP
INTRAMUSCULAR | Status: AC
  Filled 2023-02-09: qty 20

## 2023-02-09 MED ORDER — CEFAZOLIN SODIUM-DEXTROSE 2-3 GM-%(50ML) IV SOLR
INTRAVENOUS | Status: DC | PRN
  Administered 2023-02-09: 2 g via INTRAVENOUS

## 2023-02-09 MED ORDER — MORPHINE SULFATE (PF) 2 MG/ML IV SOLN
0.5000 mg | INTRAVENOUS | Status: DC | PRN
  Administered 2023-02-10 (×2): 1 mg via INTRAVENOUS
  Filled 2023-02-09 (×2): qty 1

## 2023-02-09 MED ORDER — MIDAZOLAM HCL 2 MG/2ML IJ SOLN
INTRAMUSCULAR | Status: AC
  Filled 2023-02-09: qty 2

## 2023-02-09 SURGICAL SUPPLY — 79 items
ADH SKN CLS APL DERMABOND .7 (GAUZE/BANDAGES/DRESSINGS) ×1
APL PRP STRL LF DISP 70% ISPRP (MISCELLANEOUS) ×2
BLADE SAGITTAL AGGR TOOTH XLG (BLADE) ×1 IMPLANT
BNDG CMPR 5X6 CHSV STRCH STRL (GAUZE/BANDAGES/DRESSINGS) ×1
BNDG COHESIVE 6X5 TAN ST LF (GAUZE/BANDAGES/DRESSINGS) ×1 IMPLANT
CEMENT BONE 10-PACK (Cement) IMPLANT
CHLORAPREP W/TINT 26 (MISCELLANEOUS) ×2 IMPLANT
COVER BACK TABLE REUSABLE LG (DRAPES) ×1 IMPLANT
COVER SET STULBERG POSITIONER (MISCELLANEOUS) ×1 IMPLANT
DERMABOND ADVANCED .7 DNX12 (GAUZE/BANDAGES/DRESSINGS) ×1 IMPLANT
DRAPE 3/4 80X56 (DRAPES) ×1 IMPLANT
DRAPE IMP U-DRAPE 54X76 (DRAPES) ×1 IMPLANT
DRAPE INCISE IOBAN 66X60 STRL (DRAPES) ×1 IMPLANT
DRAPE POUCH INSTRU U-SHP 10X18 (DRAPES) ×1 IMPLANT
DRAPE U-SHAPE 47X51 STRL (DRAPES) ×1 IMPLANT
DRSG MEPILEX SACRM 8.7X9.8 (GAUZE/BANDAGES/DRESSINGS) ×1 IMPLANT
DRSG OPSITE POSTOP 4X10 (GAUZE/BANDAGES/DRESSINGS) IMPLANT
DRSG OPSITE POSTOP 4X12 (GAUZE/BANDAGES/DRESSINGS) IMPLANT
DRSG OPSITE POSTOP 4X8 (GAUZE/BANDAGES/DRESSINGS) IMPLANT
ELECT REM PT RETURN 9FT ADLT (ELECTROSURGICAL) ×1
ELECTRODE REM PT RTRN 9FT ADLT (ELECTROSURGICAL) ×1 IMPLANT
FEMORAL HEAD LFIT V40 28MM PL0 (Orthopedic Implant) IMPLANT
GLOVE BIO SURGEON STRL SZ8 (GLOVE) ×1 IMPLANT
GLOVE BIOGEL PI IND STRL 8 (GLOVE) ×1 IMPLANT
GLOVE PI ORTHO PRO STRL 7.5 (GLOVE) ×2 IMPLANT
GLOVE PI ORTHO PRO STRL SZ8 (GLOVE) ×2 IMPLANT
GLOVE SURG SYN 7.5  E (GLOVE) ×2
GLOVE SURG SYN 7.5 E (GLOVE) ×2 IMPLANT
GLOVE SURG SYN 7.5 PF PI (GLOVE) ×2 IMPLANT
GOWN SRG XL LVL 3 NONREINFORCE (GOWNS) ×1 IMPLANT
GOWN STRL NON-REIN TWL XL LVL3 (GOWNS) ×1
GOWN STRL REUS W/ TWL LRG LVL3 (GOWN DISPOSABLE) ×1 IMPLANT
GOWN STRL REUS W/ TWL XL LVL3 (GOWN DISPOSABLE) ×1 IMPLANT
GOWN STRL REUS W/TWL LRG LVL3 (GOWN DISPOSABLE) ×1
GOWN STRL REUS W/TWL XL LVL3 (GOWN DISPOSABLE) ×1
HANDLE YANKAUER SUCT OPEN TIP (MISCELLANEOUS) ×1 IMPLANT
HEAD COMP BIPOLAR 28X45 (Head) IMPLANT
HOOD PEEL AWAY T7 (MISCELLANEOUS) ×2 IMPLANT
IV NS 100ML SINGLE PACK (IV SOLUTION) ×1 IMPLANT
IV NS IRRIG 3000ML ARTHROMATIC (IV SOLUTION) ×1 IMPLANT
KIT PREP HIP W/CEMENT RESTRICT (Miscellaneous) IMPLANT
KIT PREPARATION TOTAL HIP (Miscellaneous) ×1 IMPLANT
KIT TURNOVER KIT A (KITS) ×1 IMPLANT
MANIFOLD NEPTUNE II (INSTRUMENTS) ×1 IMPLANT
MAT ABSORB  FLUID 56X50 GRAY (MISCELLANEOUS) ×1
MAT ABSORB FLUID 56X50 GRAY (MISCELLANEOUS) ×1 IMPLANT
NDL FILTER BLUNT 18X1 1/2 (NEEDLE) ×1 IMPLANT
NDL SAFETY ECLIP 18X1.5 (MISCELLANEOUS) ×1 IMPLANT
NDL SPNL 20GX3.5 QUINCKE YW (NEEDLE) ×1 IMPLANT
NEEDLE FILTER BLUNT 18X1 1/2 (NEEDLE) ×1 IMPLANT
NEEDLE SPNL 20GX3.5 QUINCKE YW (NEEDLE) ×1 IMPLANT
PACK HIP PROSTHESIS (MISCELLANEOUS) ×1 IMPLANT
PENCIL SMOKE EVACUATOR (MISCELLANEOUS) ×1 IMPLANT
PILLOW ABDUCTION FOAM SM (MISCELLANEOUS) ×1 IMPLANT
PULSAVAC PLUS IRRIG FAN TIP (DISPOSABLE) ×1
RETRIEVER SUT HEWSON (MISCELLANEOUS) ×1 IMPLANT
SLEEVE SCD COMPRESS KNEE MED (STOCKING) ×1 IMPLANT
SOLUTION IRRIG SURGIPHOR (IV SOLUTION) ×1 IMPLANT
SPACER CENTRAL ACCOLADE 4/5X14 (Spacer) IMPLANT
STEM ACCOLADE 4X35X137 132D (Stem) IMPLANT
SUT BONE WAX W31G (SUTURE) ×1 IMPLANT
SUT DVC 2 QUILL PDO  T11 36X36 (SUTURE) ×1
SUT DVC 2 QUILL PDO T11 36X36 (SUTURE) ×1 IMPLANT
SUT ETHIBOND #5 BRAIDED 30INL (SUTURE) ×1 IMPLANT
SUT QUILL MONODERM 3-0 PS-2 (SUTURE) ×1 IMPLANT
SUT VIC AB 0 CT1 36 (SUTURE) ×1 IMPLANT
SUT VIC AB 2-0 CT2 27 (SUTURE) ×2 IMPLANT
SUT VICRYL 1-0 27IN ABS (SUTURE) ×1
SUTURE VICRYL 1-0 27IN ABS (SUTURE) ×1 IMPLANT
SYR 30ML LL (SYRINGE) ×2 IMPLANT
SYR TB 1ML LL NO SAFETY (SYRINGE) ×1 IMPLANT
TIP FAN IRRIG PULSAVAC PLUS (DISPOSABLE) ×1 IMPLANT
TIP IRRIG/SUCT HIGH CAPACITY (MISCELLANEOUS) IMPLANT
TOWEL OR 17X26 4PK STRL BLUE (TOWEL DISPOSABLE) IMPLANT
TOWER CARTRIDGE SMART MIX (DISPOSABLE) IMPLANT
TRAP FLUID SMOKE EVACUATOR (MISCELLANEOUS) ×1 IMPLANT
TRAY FOLEY SLVR 16FR LF STAT (SET/KITS/TRAYS/PACK) ×1 IMPLANT
WAND WEREWOLF FASTSEAL 6.0 (MISCELLANEOUS) ×1 IMPLANT
WATER STERILE IRR 1000ML POUR (IV SOLUTION) ×1 IMPLANT

## 2023-02-09 NOTE — H&P (View-Only) (Signed)
ORTHOPAEDIC CONSULTATION  REQUESTING PHYSICIAN: Floydene Flock, MD  Chief Complaint:   Displaced right femoral neck fracture  History of Present Illness: Molly Leonard is a 77 y.o. female with a past medical history of anxiety, depression, asthma/COPD who presents after mechanical fall coming out of the house this morning tripping on her front step landing on her right hip.  Patient denies head strike or loss of consciousness family is at bedside assisting with history.  Patient denies any weakness or prior hip pain before the fall or any other syncopal sequela.  She denies any nausea or vomiting.  Patient reports she last had a couple coffee at 6 AM this morning and has not had any other food or drink since that time.  Patient reports that she is not on any blood thinner.  She denies chest pain, fevers, chills, or other injuries.  Past Medical History:  Diagnosis Date   Anxiety    Depression    Past Surgical History:  Procedure Laterality Date   ABDOMINAL HYSTERECTOMY  1973   Social History   Socioeconomic History   Marital status: Married    Spouse name: Not on file   Number of children: 3   Years of education: Not on file   Highest education level: 12th grade  Occupational History   Occupation: Owner    Comment: full time  Tobacco Use   Smoking status: Every Day    Packs/day: .75    Types: Cigarettes   Smokeless tobacco: Never  Vaping Use   Vaping Use: Former  Substance and Sexual Activity   Alcohol use: No   Drug use: No   Sexual activity: Not Currently  Other Topics Concern   Not on file  Social History Narrative   Not on file   Social Determinants of Health   Financial Resource Strain: Low Risk  (10/28/2021)   Overall Financial Resource Strain (CARDIA)    Difficulty of Paying Living Expenses: Not hard at all  Food Insecurity: No Food Insecurity (02/09/2023)   Hunger Vital Sign    Worried About  Running Out of Food in the Last Year: Never true    Ran Out of Food in the Last Year: Never true  Transportation Needs: No Transportation Needs (02/09/2023)   PRAPARE - Administrator, Civil Service (Medical): No    Lack of Transportation (Non-Medical): No  Physical Activity: Unknown (10/28/2021)   Exercise Vital Sign    Days of Exercise per Week: Not on file    Minutes of Exercise per Session: 0 min  Stress: No Stress Concern Present (10/28/2021)   [employer] of Occupational Health - Occupational Stress Questionnaire    Feeling of Stress : Only a little  Social Connections: Moderately Isolated (10/28/2021)   Social Connection and Isolation Panel [NHANES]    Frequency of Communication with Friends and Family: More than three times a week    Frequency of Social Gatherings with Friends and Family: More than three times a week    Attends Religious Services: Never    Database administrator or Organizations: No    Attends Engineer, structural: Never    Marital Status: Married   Family History  Problem Relation Age of Onset   Heart disease Mother    Diabetes Mother    Multiple sclerosis Sister    Bone cancer Brother    No Known Allergies Prior to Admission medications   Medication Sig Start Date End Date Taking? Authorizing Provider  ALPRAZolam (XANAX) 1 MG tablet TAKE ONE TABLET THREE TIMES A DAY AS NEEDED FOR ANXIETY Patient taking differently: Take 1 mg by mouth 3 (three) times daily as needed for anxiety. 12/09/22  Yes Alfredia Ferguson, PA-C  FLUoxetine (PROZAC) 20 MG capsule TAKE 2 CAPSULES BY MOUTH DAILY 11/03/22  Yes Drubel, Lillia Abed, PA-C  fluticasone (FLONASE) 50 MCG/ACT nasal spray Place 2 sprays into both nostrils daily. 03/18/22  Yes Drubel, Lillia Abed, PA-C  folic acid (FOLVITE) 1 MG tablet TAKE ONE TABLET BY MOUTH DAILY. 02/02/23  Yes Alfredia Ferguson, PA-C  hydrochlorothiazide (HYDRODIURIL) 12.5 MG tablet TAKE 1 TABLET BY MOUTH DAILY 02/02/23  Yes Drubel,  Lillia Abed, PA-C  albuterol (VENTOLIN HFA) 108 (90 Base) MCG/ACT inhaler Inhale 2 puffs into the lungs every 6 (six) hours as needed for wheezing or shortness of breath. Patient not taking: Reported on 02/09/2023 09/23/21   Alfredia Ferguson, PA-C   DG Femur Min 2 Views Right  Result Date: 02/09/2023 CLINICAL DATA:  Fall, right hip fracture. EXAM: RIGHT FEMUR 2 VIEWS COMPARISON:  Hip radiograph performed earlier on the same date. FINDINGS: Displaced subcapital right femoral neck fracture. Distal femur is intact. Tricompartmental knee joint space narrowing. No appreciable knee joint effusion. IMPRESSION: Displaced subcapital right femoral neck fracture. Distal femur is intact. Electronically Signed   By: Larose Hires D.O.   On: 02/09/2023 12:16   CT Head Wo Contrast  Result Date: 02/09/2023 CLINICAL DATA:  Fall, trauma, possible head injury EXAM: CT HEAD WITHOUT CONTRAST TECHNIQUE: Contiguous axial images were obtained from the base of the skull through the vertex without intravenous contrast. RADIATION DOSE REDUCTION: This exam was performed according to the departmental dose-optimization program which includes automated exposure control, adjustment of the mA and/or kV according to patient size and/or use of iterative reconstruction technique. COMPARISON:  None Available. FINDINGS: Brain: Age related atrophy pattern and scattered chronic white matter microvascular ischemic changes throughout both cerebral hemispheres. No acute intracranial hemorrhage, new mass lesion, new infarction, midline shift, herniation, hydrocephalus, or extra-axial fluid collection. No focal mass effect or edema. Cisterns are patent. No cerebellar abnormality. Vascular: No hyperdense vessel or unexpected calcification. Skull: Normal. Negative for fracture or focal lesion. Sinuses/Orbits: No acute finding. Other: None. IMPRESSION: 1. Atrophy and chronic white matter microvascular ischemic changes. 2. No acute intracranial abnormality by  noncontrast CT. Electronically Signed   By: Judie Petit.  Shick M.D.   On: 02/09/2023 09:57   DG Chest 1 View  Result Date: 02/09/2023 CLINICAL DATA:  Fall today. EXAM: CHEST  1 VIEW COMPARISON:  November 08, 2016. FINDINGS: Mild cardiomegaly. No acute pulmonary disease. Bony thorax is unremarkable. IMPRESSION: No active disease. Electronically Signed   By: Lupita Raider M.D.   On: 02/09/2023 09:57   DG Hip Unilat W or Wo Pelvis 2-3 Views Right  Result Date: 02/09/2023 CLINICAL DATA:  Fall today. EXAM: DG HIP (WITH OR WITHOUT PELVIS) 2-3V RIGHT COMPARISON:  None Available. FINDINGS: Moderately displaced proximal right femoral neck fracture is noted. Left hip is unremarkable. IMPRESSION: Moderately displaced proximal right femoral neck fracture. Electronically Signed   By: Lupita Raider M.D.   On: 02/09/2023 09:56    Positive ROS: All other systems have been reviewed and were otherwise negative with the exception of those mentioned in the HPI and as above.  Physical Exam: General:  Alert, no acute distress Psychiatric:  Patient is competent for consent with normal mood and affect   Cardiovascular:  No pedal edema Respiratory:  No wheezing, non-labored breathing  GI:  Abdomen is soft and non-tender Skin:  No lesions in the area of chief complaint Neurologic:  Sensation intact distally Lymphatic:  No axillary or cervical lymphadenopathy  Orthopedic Exam:  Right lower extremity Skin intact with mild swelling over the hip Tender to palpation over the hip Leg is shortened and externally rotated No tenderness to palpation over the knee shin, ankle, foot or toes Able to move the knee ankle and toes without pain, pain with motion of the hip Able to dorsiflex and plantarflex the right foot sensation intact throughout the entire extremity Neurovascular intact to the foot warm and well-perfused Compartments all soft  Secondary survey No tenderness to palpation over other bony prominences in the lower  extremities or bilateral upper extremities No pain with logroll or simulated axial loading of the left lower extremity All compartments soft No tenderness to palpation over the cervical or thoracic spine, no bony step-off Motor grossly intact throughout, no focal deficits Sensation grossly intact throughout, no focal deficits Good distal pulses and capillary refill on all extremities   X-rays:  X-ray of the right hip and femur taken today in the hospital images and reports reviewed by myself.  There is displaced right femoral neck fracture which is acute appearing.  No other fractures noted of the pelvis or femur.  Degenerative changes noted to the knee.  Agree with radiology interpretation.  Assessment: Displaced right femoral neck fracture  Plan: And is a 77 year old female who presents with a displaced right femoral neck fracture.  I discussed treatment options with the patient and family including but not limited to surgical intervention and nonoperative management.  We discussed the risks and benefits of surgical intervention versus nonoperative management and the patient and family agree with the plan to proceed with operative intervention for her displaced right femoral neck fracture. The patient will need a right hip hemiarthroplasty.  A long discussion took place with the patient describing what a hemiarthroplasty is and what the procedure would entail. The xrays were reviewed with the patient and the implants were discussed. The ability to secure the implant utilizing cement/ or cementless (press fit) fixation was discussed. Surgical exposures were discussed with the patient.    The hospitalization and post-operative care and rehabilitation were also discussed. The use of perioperative antibiotics and DVT prophylaxis were discussed. The risk, benefits and alternatives to a surgical intervention were discussed at length with the patient. The patient was also advised of risks related to the  medical comorbidities. A lengthy discussion took place to review the most common complications including but not limited to: deep vein thrombosis, pulmonary embolus, heart attack, stroke, infection, wound breakdown, dislocation, numbness, leg length in-equality, damage to nerves, intraoperative fracture, tendon,muscles, arteries or other blood vessels, death and other possible complications from anesthesia. The patient was told that we will take steps to minimize these risks by using sterile technique, antibiotics and DVT prophylaxis when appropriate and follow the patient postoperatively in the office setting to monitor progress. The possibility of recurrent pain, no improvement in pain and actual worsening of pain were also discussed with the patient. The risk of dislocation following surgery was discussed and potential precautions to prevent dislocation were reviewed.      The benefits of surgery were discussed with the patient including the potential for improving the patient's current clinical condition through operative intervention. Alternatives to surgical intervention including conservative management were also discussed in detail. All questions were answered to the satisfaction of the patient. The patient  participated and agreed to the plan of care as well as the use of the recommended implants for their surgery.    Plan for surgery this afternoon 02/09/2023 around 3:30 PM N.p.o. for the operating room Hold anticoagulation for OR today  Reinaldo Berber MD     Reinaldo Berber MD  Beeper #:  [phone]  02/09/2023 12:50 PM

## 2023-02-09 NOTE — Consult Note (Signed)
ORTHOPAEDIC CONSULTATION  REQUESTING PHYSICIAN: Newton, Steven J, MD  Chief Complaint:   Displaced right femoral neck fracture  History of Present Illness: Molly Leonard is a 77 y.o. female with a past medical history of anxiety, depression, asthma/COPD who presents after mechanical fall coming out of the house this morning tripping on her front step landing on her right hip.  Patient denies head strike or loss of consciousness family is at bedside assisting with history.  Patient denies any weakness or prior hip pain before the fall or any other syncopal sequela.  She denies any nausea or vomiting.  Patient reports she last had a couple coffee at 6 AM this morning and has not had any other food or drink since that time.  Patient reports that she is not on any blood thinner.  She denies chest pain, fevers, chills, or other injuries.  Past Medical History:  Diagnosis Date   Anxiety    Depression    Past Surgical History:  Procedure Laterality Date   ABDOMINAL HYSTERECTOMY  1973   Social History   Socioeconomic History   Marital status: Married    Spouse name: Not on file   Number of children: 3   Years of education: Not on file   Highest education level: 12th grade  Occupational History   Occupation: Owner    Comment: full time  Tobacco Use   Smoking status: Every Day    Packs/day: .75    Types: Cigarettes   Smokeless tobacco: Never  Vaping Use   Vaping Use: Former  Substance and Sexual Activity   Alcohol use: No   Drug use: No   Sexual activity: Not Currently  Other Topics Concern   Not on file  Social History Narrative   Not on file   Social Determinants of Health   Financial Resource Strain: Low Risk  (10/28/2021)   Overall Financial Resource Strain (CARDIA)    Difficulty of Paying Living Expenses: Not hard at all  Food Insecurity: No Food Insecurity (02/09/2023)   Hunger Vital Sign    Worried About  Running Out of Food in the Last Year: Never true    Ran Out of Food in the Last Year: Never true  Transportation Needs: No Transportation Needs (02/09/2023)   PRAPARE - Transportation    Lack of Transportation (Medical): No    Lack of Transportation (Non-Medical): No  Physical Activity: Unknown (10/28/2021)   Exercise Vital Sign    Days of Exercise per Week: Not on file    Minutes of Exercise per Session: 0 min  Stress: No Stress Concern Present (10/28/2021)   [employer] - Occupational Stress Questionnaire    Feeling of Stress : Only a little  Social Connections: Moderately Isolated (10/28/2021)   Social Connection and Isolation Panel [NHANES]    Frequency of Communication with Friends and Family: More than three times a week    Frequency of Social Gatherings with Friends and Family: More than three times a week    Attends Religious Services: Never    Active Member of Clubs or Organizations: No    Attends Club or Organization Meetings: Never    Marital Status: Married   Family History  Problem Relation Age of Onset   Heart disease Mother    Diabetes Mother    Multiple sclerosis Sister    Bone cancer Brother    No Known Allergies Prior to Admission medications   Medication Sig Start Date End Date Taking? Authorizing Provider    ALPRAZolam (XANAX) 1 MG tablet TAKE ONE TABLET THREE TIMES A DAY AS NEEDED FOR ANXIETY Patient taking differently: Take 1 mg by mouth 3 (three) times daily as needed for anxiety. 12/09/22  Yes Drubel, Lindsay, PA-C  FLUoxetine (PROZAC) 20 MG capsule TAKE 2 CAPSULES BY MOUTH DAILY 11/03/22  Yes Drubel, Lindsay, PA-C  fluticasone (FLONASE) 50 MCG/ACT nasal spray Place 2 sprays into both nostrils daily. 03/18/22  Yes Drubel, Lindsay, PA-C  folic acid (FOLVITE) 1 MG tablet TAKE ONE TABLET BY MOUTH DAILY. 02/02/23  Yes Drubel, Lindsay, PA-C  hydrochlorothiazide (HYDRODIURIL) 12.5 MG tablet TAKE 1 TABLET BY MOUTH DAILY 02/02/23  Yes Drubel,  Lindsay, PA-C  albuterol (VENTOLIN HFA) 108 (90 Base) MCG/ACT inhaler Inhale 2 puffs into the lungs every 6 (six) hours as needed for wheezing or shortness of breath. Patient not taking: Reported on 02/09/2023 09/23/21   Drubel, Lindsay, PA-C   DG Femur Min 2 Views Right  Result Date: 02/09/2023 CLINICAL DATA:  Fall, right hip fracture. EXAM: RIGHT FEMUR 2 VIEWS COMPARISON:  Hip radiograph performed earlier on the same date. FINDINGS: Displaced subcapital right femoral neck fracture. Distal femur is intact. Tricompartmental knee joint space narrowing. No appreciable knee joint effusion. IMPRESSION: Displaced subcapital right femoral neck fracture. Distal femur is intact. Electronically Signed   By: Imran  Ahmed D.O.   On: 02/09/2023 12:16   CT Head Wo Contrast  Result Date: 02/09/2023 CLINICAL DATA:  Fall, trauma, possible head injury EXAM: CT HEAD WITHOUT CONTRAST TECHNIQUE: Contiguous axial images were obtained from the base of the skull through the vertex without intravenous contrast. RADIATION DOSE REDUCTION: This exam was performed according to the departmental dose-optimization program which includes automated exposure control, adjustment of the mA and/or kV according to patient size and/or use of iterative reconstruction technique. COMPARISON:  None Available. FINDINGS: Brain: Age related atrophy pattern and scattered chronic white matter microvascular ischemic changes throughout both cerebral hemispheres. No acute intracranial hemorrhage, new mass lesion, new infarction, midline shift, herniation, hydrocephalus, or extra-axial fluid collection. No focal mass effect or edema. Cisterns are patent. No cerebellar abnormality. Vascular: No hyperdense vessel or unexpected calcification. Skull: Normal. Negative for fracture or focal lesion. Sinuses/Orbits: No acute finding. Other: None. IMPRESSION: 1. Atrophy and chronic white matter microvascular ischemic changes. 2. No acute intracranial abnormality by  noncontrast CT. Electronically Signed   By: M.  Shick M.D.   On: 02/09/2023 09:57   DG Chest 1 View  Result Date: 02/09/2023 CLINICAL DATA:  Fall today. EXAM: CHEST  1 VIEW COMPARISON:  November 08, 2016. FINDINGS: Mild cardiomegaly. No acute pulmonary disease. Bony thorax is unremarkable. IMPRESSION: No active disease. Electronically Signed   By: James  Green Jr M.D.   On: 02/09/2023 09:57   DG Hip Unilat W or Wo Pelvis 2-3 Views Right  Result Date: 02/09/2023 CLINICAL DATA:  Fall today. EXAM: DG HIP (WITH OR WITHOUT PELVIS) 2-3V RIGHT COMPARISON:  None Available. FINDINGS: Moderately displaced proximal right femoral neck fracture is noted. Left hip is unremarkable. IMPRESSION: Moderately displaced proximal right femoral neck fracture. Electronically Signed   By: James  Green Jr M.D.   On: 02/09/2023 09:56    Positive ROS: All other systems have been reviewed and were otherwise negative with the exception of those mentioned in the HPI and as above.  Physical Exam: General:  Alert, no acute distress Psychiatric:  Patient is competent for consent with normal mood and affect   Cardiovascular:  No pedal edema Respiratory:  No wheezing, non-labored breathing   GI:  Abdomen is soft and non-tender Skin:  No lesions in the area of chief complaint Neurologic:  Sensation intact distally Lymphatic:  No axillary or cervical lymphadenopathy  Orthopedic Exam:  Right lower extremity Skin intact with mild swelling over the hip Tender to palpation over the hip Leg is shortened and externally rotated No tenderness to palpation over the knee shin, ankle, foot or toes Able to move the knee ankle and toes without pain, pain with motion of the hip Able to dorsiflex and plantarflex the right foot sensation intact throughout the entire extremity Neurovascular intact to the foot warm and well-perfused Compartments all soft  Secondary survey No tenderness to palpation over other bony prominences in the lower  extremities or bilateral upper extremities No pain with logroll or simulated axial loading of the left lower extremity All compartments soft No tenderness to palpation over the cervical or thoracic spine, no bony step-off Motor grossly intact throughout, no focal deficits Sensation grossly intact throughout, no focal deficits Good distal pulses and capillary refill on all extremities   X-rays:  X-ray of the right hip and femur taken today in the hospital images and reports reviewed by myself.  There is displaced right femoral neck fracture which is acute appearing.  No other fractures noted of the pelvis or femur.  Degenerative changes noted to the knee.  Agree with radiology interpretation.  Assessment: Displaced right femoral neck fracture  Plan: And is a 77-year-old female who presents with a displaced right femoral neck fracture.  I discussed treatment options with the patient and family including but not limited to surgical intervention and nonoperative management.  We discussed the risks and benefits of surgical intervention versus nonoperative management and the patient and family agree with the plan to proceed with operative intervention for her displaced right femoral neck fracture. The patient will need a right hip hemiarthroplasty.  A long discussion took place with the patient describing what a hemiarthroplasty is and what the procedure would entail. The xrays were reviewed with the patient and the implants were discussed. The ability to secure the implant utilizing cement/ or cementless (press fit) fixation was discussed. Surgical exposures were discussed with the patient.    The hospitalization and post-operative care and rehabilitation were also discussed. The use of perioperative antibiotics and DVT prophylaxis were discussed. The risk, benefits and alternatives to a surgical intervention were discussed at length with the patient. The patient was also advised of risks related to the  medical comorbidities. A lengthy discussion took place to review the most common complications including but not limited to: deep vein thrombosis, pulmonary embolus, heart attack, stroke, infection, wound breakdown, dislocation, numbness, leg length in-equality, damage to nerves, intraoperative fracture, tendon,muscles, arteries or other blood vessels, death and other possible complications from anesthesia. The patient was told that we will take steps to minimize these risks by using sterile technique, antibiotics and DVT prophylaxis when appropriate and follow the patient postoperatively in the office setting to monitor progress. The possibility of recurrent pain, no improvement in pain and actual worsening of pain were also discussed with the patient. The risk of dislocation following surgery was discussed and potential precautions to prevent dislocation were reviewed.      The benefits of surgery were discussed with the patient including the potential for improving the patient's current clinical condition through operative intervention. Alternatives to surgical intervention including conservative management were also discussed in detail. All questions were answered to the satisfaction of the patient. The patient   participated and agreed to the plan of care as well as the use of the recommended implants for their surgery.    Plan for surgery this afternoon 02/09/2023 around 3:30 PM N.p.o. for the operating room Hold anticoagulation for OR today  Raad Clayson MD     Keela Rubert MD  Beeper #:  [phone]  02/09/2023 12:50 PM  

## 2023-02-09 NOTE — Anesthesia Procedure Notes (Addendum)
Spinal  Patient location during procedure: OR Start time: 02/09/2023 4:05 PM End time: 02/09/2023 4:10 PM Reason for block: surgical anesthesia Staffing Performed: resident/CRNA  Resident/CRNA: Allayne Butcher, RN Performed by: Jeannene Patella, CRNA Authorized by: Corinda Gubler, MD   Preanesthetic Checklist Completed: patient identified, IV checked, site marked, risks and benefits discussed, surgical consent, monitors and equipment checked, pre-op evaluation and timeout performed Spinal Block Patient position: left lateral decubitus Prep: ChloraPrep Patient monitoring: heart rate, continuous pulse ox, blood pressure and cardiac monitor Approach: midline Location: L4-5 Injection technique: single-shot Needle Needle type: Introducer and Pencan  Needle gauge: 24 G Needle length: 9 cm Assessment Sensory level: T4 Events: CSF return Additional Notes Negative paresthesia. Negative blood return. Positive free-flowing CSF. Expiration date of kit checked and confirmed. Patient tolerated procedure well, without complications.

## 2023-02-09 NOTE — Op Note (Signed)
Patient Name: Molly Leonard  WUJ:[id number]  Pre-Operative Diagnosis: Right displaced femoral neck fracture  Post-Operative Diagnosis: (same)  Procedure: Right hip cemented hemiarthroplasty  Components/Implants:  Stem: Accolade C #4 x 132 deg with 14mm distal spacer  Head: Lfit V40 28 +0 inner ball with 45/28 UHR boploar outer ball  Date of Surgery: 02/09/2023  Surgeon: Reinaldo Berber MD  Assistant: scrub tech assist   Anesthesiologist: Dr. Suzan Slick  Anesthesia: Spinal   IVF:600cc  EBL: 150cc  Complications: None   Brief history: The patient is a 77 year old female with a displaced right femoral neck fracture. A thorough discussion about treatment options including alternatives to surgery were discussed with the patient and family and the risks and benefits of cemented hemiarthroplasty as definitive surgical fixation were reviewed.  The patient was admitted to the hospital by the medical team and preoperative optimization including risk stratification and clearance for surgery was performed. All preoperative films were reviewed and an appropriate surgical plan was made prior to surgery.   Description of procedure: The patient was brought to the operating room where laterality was confirmed by all those present to be the right side.  The patient was moved to the table and administered spinal anesthesia. Patient was given an intravenous dose of antibiotics for surgical prophylaxis and TXA. The patient was positioned in lateral decubitus position with all bony prominences well-padded.  Surgical site was prepped with alcohol and chlorhexidine.  Surgical site over the hip was draped in typical sterile fashion with multiple layers of adhesive and nonadhesive drapes.  The incision site over the greater trochanter posteriorly was marked out with a sterile marker.   Surgical timeout was then called with participation of all staff in the room the patient was confirmed and laterality again confirmed.   An incision was made over the lateral aspect of the hip cheating posteriorly on the proximal aspect.  Careful soft tissue dissection and coagulation of all bleeders was carried out down to the level of the glut max fascia.  The fascia was carefully incised in line with the femur.  A Charnley retractor was placed deep to the fascia with care taken to ensure that there was no nerve entrapment in the retractor.  The bursa tissue was taken down over the posterior femur exposing the external rotators.  A dull Cobra retractor was placed under the abductor mechanism to protect the mechanism and fully expose the piriformis and short external rotators.  The external rotators were carefully detached from the femur with electrocautery and tagged with Ethibond sutures.  The capsule to the hip was incised and tagged with sutures.  Care was taken to ensure the labrum was not cut during the approach. The femoral neck fracture was encountered at this time and shown to be displaced.  The femur was rotated up and a retractor was placed behind it.  A template was used to mark out a femoral neck cleanup cut.  Cobra retractors were placed superiorly inferiorly along the remaining femoral neck to protect from saw excursion.  An oscillating saw was then used to perform a new femoral neck cut.   After the femoral and neck resection attention was turned back to the broken femoral head which remained in the acetabulum.  A corkscrew was used to carefully remove the remaining femoral head without penetrating into the acetabulum or damaging the cartilage.  The acetabulum was then carefully irrigated to remove any bony fragments or remaining debris.  The femoral head was then measured and trial balls  were used to assess the size of the acetabulum.  The acetabulum was found to accommodate a size 45 head with a good suction cup fit and no overstuffing.  Attention was turned back to the femur and a femoral neck retractor was placed.  The femur  was opened with a box osteotome and canal finder.  The femur was then sequentially broached up to a size 4 broach which allowed for good fit and fill.  A calcar planer was then used to smooth out the calcar.  A trial neck and head were then attached and the hip was reduced.  The hip was found to be stable on reduction with full range of motion without subluxation or dislocation and leg lengths felt equal.  The hip was carefully dislocated the head and neck trial were removed.  The femur was then exposed again.  A lap was placed within the acetabulum to protect from any cement.  A canal restrictor was then placed distally within the femoral canal.  Anesthesia was alerted that we were going to begin femoral preparation and cementation.  The canal was irrigated with copious saline via long tip pulsatile lavage.  The canal was then scrubbed with a brush reirrigated and aspirated.  A suction drying sponge was then placed within the canal and hooked to suction while the cement was being prepared.  A cement gun was prepared and the canal was carefully filled with cement and then pressurized.  The implant was pressed into the cement mantle with care taken to maintain an appropriate version of approximately 15 degrees and tapped into place.  Excess cement was carefully removed and the implant was held in place until the cement fully cured.  After full curing of the cement the acetabulum was reirrigated and ensured to be clear of any debris.  A trial ball was placed on the femoral component reduced and taken through range of motion.  The hip was found to be stable and reduction with a full range of motion without subluxation or dislocation with equal leg lengths.  The hip was brought up into 90 degrees of flexion and internal rotation and was stable up to 85 degrees.  The hip was fully stable in sleeper position and to external rotation and extension.  The hip was then carefully dislocated the trial ball was removed and  a real bipolar component was impacted into place and checked for stability after cleaning the trunnion. The acetabulum was irrigated and the hip was reduced.  The hip showed good range of motion and stability on testing with both stability in flexion internal rotation and extension external rotation.  The hip was then irrigated with betadine based surgiphor solution and pulsatile lavage with saline.  A 2 mm drill bit was then used to make 2 holes in the greater trochanter the piriformis and capsular tissues were reapproximated and passed through the drill holes and tied over the greater trochanter.  The fascia was then approximated with #1 Vicryl and #2 barbed suture.  The subcutaneous tissues and skin were closed with 0 Vicryl 2-0 Vicryl and 3-0 V lock suture and the skin closed with Dermabond.  A sterile dressing was then applied.  Lap, sharps, and sponge counts were correct at the end of the case.   The patient was then rolled supine and an x-ray was taken in the operating room. Leg lengths were clinically equal on examination with a good distal pulse. Components appeared in good position with no fractures noted on x-ray.  Patient was then transferred to a hospital bed and transferred to the recovery room in stable condition.

## 2023-02-09 NOTE — H&P (Signed)
History and Physical    Patient: Molly Leonard:[id number] DOB: 03-24-1946 DOA: 02/09/2023 DOS: the patient was seen and examined on 02/09/2023 PCP: Alfredia Ferguson, PA-C  Patient coming from: Home  Chief Complaint:  Chief Complaint  Patient presents with   Fall   Hip Pain   HPI: Molly Leonard is a 77 y.o. female with medical history significant of anxiety, depression, asthma/COPD presenting with fall and hip fracture.  Patient reports mechanical fall when patient tripped coming out of the house today.  Patient subsequently landed on her right hip.  No reported head trauma loss conscious.  Patient denies any weakness or dizziness prior to fall.  Has had significant right hip pain after the fall with difficulty with ambulation.  No chest pain or shortness of breath.  No nausea or vomiting.  No abdominal pain or diarrhea. Presented to the ER afebrile, hemodynamically stable.  CBC and CMP are pending.  CT head stable.  Hip plain films positive for moderately displaced proximal right femoral neck fracture.  Dr. Audelia Acton w/ orthopedic surgery aware of case and is tentatively planning operative repair. Review of Systems: As mentioned in the history of present illness. All other systems reviewed and are negative. Past Medical History:  Diagnosis Date   Anxiety    Depression    Past Surgical History:  Procedure Laterality Date   ABDOMINAL HYSTERECTOMY  1973   Social History:  reports that she has been smoking cigarettes. She has been smoking an average of .75 packs per day. She has never used smokeless tobacco. She reports that she does not drink alcohol and does not use drugs.  No Known Allergies  Family History  Problem Relation Age of Onset   Heart disease Mother    Diabetes Mother    Multiple sclerosis Sister    Bone cancer Brother     Prior to Admission medications   Medication Sig Start Date End Date Taking? Authorizing Provider  ALPRAZolam (XANAX) 1 MG tablet TAKE ONE TABLET THREE  TIMES A DAY AS NEEDED FOR ANXIETY Patient taking differently: Take 1 mg by mouth 3 (three) times daily as needed for anxiety. 12/09/22  Yes Alfredia Ferguson, PA-C  FLUoxetine (PROZAC) 20 MG capsule TAKE 2 CAPSULES BY MOUTH DAILY 11/03/22  Yes Drubel, Lillia Abed, PA-C  fluticasone (FLONASE) 50 MCG/ACT nasal spray Place 2 sprays into both nostrils daily. 03/18/22  Yes Drubel, Lillia Abed, PA-C  folic acid (FOLVITE) 1 MG tablet TAKE ONE TABLET BY MOUTH DAILY. 02/02/23  Yes Alfredia Ferguson, PA-C  hydrochlorothiazide (HYDRODIURIL) 12.5 MG tablet TAKE 1 TABLET BY MOUTH DAILY 02/02/23  Yes Drubel, Lillia Abed, PA-C  albuterol (VENTOLIN HFA) 108 (90 Base) MCG/ACT inhaler Inhale 2 puffs into the lungs every 6 (six) hours as needed for wheezing or shortness of breath. Patient not taking: Reported on 02/09/2023 09/23/21   Alfredia Ferguson, PA-C    Physical Exam: Vitals:   02/09/23 1040 02/09/23 1045 02/09/23 1100 02/09/23 1115  BP: (!) 140/62  133/74   Pulse:  81 81 79  Resp:  17 15 13   Temp:   97.8 F (36.6 C)   TempSrc:   Oral   SpO2:  100% 100% 100%  Weight:      Height:       Physical Exam Constitutional:      Appearance: She is normal weight.  HENT:     Head: Normocephalic and atraumatic.     Nose: Nose normal.     Mouth/Throat:     Mouth: Mucous  membranes are dry.  Eyes:     Pupils: Pupils are equal, round, and reactive to light.  Cardiovascular:     Rate and Rhythm: Normal rate and regular rhythm.  Pulmonary:     Effort: Pulmonary effort is normal.  Abdominal:     General: Bowel sounds are normal.  Musculoskeletal:     Cervical back: Normal range of motion.     Comments: Positive tenderness palpation of the right hip Decreased range of motion of the right lower extremity  Skin:    General: Skin is warm.  Neurological:     General: No focal deficit present.  Psychiatric:        Mood and Affect: Mood normal.     Data Reviewed:  There are no new results to review at this time. DG Femur Min  2 Views Right CLINICAL DATA:  Fall, right hip fracture.  EXAM: RIGHT FEMUR 2 VIEWS  COMPARISON:  Hip radiograph performed earlier on the same date.  FINDINGS: Displaced subcapital right femoral neck fracture. Distal femur is intact. Tricompartmental knee joint space narrowing. No appreciable knee joint effusion.  IMPRESSION: Displaced subcapital right femoral neck fracture.  Distal femur is intact.  Electronically Signed   By: Larose Hires D.O.   On: 02/09/2023 12:16 CT Head Wo Contrast CLINICAL DATA:  Fall, trauma, possible head injury  EXAM: CT HEAD WITHOUT CONTRAST  TECHNIQUE: Contiguous axial images were obtained from the base of the skull through the vertex without intravenous contrast.  RADIATION DOSE REDUCTION: This exam was performed according to the departmental dose-optimization program which includes automated exposure control, adjustment of the mA and/or kV according to patient size and/or use of iterative reconstruction technique.  COMPARISON:  None Available.  FINDINGS: Brain: Age related atrophy pattern and scattered chronic white matter microvascular ischemic changes throughout both cerebral hemispheres. No acute intracranial hemorrhage, new mass lesion, new infarction, midline shift, herniation, hydrocephalus, or extra-axial fluid collection. No focal mass effect or edema. Cisterns are patent. No cerebellar abnormality.  Vascular: No hyperdense vessel or unexpected calcification.  Skull: Normal. Negative for fracture or focal lesion.  Sinuses/Orbits: No acute finding.  Other: None.  IMPRESSION: 1. Atrophy and chronic white matter microvascular ischemic changes. 2. No acute intracranial abnormality by noncontrast CT.  Electronically Signed   By: Judie Petit.  Shick M.D.   On: 02/09/2023 09:57 DG Chest 1 View CLINICAL DATA:  Fall today.  EXAM: CHEST  1 VIEW  COMPARISON:  November 08, 2016.  FINDINGS: Mild cardiomegaly. No acute pulmonary  disease. Bony thorax is unremarkable.  IMPRESSION: No active disease.  Electronically Signed   By: Lupita Raider M.D.   On: 02/09/2023 09:57 DG Hip Unilat W or Wo Pelvis 2-3 Views Right CLINICAL DATA:  Fall today.  EXAM: DG HIP (WITH OR WITHOUT PELVIS) 2-3V RIGHT  COMPARISON:  None Available.  FINDINGS: Moderately displaced proximal right femoral neck fracture is noted. Left hip is unremarkable.  IMPRESSION: Moderately displaced proximal right femoral neck fracture.  Electronically Signed   By: Lupita Raider M.D.   On: 02/09/2023 09:56     Assessment and Plan: * Hip fracture (HCC) Noted right femoral neck fracture status post mechanical fall Pain control Orthopedic surgery aware of case with plan for operative intervention today N.p.o. Pain control Follow  Fall at home, initial encounter Positive mechanical fall with patient tripping coming outside and landing on the right hip No reported head trauma or loss conscious Noted secondary traumatic right hip fracture Fall precautions  PT OT evaluation as appropriate Follow  Anemia CBC pending  Depression, recurrent (HCC) Continue Prozac and as needed Xanax  Esophagitis, reflux PPI  Chronic obstructive pulmonary disease (HCC) Stable from respiratory standpoint Continue home inhalers as needed.      Advance Care Planning:   Code Status: Full Code   Consults: Orthopedic Surgery per Dr. Modesto Charon   Family Communication: Daughter and granddaughter at the bedside   Severity of Illness: The appropriate patient status for this patient is INPATIENT. Inpatient status is judged to be reasonable and necessary in order to provide the required intensity of service to ensure the patient's safety. The patient's presenting symptoms, physical exam findings, and initial radiographic and laboratory data in the context of their chronic comorbidities is felt to place them at high risk for further clinical deterioration.  Furthermore, it is not anticipated that the patient will be medically stable for discharge from the hospital within 2 midnights of admission.   * I certify that at the point of admission it is my clinical judgment that the patient will require inpatient hospital care spanning beyond 2 midnights from the point of admission due to high intensity of service, high risk for further deterioration and high frequency of surveillance required.*  Author: Floydene Flock, MD 02/09/2023 12:26 PM  For on call review [email or web address].

## 2023-02-09 NOTE — Anesthesia Preprocedure Evaluation (Signed)
Anesthesia Evaluation  Patient identified by MRN, date of birth, ID band Patient awake    Reviewed: Allergy & Precautions, NPO status , Patient's Chart, lab work & pertinent test results  History of Anesthesia Complications Negative for: history of anesthetic complications  Airway Mallampati: II  TM Distance: >3 FB Neck ROM: Full    Dental  (+) Upper Dentures, Lower Dentures Bonded firmly with glue:   Pulmonary asthma , neg sleep apnea, COPD,  COPD inhaler, Patient abstained from smoking.Not current smoker, former smoker Patient denied respiratory diagnoses, but grand daughter confided to me that patient does take an inhaler sometimes.   Pulmonary exam normal breath sounds clear to auscultation       Cardiovascular Exercise Tolerance: Good METShypertension, Pt. on medications (-) CAD and (-) Past MI (-) dysrhythmias  Rhythm:Regular Rate:Normal - Systolic murmurs    Neuro/Psych  PSYCHIATRIC DISORDERS Anxiety Depression    negative neurological ROS     GI/Hepatic ,neg GERD  ,,(+)     (-) substance abuse    Endo/Other  neg diabetes    Renal/GU negative Renal ROS     Musculoskeletal  (+) Arthritis ,    Abdominal   Peds  Hematology Not on blood thinners, has not receive enoxaprin dose here inpatient.   Anesthesia Other Findings Past Medical History: No date: Anxiety No date: Depression  Reproductive/Obstetrics                             Anesthesia Physical Anesthesia Plan  ASA: 3  Anesthesia Plan: Spinal   Post-op Pain Management: Ofirmev IV (intra-op)*   Induction: Intravenous  PONV Risk Score and Plan: 2 and Ondansetron, Dexamethasone, Propofol infusion, TIVA, Treatment may vary due to age or medical condition and Midazolam  Airway Management Planned: Natural Airway  Additional Equipment: None  Intra-op Plan:   Post-operative Plan:   Informed Consent: I have reviewed the  patients History and Physical, chart, labs and discussed the procedure including the risks, benefits and alternatives for the proposed anesthesia with the patient or authorized representative who has indicated his/her understanding and acceptance.       Plan Discussed with: CRNA and Surgeon  Anesthesia Plan Comments: (Discussed R/B/A of neuraxial anesthesia technique with patient and her husband and daughter in law at bedside: - rare risks of spinal/epidural hematoma, nerve damage, infection - Risk of PDPH - Risk of nausea and vomiting - Risk of conversion to general anesthesia and its associated risks, including sore throat, damage to lips/eyes/teeth/oropharynx, and rare risks such as cardiac and respiratory events. - Risk of allergic reactions  Discussed the role of CRNA in patient's perioperative care.  Patient voiced understanding.)        Anesthesia Quick Evaluation

## 2023-02-09 NOTE — ED Notes (Signed)
This RN offered Dr. Tedd Sias to consent pt. At bedside, states he will fill out consent paper upstairs.

## 2023-02-09 NOTE — Assessment & Plan Note (Signed)
PPI ?

## 2023-02-09 NOTE — Assessment & Plan Note (Signed)
Noted right femoral neck fracture status post mechanical fall Pain control Orthopedic surgery aware of case with plan for operative intervention today N.p.o. Pain control Follow

## 2023-02-09 NOTE — ED Triage Notes (Signed)
Pt arrives via ems fentanyl enroute via ems 18g left ac Larey Seat this am coming out of her home, tripped over the doorway Landed on right hip, rotated out and shortened 87p, 96% on 1L via [place], 160/74, hx of anemia, no allergies, hx of htn   Pt denies hitting her head,states that she was coming out of her door and stepped down while closing the door and lost her balance, pt states that she landed on cement.

## 2023-02-09 NOTE — ED Provider Notes (Signed)
[facility] Provider Note    Event Date/Time   First MD Initiated Contact with Patient 02/09/23 [phone]     (approximate)   History   Fall and Hip Pain   HPI  CARMIE DEHART is a 77 y.o. female   Past medical history of depression and anxiety who presents to the emergency department with right hip injury after tripping over the threshold of her front steps and falling onto her right side today.  No head strike or loss of consciousness.  No thinners.  Otherwise has been in regular state of health no acute medical complaints otherwise.  Got 100 of fentanyl by EMS en route.    External Medical Documents Reviewed: Office visit from November 2023 documenting past medical history and medication list.      Physical Exam   Triage Vital Signs: ED Triage Vitals  Enc Vitals Group     BP 02/09/23 0853 136/69     Pulse Rate 02/09/23 0853 82     Resp 02/09/23 0853 16     Temp 02/09/23 0853 97.6 F (36.4 C)     Temp Source 02/09/23 0853 Oral     SpO2 02/09/23 0853 95 %     Weight 02/09/23 0855 140 lb (63.5 kg)     Height 02/09/23 0855 5\' 7"  (1.702 m)     Head Circumference --      Peak Flow --      Pain Score 02/09/23 0854 9     Pain Loc --      Pain Edu? --      Excl. in GC? --     Most recent vital signs: Vitals:   02/09/23 0853  BP: 136/69  Pulse: 82  Resp: 16  Temp: 97.6 F (36.4 C)  SpO2: 95%    General: Awake, no distress.  CV:  Good peripheral perfusion.  Resp:  Normal effort.  Abd:  No distention.  Other:  Unable to range the right hip due to pain, tenderness in that area neurovascular intact no other signs of acute traumatic injuries on full head to toe examination.   ED Results / Procedures / Treatments   Labs (all labs ordered are listed, but only abnormal results are displayed) Labs Reviewed  BASIC METABOLIC PANEL  CBC WITH DIFFERENTIAL/PLATELET     RADIOLOGY I independently reviewed and interpreted right hip x-ray and see a  fracture in the neck   PROCEDURES:  Critical Care performed: No  Procedures   MEDICATIONS ORDERED IN ED: Medications  morphine (PF) 4 MG/ML injection 4 mg (has no administration in time range)  ondansetron (ZOFRAN) injection 4 mg (4 mg Intravenous Given 02/09/23 1027)    External physician / consultants:  I spoke with [facility] of ortho regarding care plan for this patient.   IMPRESSION / MDM / ASSESSMENT AND PLAN / ED COURSE  I reviewed the triage vital signs and the nursing notes.                                Patient's presentation is most consistent with acute presentation with potential threat to life or bodily function.  Differential diagnosis includes, but is not limited to, blunt traumatic injury including hip fracture dislocation, intracranial bleeding   The patient is on the cardiac monitor to evaluate for evidence of arrhythmia and/or significant heart rate changes.  MDM: Right hip fracture discussed with orthopedics plan for OR  later today n.p.o. last night, pain control, basic labs, avoid thinners      FINAL CLINICAL IMPRESSION(S) / ED DIAGNOSES   Final diagnoses:  Closed right hip fracture, initial encounter [facility])     Rx / DC Orders   ED Discharge Orders     None        Note:  This document was prepared using Dragon voice recognition software and may include unintentional dictation errors.    Pilar Jarvis, MD 02/09/23 1030

## 2023-02-09 NOTE — ED Notes (Signed)
Pt. To xray, will call for transport when pt. Returns.

## 2023-02-09 NOTE — ED Notes (Signed)
CT at bedside 

## 2023-02-09 NOTE — Assessment & Plan Note (Signed)
Stable from respiratory standpoint Continue home inhalers as needed.

## 2023-02-09 NOTE — Interval H&P Note (Signed)
Patient history and physical updated. Consent reviewed including risks, benefits, and alternatives to surgery. Patient agrees with above plan to proceed with right hip cemented hemiarthroplasty.   

## 2023-02-09 NOTE — ED Notes (Signed)
Fleet Contras, Diplomatic Services operational officer called for transport request.

## 2023-02-09 NOTE — Transfer of Care (Signed)
Immediate Anesthesia Transfer of Care Note  Patient: Molly Leonard  Procedure(s) Performed: ARTHROPLASTY BIPOLAR HIP (HEMIARTHROPLASTY) (Right: Hip)  Patient Location: PACU  Anesthesia Type:Spinal  Level of Consciousness: awake, alert , and oriented  Airway & Oxygen Therapy: Patient Spontanous Breathing and Patient connected to nasal cannula oxygen  Post-op Assessment: Report given to RN and Post -op Vital signs reviewed and stable  Post vital signs: Reviewed and stable  Last Vitals:  Vitals Value Taken Time  BP 116/57 02/09/23 1816  Temp    Pulse 98 02/09/23 1820  Resp 19 02/09/23 1820  SpO2 96 % 02/09/23 1820  Vitals shown include unvalidated device data.  Last Pain:  Vitals:   02/09/23 1123  TempSrc:   PainSc: 7          Complications: No notable events documented.

## 2023-02-09 NOTE — ED Notes (Signed)
Dr. Tedd Sias, surgery at bedside.

## 2023-02-09 NOTE — Assessment & Plan Note (Signed)
Positive mechanical fall with patient tripping coming outside and landing on the right hip No reported head trauma or loss conscious Noted secondary traumatic right hip fracture Fall precautions PT OT evaluation as appropriate Follow

## 2023-02-09 NOTE — Assessment & Plan Note (Signed)
Continue Prozac and as needed Xanax

## 2023-02-09 NOTE — Assessment & Plan Note (Signed)
CBC pending

## 2023-02-09 NOTE — ED Notes (Signed)
Dr. Newton at bedside. 

## 2023-02-10 ENCOUNTER — Encounter: Payer: Self-pay | Admitting: Orthopedic Surgery

## 2023-02-10 ENCOUNTER — Other Ambulatory Visit: Payer: Self-pay

## 2023-02-10 DIAGNOSIS — J418 Mixed simple and mucopurulent chronic bronchitis: Secondary | ICD-10-CM

## 2023-02-10 DIAGNOSIS — D649 Anemia, unspecified: Secondary | ICD-10-CM

## 2023-02-10 DIAGNOSIS — K2100 Gastro-esophageal reflux disease with esophagitis, without bleeding: Secondary | ICD-10-CM

## 2023-02-10 DIAGNOSIS — F339 Major depressive disorder, recurrent, unspecified: Secondary | ICD-10-CM

## 2023-02-10 LAB — COMPREHENSIVE METABOLIC PANEL
ALT: 18 U/L (ref 0–44)
AST: 31 U/L (ref 15–41)
Albumin: 3.1 g/dL — ABNORMAL LOW (ref 3.5–5.0)
Alkaline Phosphatase: 92 U/L (ref 38–126)
Anion gap: 10 (ref 5–15)
BUN: 29 mg/dL — ABNORMAL HIGH (ref 8–23)
CO2: 20 mmol/L — ABNORMAL LOW (ref 22–32)
Calcium: 8.1 mg/dL — ABNORMAL LOW (ref 8.9–10.3)
Chloride: 105 mmol/L (ref 98–111)
Creatinine, Ser: 1.34 mg/dL — ABNORMAL HIGH (ref 0.44–1.00)
GFR, Estimated: 41 mL/min — ABNORMAL LOW (ref >60)
Glucose, Bld: 129 mg/dL — ABNORMAL HIGH (ref 70–99)
Potassium: 3.6 mmol/L (ref 3.5–5.1)
Sodium: 135 mmol/L (ref 135–145)
Total Bilirubin: 0.5 mg/dL (ref 0.3–1.2)
Total Protein: 6.5 g/dL (ref 6.5–8.1)

## 2023-02-10 LAB — GLUCOSE, CAPILLARY: Glucose-Capillary: 136 mg/dL — ABNORMAL HIGH (ref 70–99)

## 2023-02-10 LAB — CBC
HCT: 27.8 % — ABNORMAL LOW (ref 36.0–46.0)
Hemoglobin: 8.9 g/dL — ABNORMAL LOW (ref 12.0–15.0)
MCH: 32.1 pg (ref 26.0–34.0)
MCHC: 32 g/dL (ref 30.0–36.0)
MCV: 100.4 fL — ABNORMAL HIGH (ref 80.0–100.0)
Platelets: 352 10*3/uL (ref 150–400)
RBC: 2.77 MIL/uL — ABNORMAL LOW (ref 3.87–5.11)
RDW: 12.7 % (ref 11.5–15.5)
WBC: 11.4 10*3/uL — ABNORMAL HIGH (ref 4.0–10.5)
nRBC: 0 % (ref 0.0–0.2)

## 2023-02-10 NOTE — Progress Notes (Signed)
  Progress Note   Patient: Molly Leonard ZOX:[id number] DOB: Jun 15, 1946 DOA: 02/09/2023     1 DOS: the patient was seen and examined on 02/10/2023   Brief hospital course: Molly Leonard is a 77 y.o. female with a past medical history of anxiety, depression, asthma/COPD who presents after mechanical fall coming out of the house this morning tripping on her front step landing on her right hip.  Hip plain films positive for moderately displaced proximal right femoral neck fracture. She had Right hip cemented hemiarthroplasty 02/09/23.    Assessment and Plan: * Hip fracture (HCC) S/p Right hip cemented hemiarthroplasty. Follow Orthopedic recommendations. Pain control. Continue to wean her oxygen. Foley removal with voiding trial. Out of bed to chair, PT  Fall at home, initial encounter Positive mechanical fall with patient tripping coming outside and landing on the right hip No reported head trauma or loss conscious Continue fall precautions PT/ OT evaluation per orthopedic surgery. Out of bed to chair.  Acute on chronic Anemia CBC reviewed- drop in H/H.  Hb around 8.9. no need for transfusion. Continue to monitor H/H.  Depression, recurrent  Continue Prozac and as needed Xanax  Esophagitis, reflux Continue daily PPI  Chronic obstructive pulmonary disease (HCC) No exacerbation.  Continue home inhalers as needed.  DVT prophylaxis -Lovenox daily. PT/ OT evaluation. Encourage incentive spirometry. Nursing supportive care.     Subjective: Patient is seen and examined today morning. She denies pain, eating poor. HR elevated on tele. EKG ordered. RN stated that foley removed this AM waiting for voiding. PT at bedside, getting her out of bed.  Physical Exam: Vitals:   02/10/23 0944 02/10/23 0951 02/10/23 1209 02/10/23 1513  BP:  (!) 105/52 103/63 113/62  Pulse: (!) 114  (!) 101 97  Resp:  17 18 16   Temp:  98.7 F (37.1 C) 98.3 F (36.8 C) 98.7 F (37.1 C)  TempSrc:      SpO2:  94% 94% 98% 97%  Weight:      Height:       Heart- S1, S2 heard, tachycardia noted. Abdomen - distended, nontender, bowel sounds good. Neuro- AAO x3, non focal exam.  Data Reviewed:  CBC, BMP, EKG  Family Communication: Patient agreed with current care plan. Discussed with daughter at bedside who is worried about her high heart rate. Reassured likely reactive post op.   Disposition: Status is: Inpatient Remains inpatient appropriate because: post op need pain control, voiding trial, PT eval  Planned Discharge Destination: Home with Home Health     Code Status: Full Code  Time spent: 45 minutes  Author: Marcelino Duster, MD 02/10/2023 4:33 PM  For on call review [email or web address].

## 2023-02-10 NOTE — TOC Progression Note (Signed)
Transition of Care [facility]) - Progression Note    Patient Details  Name: Molly Leonard MRN: [id number] Date of Birth: [birth year]  Transition of Care [facility]) CM/SW Contact  Marlowe Sax, RN Phone Number: 02/10/2023, 10:05 AM  Clinical Narrative:   Patient had surgery yesterday for Hip fracture, PT and OT to eval, TOC to assist with dc planning and needs,  most likely will need SNF           Expected Discharge Plan: Skilled Nursing Facility Barriers to Discharge: SNF Pending bed offer, Insurance Authorization, Continued Medical Work up  Expected Discharge Plan and Services       Living arrangements for the past 2 months: Single Family Home                                       Social Determinants of Health (SDOH) Interventions SDOH Screenings   Food Insecurity: No Food Insecurity (02/09/2023)  Housing: Low Risk  (02/09/2023)  Transportation Needs: No Transportation Needs (02/09/2023)  Utilities: Not At Risk (02/09/2023)  Alcohol Screen: Low Risk  (07/20/2022)  Depression (PHQ2-9): High Risk (07/20/2022)  Financial Resource Strain: Low Risk  (10/28/2021)  Physical Activity: Unknown (10/28/2021)  Social Connections: Moderately Isolated (10/28/2021)  Stress: No Stress Concern Present (10/28/2021)  Tobacco Use: High Risk (02/09/2023)    Readmission Risk Interventions     No data to display

## 2023-02-10 NOTE — Evaluation (Signed)
Physical Therapy Evaluation Patient Details Name: Molly Leonard MRN: [id number] DOB: [birth year] Today's Date: 02/10/2023  History of Present Illness  CHANNAH BUDZINSKI is a 77 y.o. female with a past medical history of anxiety, depression, asthma/COPD who presents after mechanical fall coming out of the house this morning tripping on her front step landing on her right hip. Patient is s/p R hemiarthroplasty, WBAT   Clinical Impression  Patient received in bed, initial session interrupted by EKG. Returned later in am. Patient required max A for supine to sit. Max assist for sit to stand with RW. Bed slightly elevated. Patient was able to take a few pivoting steps to [facility] with mod/max assist. After using [facility] patient stood with mod A and BSC switched out for recliner. Patient limited by pain and weakness, feeling nauseated. Patient will continue to benefit from skilled PT to improve strength, safety and independence.          Recommendations for follow up therapy are one component of a multi-disciplinary discharge planning process, led by the attending physician.  Recommendations may be updated based on patient status, additional functional criteria and insurance authorization.  Follow Up Recommendations Can patient physically be transported by private vehicle: No     Assistance Recommended at Discharge Frequent or constant Supervision/Assistance  Patient can return home with the following  Two people to help with walking and/or transfers;Two people to help with bathing/dressing/bathroom;Assist for transportation    Equipment Recommendations Other (comment) (TBD next venue)  Recommendations for Other Services       Functional Status Assessment Patient has had a recent decline in their functional status and demonstrates the ability to make significant improvements in function in a reasonable and predictable amount of time.     Precautions / Restrictions Precautions Precautions:  Fall Restrictions Weight Bearing Restrictions: Yes RLE Weight Bearing: Weight bearing as tolerated      Mobility  Bed Mobility Overal bed mobility: Needs Assistance Bed Mobility: Supine to Sit     Supine to sit: Max assist          Transfers Overall transfer level: Needs assistance Equipment used: Rolling walker (2 wheels) Transfers: Sit to/from Stand, Bed to chair/wheelchair/BSC Sit to Stand: Max assist, +2 physical assistance, From elevated surface Stand pivot transfers: Mod assist, Max assist         General transfer comment: patient able to take a few standing pivot steps from bed to [facility]. Required mod to max assist to accomplish.    Ambulation/Gait                  Stairs            Wheelchair Mobility    Modified Rankin (Stroke Patients Only)       Balance Overall balance assessment: Needs assistance Sitting-balance support: Feet supported Sitting balance-Leahy Scale: Fair     Standing balance support: Bilateral upper extremity supported, During functional activity, Reliant on assistive device for balance Standing balance-Leahy Scale: Poor Standing balance comment: Requires mod to max A for standing balance                             Pertinent Vitals/Pain Pain Assessment Pain Assessment: Faces Faces Pain Scale: Hurts whole lot Pain Location: R LE Pain Descriptors / Indicators: Discomfort, Sore, Moaning, Grimacing, Guarding Pain Intervention(s): Monitored during session, Repositioned    Home Living Family/patient expects to be discharged to:: Skilled nursing facility Living Arrangements:  Children;Spouse/significant other                 Additional Comments: 2 adult grandchildren    Prior Function Prior Level of Function : Driving;Independent/Modified Independent             Mobility Comments: not using AD prior to admission ADLs Comments: independent     Hand Dominance        Extremity/Trunk  Assessment   Upper Extremity Assessment Upper Extremity Assessment: Overall WFL for tasks assessed    Lower Extremity Assessment Lower Extremity Assessment: RLE deficits/detail RLE: Unable to fully assess due to pain RLE Coordination: decreased gross motor    Cervical / Trunk Assessment Cervical / Trunk Assessment: Normal  Communication   Communication: No difficulties  Cognition Arousal/Alertness: Awake/alert Behavior During Therapy: WFL for tasks assessed/performed Overall Cognitive Status: Impaired/Different from baseline Area of Impairment: Awareness, Problem solving, Safety/judgement, Following commands                       Following Commands: Follows one step commands inconsistently, Follows one step commands with increased time     Problem Solving: Slow processing, Decreased initiation, Difficulty sequencing, Requires verbal cues, Requires tactile cues          General Comments      Exercises     Assessment/Plan    PT Assessment Patient needs continued PT services  PT Problem List Decreased strength;Decreased range of motion;Decreased activity tolerance;Decreased balance;Decreased mobility;Decreased knowledge of precautions;Decreased knowledge of use of DME;Decreased cognition;Cardiopulmonary status limiting activity;Pain       PT Treatment Interventions DME instruction;Therapeutic exercise;Gait training;Stair training;Functional mobility training;Therapeutic activities;Patient/family education;Neuromuscular re-education;Balance training    PT Goals (Current goals can be found in the Care Plan section)  Acute Rehab PT Goals Patient Stated Goal: to improve PT Goal Formulation: With family Time For Goal Achievement: 02/24/23 Potential to Achieve Goals: Good    Frequency BID     Co-evaluation               AM-PAC PT "6 Clicks" Mobility  Outcome Measure Help needed turning from your back to your side while in a flat bed without using  bedrails?: A Lot Help needed moving from lying on your back to sitting on the side of a flat bed without using bedrails?: A Lot Help needed moving to and from a bed to a chair (including a wheelchair)?: A Lot Help needed standing up from a chair using your arms (e.g., wheelchair or bedside chair)?: A Lot Help needed to walk in hospital room?: Total Help needed climbing 3-5 steps with a railing? : Total 6 Click Score: 10    End of Session Equipment Utilized During Treatment: Gait belt;Oxygen Activity Tolerance: Patient limited by pain Patient left: in chair;with call bell/phone within reach;with family/visitor present Nurse Communication: Mobility status PT Visit Diagnosis: Muscle weakness (generalized) (M62.81);Other abnormalities of gait and mobility (R26.89);Pain;Difficulty in walking, not elsewhere classified (R26.2);Unsteadiness on feet (R26.81);History of falling (Z91.81) Pain - Right/Left: Right Pain - part of body: Hip;Leg    Time: 0945-1000 (+[id number]) PT Time Calculation (min) (ACUTE ONLY): 15 min   Charges:   PT Evaluation $PT Eval Moderate Complexity: 1 Mod PT Treatments $Therapeutic Activity: 8-22 mins        Laurelin Elson, PT, GCS 02/10/23,12:30 PM

## 2023-02-10 NOTE — Progress Notes (Signed)
Physical Therapy Treatment Patient Details Name: Molly Leonard MRN: [id number] DOB: May 19, 1946 Today's Date: 02/10/2023   History of Present Illness Molly Leonard is a 77 y.o. female with a past medical history of anxiety, depression, asthma/COPD who presents after mechanical fall coming out of the house this morning tripping on her front step landing on her right hip. Patient is s/p R hemiarthroplasty, WBAT    PT Comments    Patient received up in recliner, she is ready to get back into bed. Patient requires max cues for mobility, pain limited. She is able to stand from recliner with mod +2 assist. She was able to take a few steps from chair to bed using RW and mod +2 assist. Patient is limited by pain and nausea with mobility. No actual vomiting. Patient will continue to benefit from skilled PT while here to improve functional independence and safety with mobility.      Recommendations for follow up therapy are one component of a multi-disciplinary discharge planning process, led by the attending physician.  Recommendations may be updated based on patient status, additional functional criteria and insurance authorization.  Follow Up Recommendations  Can patient physically be transported by private vehicle: No    Assistance Recommended at Discharge Frequent or constant Supervision/Assistance  Patient can return home with the following Two people to help with walking and/or transfers;Two people to help with bathing/dressing/bathroom;Assist for transportation   Equipment Recommendations  Other (comment) (TBD)    Recommendations for Other Services       Precautions / Restrictions Precautions Precautions: Fall Restrictions Weight Bearing Restrictions: Yes RLE Weight Bearing: Weight bearing as tolerated     Mobility  Bed Mobility Overal bed mobility: Needs Assistance Bed Mobility: Sit to Supine     Supine to sit: Max assist Sit to supine: Max assist, +2 for physical assistance         Transfers Overall transfer level: Needs assistance Equipment used: Rolling walker (2 wheels) Transfers: Sit to/from Stand, Bed to chair/wheelchair/BSC Sit to Stand: Mod assist, +2 physical assistance Stand pivot transfers: Mod assist, Max assist Step pivot transfers: Mod assist, +2 physical assistance, +2 safety/equipment       General transfer comment: Patient with improved ability to step with RW this afternoon. Pain limited.    Ambulation/Gait Ambulation/Gait assistance: Mod assist, +2 physical assistance, +2 safety/equipment Gait Distance (Feet): 3 Feet Assistive device: Rolling walker (2 wheels) Gait Pattern/deviations: Step-to pattern, Decreased step length - right, Decreased step length - left, Decreased stride length, Decreased weight shift to right Gait velocity: decr     General Gait Details: patient with improved ability to take steps this pm. Pain limited, requires assistance and cues for sequencing and use of AD   Stairs             Wheelchair Mobility    Modified Rankin (Stroke Patients Only)       Balance Overall balance assessment: Needs assistance Sitting-balance support: Feet supported Sitting balance-Leahy Scale: Fair     Standing balance support: Bilateral upper extremity supported, During functional activity, Reliant on assistive device for balance Standing balance-Leahy Scale: Poor Standing balance comment: mod +2 for standing activities                            Cognition Arousal/Alertness: Awake/alert Behavior During Therapy: WFL for tasks assessed/performed Overall Cognitive Status: Impaired/Different from baseline Area of Impairment: Awareness, Problem solving, Safety/judgement, Following commands  Following Commands: Follows one step commands inconsistently, Follows one step commands with increased time     Problem Solving: Slow processing, Decreased initiation, Difficulty  sequencing, Requires verbal cues, Requires tactile cues          Exercises Total Joint Exercises Ankle Circles/Pumps: AAROM, Both, 10 reps    General Comments        Pertinent Vitals/Pain Pain Assessment Pain Assessment: Faces Faces Pain Scale: Hurts whole lot Pain Location: R LE Pain Descriptors / Indicators: Discomfort, Sore, Moaning, Grimacing, Guarding Pain Intervention(s): Monitored during session, Repositioned, Premedicated before session, Ice applied    Home Living Family/patient expects to be discharged to:: Skilled nursing facility Living Arrangements: Children;Spouse/significant other                 Additional Comments: 2 adult grandchildren    Prior Function            PT Goals (current goals can now be found in the care plan section) Acute Rehab PT Goals Patient Stated Goal: to improve PT Goal Formulation: With patient/family Time For Goal Achievement: 02/24/23 Potential to Achieve Goals: Good Progress towards PT goals: Progressing toward goals    Frequency    BID      PT Plan Current plan remains appropriate    Co-evaluation              AM-PAC PT "6 Clicks" Mobility   Outcome Measure  Help needed turning from your back to your side while in a flat bed without using bedrails?: A Lot Help needed moving from lying on your back to sitting on the side of a flat bed without using bedrails?: A Lot Help needed moving to and from a bed to a chair (including a wheelchair)?: A Lot Help needed standing up from a chair using your arms (e.g., wheelchair or bedside chair)?: A Lot Help needed to walk in hospital room?: Total Help needed climbing 3-5 steps with a railing? : Total 6 Click Score: 10    End of Session Equipment Utilized During Treatment: Gait belt;Oxygen Activity Tolerance: Patient limited by pain;Other (comment) (nausea) Patient left: in bed;with call bell/phone within reach;with bed alarm set;with family/visitor  present Nurse Communication: Mobility status PT Visit Diagnosis: Muscle weakness (generalized) (M62.81);Other abnormalities of gait and mobility (R26.89);Pain;Difficulty in walking, not elsewhere classified (R26.2);Unsteadiness on feet (R26.81);History of falling (Z91.81) Pain - Right/Left: Right Pain - part of body: Hip;Leg     Time: 1258-1316 PT Time Calculation (min) (ACUTE ONLY): 18 min  Charges:  $Therapeutic Activity: 8-22 mins                     Brendin Situ, PT, GCS 02/10/23,2:22 PM

## 2023-02-10 NOTE — Anesthesia Postprocedure Evaluation (Signed)
Anesthesia Post Note  Patient: Molly Leonard  Procedure(s) Performed: ARTHROPLASTY BIPOLAR HIP (HEMIARTHROPLASTY) (Right: Hip)  Patient location during evaluation: Nursing Unit Anesthesia Type: Spinal Level of consciousness: oriented and awake and alert Pain management: pain level controlled Vital Signs Assessment: post-procedure vital signs reviewed and stable Respiratory status: spontaneous breathing and respiratory function stable Cardiovascular status: blood pressure returned to baseline and stable Postop Assessment: no headache, no backache, no apparent nausea or vomiting and patient able to bend at knees Anesthetic complications: no  No notable events documented.   Last Vitals:  Vitals:   02/09/23 1940 02/10/23 0501  BP: (!) 115/44 (!) 121/56  Pulse: 96 (!) 103  Resp: 18 18  Temp: 36.8 C 36.8 C  SpO2: 92% 93%    Last Pain:  Vitals:   02/10/23 0500  TempSrc:   PainSc: 5                  Wael Maestas B Alonza Smoker

## 2023-02-10 NOTE — Progress Notes (Addendum)
   Subjective: 1 Day Post-Op Procedure(s) (LRB): ARTHROPLASTY BIPOLAR HIP (HEMIARTHROPLASTY) (Right) Patient reports pain as mild.   Patient is well, and has had no acute complaints or problems. Family at bedside with no concerns. Denies any CP, SOB, ABD pain.   Objective: Vital signs in last 24 hours: Temp:  [97.4 F (36.3 C)-98.6 F (37 C)] 98.3 F (36.8 C) (05/29 0501) Pulse Rate:  [75-112] 103 (05/29 0501) Resp:  [11-27] 18 (05/29 0501) BP: (106-140)/(44-78) 121/56 (05/29 0501) SpO2:  [82 %-100 %] 93 % (05/29 0501) Weight:  [63.5 kg] 63.5 kg (05/28 0855)  Intake/Output from previous day: 05/28 0701 - 05/29 0700 In: 2685.7 [I.V.:2165.7; IV Piggyback:520] Out: 410 [Urine:260; Blood:150] Intake/Output this shift: No intake/output data recorded.  Recent Labs    02/09/23 0858  HGB 10.3*   Recent Labs    02/09/23 0858  WBC 10.8*  RBC 3.19*  HCT 32.5*  PLT 465*   Recent Labs    02/09/23 0858 02/10/23 0722  NA 137 135  K 3.2* 3.6  CL 103 105  CO2 24 20*  BUN 32* 29*  CREATININE 1.30* 1.34*  GLUCOSE 151* 129*  CALCIUM 9.1 8.1*   No results for input(s): "LABPT", "INR" in the last 72 hours.  EXAM General - Patient is Alert, Appropriate, and Oriented Extremity - Neurovascular intact Sensation intact distally Intact pulses distally Dorsiflexion/Plantar flexion intact Dressing - dressing C/D/I and no drainage Motor Function - intact, moving foot and toes well on exam.   Past Medical History:  Diagnosis Date   Anxiety    Depression     Assessment/Plan:   1 Day Post-Op Procedure(s) (LRB): ARTHROPLASTY BIPOLAR HIP (HEMIARTHROPLASTY) (Right) Principal Problem:   Hip fracture (HCC) Active Problems:   Chronic obstructive pulmonary disease (HCC)   Esophagitis, reflux   Depression, recurrent (HCC)   Anemia   Fall at home, initial encounter   Displaced fracture of right femoral neck (HCC)  Estimated body mass index is 21.93 kg/m as calculated from  the following:   Height as of this encounter: 5\' 7"  (1.702 m).   Weight as of this encounter: 63.5 kg. Advance diet Up with therapy Pain well controlled Vital signs stable Labs stable Hgb 10.3, recheck Hgb tomorrow. CM to assist with discharge, most likely will need SNF   Follow up with [facility] in 2 weeks Lovenox 40 mg SQ daily x 14 days at discharge   DVT Prophylaxis - Lovenox, TED hose, and SCDs Weight-Bearing as tolerated to right leg   T. Cranston Neighbor, PA-C [facility] 02/10/2023, 7:58 AM   Patient seen and examined, agree with above plan.  The patient is doing well status post right hip hemiarthroplasty, no concerns at this time.  Pain is controlled.  Discussed DVT prophylaxis, pain medication use, and safe transition to home.  All questions answered the patient agrees with above plan will hopefully go home after clears PT.   Reinaldo Berber MD

## 2023-02-11 LAB — BASIC METABOLIC PANEL
Anion gap: 9 (ref 5–15)
BUN: 34 mg/dL — ABNORMAL HIGH (ref 8–23)
CO2: 21 mmol/L — ABNORMAL LOW (ref 22–32)
Calcium: 8.1 mg/dL — ABNORMAL LOW (ref 8.9–10.3)
Chloride: 107 mmol/L (ref 98–111)
Creatinine, Ser: 1.27 mg/dL — ABNORMAL HIGH (ref 0.44–1.00)
GFR, Estimated: 44 mL/min — ABNORMAL LOW (ref >60)
Glucose, Bld: 138 mg/dL — ABNORMAL HIGH (ref 70–99)
Potassium: 3.4 mmol/L — ABNORMAL LOW (ref 3.5–5.1)
Sodium: 137 mmol/L (ref 135–145)

## 2023-02-11 LAB — CBC
HCT: 28.6 % — ABNORMAL LOW (ref 36.0–46.0)
Hemoglobin: 9.2 g/dL — ABNORMAL LOW (ref 12.0–15.0)
MCH: 32.5 pg (ref 26.0–34.0)
MCHC: 32.2 g/dL (ref 30.0–36.0)
MCV: 101.1 fL — ABNORMAL HIGH (ref 80.0–100.0)
Platelets: 312 10*3/uL (ref 150–400)
RBC: 2.83 MIL/uL — ABNORMAL LOW (ref 3.87–5.11)
RDW: 12.8 % (ref 11.5–15.5)
WBC: 13.3 10*3/uL — ABNORMAL HIGH (ref 4.0–10.5)
nRBC: 0 % (ref 0.0–0.2)

## 2023-02-11 NOTE — Progress Notes (Signed)
   Subjective: 2 Days Post-Op Procedure(s) (LRB): ARTHROPLASTY BIPOLAR HIP (HEMIARTHROPLASTY) (Right) Patient reports pain as mild.   Patient is well, and has had no acute complaints or problems. Denies any CP, SOB, ABD pain.   Objective: Vital signs in last 24 hours: Temp:  [98.2 F (36.8 C)-100 F (37.8 C)] 99 F (37.2 C) (05/30 0614) Pulse Rate:  [97-114] 110 (05/30 0614) Resp:  [16-18] 18 (05/30 0614) BP: (103-138)/(52-66) 132/61 (05/30 0614) SpO2:  [94 %-98 %] 94 % (05/30 0614)  Intake/Output from previous day: 05/29 0701 - 05/30 0700 In: 1603.2 [I.V.:1603.2] Out: -  Intake/Output this shift: No intake/output data recorded.  Recent Labs    02/09/23 0858 02/10/23 0722 02/11/23 0718  HGB 10.3* 8.9* 9.2*   Recent Labs    02/10/23 0722 02/11/23 0718  WBC 11.4* 13.3*  RBC 2.77* 2.83*  HCT 27.8* 28.6*  PLT 352 312   Recent Labs    02/10/23 0722 02/11/23 0718  NA 135 137  K 3.6 3.4*  CL 105 107  CO2 20* 21*  BUN 29* 34*  CREATININE 1.34* 1.27*  GLUCOSE 129* 138*  CALCIUM 8.1* 8.1*   No results for input(s): "LABPT", "INR" in the last 72 hours.  EXAM General - Patient is Alert, Appropriate, and Oriented Extremity - Neurovascular intact Sensation intact distally Intact pulses distally Dorsiflexion/Plantar flexion intact Dressing - dressing C/D/I and no drainage Motor Function - intact, moving foot and toes well on exam.   Past Medical History:  Diagnosis Date   Anxiety    Depression     Assessment/Plan:   2 Days Post-Op Procedure(s) (LRB): ARTHROPLASTY BIPOLAR HIP (HEMIARTHROPLASTY) (Right) Principal Problem:   Closed right hip fracture, initial encounter [facility]) Active Problems:   Chronic obstructive pulmonary disease (HCC)   Esophagitis, reflux   Depression, recurrent (HCC)   Acute on chronic anemia   Fall at home, initial encounter   Displaced fracture of right femoral neck (HCC)  Estimated body mass index is 21.93 kg/m as calculated  from the following:   Height as of this encounter: 5\' 7"  (1.702 m).   Weight as of this encounter: 63.5 kg. Advance diet Up with therapy Pain well controlled Vital signs stable Labs stable Hgb 9.2, stable  trending up. CM to assist with discharge, slow progress with PT. Most likely will need SNF   Follow up with [facility] in 2 weeks Lovenox 40 mg SQ daily x 14 days at discharge   DVT Prophylaxis - Lovenox, TED hose, and SCDs Weight-Bearing as tolerated to right leg   T. Cranston Neighbor, PA-C [facility] 02/11/2023, 9:39 AM

## 2023-02-11 NOTE — Progress Notes (Signed)
  Progress Note   Patient: Molly Leonard ZOX:[id number] DOB: Jan 20, 1946 DOA: 02/09/2023     2 DOS: the patient was seen and examined on 02/11/2023   Brief hospital course: CAPRINA OUTTEN is a 77 y.o. female with a past medical history of anxiety, depression, asthma/COPD who presents after mechanical fall coming out of the house this morning tripping on her front step landing on her right hip.  Hip plain films positive for moderately displaced proximal right femoral neck fracture. She had Right hip cemented hemiarthroplasty 02/09/23. Got out of bed to chair, foley removed, urine output poor.  Assessment and Plan: * Hip fracture (HCC) S/p Right hip cemented hemiarthroplasty. Follow Orthopedic recommendations. Pain control. She is off oxygen. Foley removed, monitor urine output. Out of bed to chair, WBAT, incentive spirometry.  Fall at home, initial encounter Positive mechanical fall with patient tripping coming outside and landing on the right hip Continue fall precautions PT/ OT follow up, . Out of bed to chair.  Acute on chronic Anemia CBC reviewed- drop in H/H.  Hb around 8.9. no need for transfusion. Continue to monitor H/H.  Depression, recurrent  Continue Prozac and as needed Xanax  Esophagitis, reflux Continue daily PPI  Chronic obstructive pulmonary disease (HCC) No exacerbation.  Continue home inhalers as needed.  DVT prophylaxis -Lovenox daily x 14 days post op. Nursing supportive care.     Subjective: Patient is seen and examined today morning. She is eating poor. HR improved. She is out of bed to chair. Grand daughter at bedside. Had poor urine output.  Physical Exam: Vitals:   02/11/23 0414 02/11/23 0614 02/11/23 1059 02/11/23 1507  BP:  132/61  (!) 128/58  Pulse:  (!) 110  (!) 103  Resp:  18  16  Temp: 98.2 F (36.8 C) 99 F (37.2 C)  98.4 F (36.9 C)  TempSrc: Oral     SpO2:  94% (!) 85% 96%  Weight:      Height:       Heart- S1, S2 heard, tachycardia  noted. Lungs bilateral air entry good,, bibasal rales. Abdomen - distended, nontender, bowel sounds good. Neuro- AAO x3, non focal exam.  Data Reviewed:  CBC, BMP  Family Communication: Patient and grand daughter at bedside agreed with current care plan.   Disposition: Status is: Inpatient Remains inpatient appropriate because: post op need pain control, voiding trial, PT eval  Planned Discharge Destination: Home with Home Health     Code Status: Full Code  Time spent: 43 minutes  Author: Marcelino Duster, MD 02/11/2023 3:08 PM  For on call review [email or web address].

## 2023-02-11 NOTE — Progress Notes (Signed)
Physical Therapy Treatment Patient Details Name: Molly Leonard MRN: [id number] DOB: 01/15/1946 Today's Date: 02/11/2023   History of Present Illness Molly Leonard is a 77 y.o. female with a past medical history of anxiety, depression, asthma/COPD who presents after mechanical fall coming out of the house this morning tripping on her front step landing on her right hip. Patient is s/p R hemiarthroplasty, WBAT    PT Comments    Patient received in bed, she is slightly confused this morning, difficulty following direction. She required max assist to perform supine to sit. Mod A +1-2 for sit to stand from elevated bed. Patient was able to ambulated ~12 feet with RW and min guard with chair following. She is limited by pain and reports "I am going to be sick".  Patient does not actually get sick. She will continue to benefit from skilled PT to improve functional independence, strength and safety with mobility.     Recommendations for follow up therapy are one component of a multi-disciplinary discharge planning process, led by the attending physician.  Recommendations may be updated based on patient status, additional functional criteria and insurance authorization.  Follow Up Recommendations  Can patient physically be transported by private vehicle: No    Assistance Recommended at Discharge Frequent or constant Supervision/Assistance  Patient can return home with the following A lot of help with walking and/or transfers;A lot of help with bathing/dressing/bathroom   Equipment Recommendations  Rolling walker (2 wheels)    Recommendations for Other Services       Precautions / Restrictions Precautions Precautions: Fall Restrictions Weight Bearing Restrictions: Yes RLE Weight Bearing: Weight bearing as tolerated     Mobility  Bed Mobility Overal bed mobility: Needs Assistance Bed Mobility: Supine to Sit     Supine to sit: Max assist          Transfers Overall transfer level: Needs  assistance Equipment used: Rolling walker (2 wheels) Transfers: Sit to/from Stand Sit to Stand: Mod assist, +2 physical assistance, From elevated surface                Ambulation/Gait Ambulation/Gait assistance: Min guard Gait Distance (Feet): 12 Feet Assistive device: Rolling walker (2 wheels) Gait Pattern/deviations: Antalgic, Decreased step length - right, Decreased step length - left, Decreased weight shift to right Gait velocity: decr     General Gait Details: patient anxious throughout session. Moaning. She was able to progress with mobility and chair follow this am. Distance limited by nausea/pain.   Stairs             Wheelchair Mobility    Modified Rankin (Stroke Patients Only)       Balance Overall balance assessment: Needs assistance Sitting-balance support: Feet supported Sitting balance-Leahy Scale: Fair     Standing balance support: Bilateral upper extremity supported, During functional activity, Reliant on assistive device for balance Standing balance-Leahy Scale: Fair Standing balance comment: min A with RW                            Cognition Arousal/Alertness: Awake/alert Behavior During Therapy: WFL for tasks assessed/performed Overall Cognitive Status: Impaired/Different from baseline Area of Impairment: Awareness, Problem solving, Safety/judgement, Following commands                       Following Commands: Follows one step commands inconsistently, Follows one step commands with increased time     Problem Solving: Slow processing, Decreased  initiation, Difficulty sequencing, Requires verbal cues, Requires tactile cues          Exercises Total Joint Exercises Ankle Circles/Pumps: AROM, 10 reps, Both    General Comments        Pertinent Vitals/Pain Pain Assessment Pain Assessment: Faces Faces Pain Scale: Hurts even more Pain Location: R LE Pain Descriptors / Indicators: Discomfort, Sore, Moaning,  Grimacing, Guarding Pain Intervention(s): Monitored during session, Repositioned    Home Living                          Prior Function            PT Goals (current goals can now be found in the care plan section) Acute Rehab PT Goals Patient Stated Goal: to improve PT Goal Formulation: With patient/family Time For Goal Achievement: 02/24/23 Potential to Achieve Goals: Good Progress towards PT goals: Progressing toward goals    Frequency    BID      PT Plan Current plan remains appropriate    Co-evaluation              AM-PAC PT "6 Clicks" Mobility   Outcome Measure  Help needed turning from your back to your side while in a flat bed without using bedrails?: A Lot Help needed moving from lying on your back to sitting on the side of a flat bed without using bedrails?: A Lot Help needed moving to and from a bed to a chair (including a wheelchair)?: A Little Help needed standing up from a chair using your arms (e.g., wheelchair or bedside chair)?: A Lot Help needed to walk in hospital room?: A Little Help needed climbing 3-5 steps with a railing? : Total 6 Click Score: 13    End of Session Equipment Utilized During Treatment: Gait belt;Oxygen Activity Tolerance: Patient limited by pain;Other (comment);Patient limited by fatigue Patient left: with call bell/phone within reach;in chair;with chair alarm set Nurse Communication: Mobility status PT Visit Diagnosis: Muscle weakness (generalized) (M62.81);Other abnormalities of gait and mobility (R26.89);Pain;Difficulty in walking, not elsewhere classified (R26.2);Unsteadiness on feet (R26.81);History of falling (Z91.81) Pain - Right/Left: Right Pain - part of body: Hip     Time: 1610-9604 PT Time Calculation (min) (ACUTE ONLY): 27 min  Charges:  $Gait Training: 8-22 mins $Therapeutic Activity: 8-22 mins                     Molly Leonard, PT, GCS 02/11/23,11:06 AM

## 2023-02-11 NOTE — Progress Notes (Signed)
Physical Therapy Treatment Patient Details Name: Molly Leonard MRN: [id number] DOB: 1946/05/07 Today's Date: 02/11/2023   History of Present Illness Molly Leonard is a 77 y.o. female with a past medical history of anxiety, depression, asthma/COPD who presents after mechanical fall coming out of the house this morning tripping on her front step landing on her right hip. Patient is s/p R hemiarthroplasty, WBAT    PT Comments    Patient received up in recliner, grand daughters at bedside. She is reluctant to participate but with encouragement she agrees. Family state patient did well getting up to [facility] earlier. She requires cues for scooting forward to edge of chair in preparation for standing. Requires multiple cues for hand placement and technique to stand. Cues for upright posture once standing. She is able to ambulate 25 feet with RW and min A. She is limited by anxiety. Patient will continue to benefit from skilled PT to improve functional independence and safety.        Recommendations for follow up therapy are one component of a multi-disciplinary discharge planning process, led by the attending physician.  Recommendations may be updated based on patient status, additional functional criteria and insurance authorization.  Follow Up Recommendations  Can patient physically be transported by private vehicle: No    Assistance Recommended at Discharge Frequent or constant Supervision/Assistance  Patient can return home with the following A lot of help with bathing/dressing/bathroom;A lot of help with walking and/or transfers   Equipment Recommendations  Rolling walker (2 wheels)    Recommendations for Other Services       Precautions / Restrictions Precautions Precautions: Fall Restrictions Weight Bearing Restrictions: Yes RLE Weight Bearing: Weight bearing as tolerated     Mobility  Bed Mobility Overal bed mobility: Needs Assistance Bed Mobility: Supine to Sit     Supine to sit:  Max assist     General bed mobility comments: NT, patient received in recliner and returned to recliner    Transfers Overall transfer level: Needs assistance Equipment used: Rolling walker (2 wheels) Transfers: Sit to/from Stand Sit to Stand: Mod assist                Ambulation/Gait Ambulation/Gait assistance: Min assist Gait Distance (Feet): 25 Feet Assistive device: Rolling walker (2 wheels) Gait Pattern/deviations: Step-through pattern, Decreased step length - right, Decreased step length - left, Decreased stride length Gait velocity: decr     General Gait Details: Anxious throughout. Requires encouragement. Will state " I'm sick", but does not actually get sick. Improving.   Stairs             Wheelchair Mobility    Modified Rankin (Stroke Patients Only)       Balance Overall balance assessment: Needs assistance Sitting-balance support: Feet supported Sitting balance-Leahy Scale: Fair     Standing balance support: Bilateral upper extremity supported, During functional activity, Reliant on assistive device for balance Standing balance-Leahy Scale: Fair Standing balance comment: min A with RW                            Cognition Arousal/Alertness: Awake/alert Behavior During Therapy: WFL for tasks assessed/performed Overall Cognitive Status: Impaired/Different from baseline Area of Impairment: Awareness, Problem solving, Safety/judgement, Following commands                       Following Commands: Follows one step commands inconsistently, Follows one step commands with increased time  Problem Solving: Slow processing, Decreased initiation, Difficulty sequencing, Requires verbal cues, Requires tactile cues General Comments: occasionally not making sense when she is talking. Some mild confusion.        Exercises Total Joint Exercises Ankle Circles/Pumps: AROM, 10 reps, Both    General Comments        Pertinent  Vitals/Pain Pain Assessment Pain Assessment: Faces Faces Pain Scale: Hurts even more Pain Location: R LE Pain Descriptors / Indicators: Discomfort, Sore, Moaning, Grimacing, Guarding Pain Intervention(s): Monitored during session, Repositioned    Home Living                          Prior Function            PT Goals (current goals can now be found in the care plan section) Acute Rehab PT Goals Patient Stated Goal: to improve, go home PT Goal Formulation: With patient Time For Goal Achievement: 02/24/23 Potential to Achieve Goals: Good Progress towards PT goals: Progressing toward goals    Frequency    BID      PT Plan Current plan remains appropriate    Co-evaluation              AM-PAC PT "6 Clicks" Mobility   Outcome Measure  Help needed turning from your back to your side while in a flat bed without using bedrails?: A Lot Help needed moving from lying on your back to sitting on the side of a flat bed without using bedrails?: A Lot Help needed moving to and from a bed to a chair (including a wheelchair)?: A Little Help needed standing up from a chair using your arms (e.g., wheelchair or bedside chair)?: A Lot Help needed to walk in hospital room?: A Little Help needed climbing 3-5 steps with a railing? : Total 6 Click Score: 13    End of Session Equipment Utilized During Treatment: Gait belt;Oxygen Activity Tolerance: Patient limited by fatigue;Patient limited by pain Patient left: in chair;with call bell/phone within reach;with chair alarm set;with family/visitor present Nurse Communication: Mobility status PT Visit Diagnosis: Muscle weakness (generalized) (M62.81);Other abnormalities of gait and mobility (R26.89);Pain;Difficulty in walking, not elsewhere classified (R26.2);Unsteadiness on feet (R26.81);History of falling (Z91.81) Pain - Right/Left: Right Pain - part of body: Hip     Time: 1610-9604 PT Time Calculation (min) (ACUTE ONLY):  16 min  Charges:  $Gait Training: 8-22 mins $Therapeutic Activity: 8-22 mins                     Molly Leonard, PT, GCS 02/11/23,2:32 PM

## 2023-02-12 LAB — CBC
HCT: 27.2 % — ABNORMAL LOW (ref 36.0–46.0)
Hemoglobin: 8.8 g/dL — ABNORMAL LOW (ref 12.0–15.0)
MCH: 32.1 pg (ref 26.0–34.0)
MCHC: 32.4 g/dL (ref 30.0–36.0)
MCV: 99.3 fL (ref 80.0–100.0)
Platelets: 294 10*3/uL (ref 150–400)
RBC: 2.74 MIL/uL — ABNORMAL LOW (ref 3.87–5.11)
RDW: 13.1 % (ref 11.5–15.5)
WBC: 11.4 10*3/uL — ABNORMAL HIGH (ref 4.0–10.5)
nRBC: 0 % (ref 0.0–0.2)

## 2023-02-12 LAB — BASIC METABOLIC PANEL
Anion gap: 11 (ref 5–15)
BUN: 35 mg/dL — ABNORMAL HIGH (ref 8–23)
CO2: 20 mmol/L — ABNORMAL LOW (ref 22–32)
Calcium: 8.4 mg/dL — ABNORMAL LOW (ref 8.9–10.3)
Chloride: 105 mmol/L (ref 98–111)
Creatinine, Ser: 1.06 mg/dL — ABNORMAL HIGH (ref 0.44–1.00)
GFR, Estimated: 54 mL/min — ABNORMAL LOW (ref >60)
Glucose, Bld: 123 mg/dL — ABNORMAL HIGH (ref 70–99)
Potassium: 3.3 mmol/L — ABNORMAL LOW (ref 3.5–5.1)
Sodium: 136 mmol/L (ref 135–145)

## 2023-02-12 MED ORDER — SENNA 8.6 MG PO TABS
2.0000 | ORAL_TABLET | Freq: Every day | ORAL | Status: DC
  Administered 2023-02-12 – 2023-02-17 (×5): 17.2 mg via ORAL
  Filled 2023-02-12 (×6): qty 2

## 2023-02-12 MED ORDER — POTASSIUM CHLORIDE 20 MEQ PO PACK
40.0000 meq | PACK | Freq: Every day | ORAL | Status: DC
  Administered 2023-02-12 – 2023-02-14 (×3): 40 meq via ORAL
  Filled 2023-02-12 (×3): qty 2

## 2023-02-12 MED ORDER — DOCUSATE SODIUM 100 MG PO CAPS
200.0000 mg | ORAL_CAPSULE | Freq: Two times a day (BID) | ORAL | Status: DC
  Administered 2023-02-12 – 2023-02-18 (×12): 200 mg via ORAL
  Filled 2023-02-12 (×13): qty 2

## 2023-02-12 MED ORDER — POLYETHYLENE GLYCOL 3350 17 G PO PACK
17.0000 g | PACK | Freq: Every day | ORAL | Status: DC
  Administered 2023-02-12 – 2023-02-16 (×5): 17 g via ORAL
  Filled 2023-02-12 (×7): qty 1

## 2023-02-12 NOTE — Progress Notes (Addendum)
   Subjective: 3 Days Post-Op Procedure(s) (LRB): ARTHROPLASTY BIPOLAR HIP (HEMIARTHROPLASTY) (Right) Patient reports pain as mild.   Patient is well, and has had no acute complaints or problems. Denies any CP, SOB, ABD pain.   Objective: Vital signs in last 24 hours: Temp:  [98.3 F (36.8 C)-98.4 F (36.9 C)] 98.3 F (36.8 C) (05/30 2322) Pulse Rate:  [103-109] 109 (05/30 2322) Resp:  [16-18] 18 (05/30 2322) BP: (128-138)/(58-66) 138/66 (05/30 2322) SpO2:  [85 %-96 %] 94 % (05/30 2322)  Intake/Output from previous day: 05/30 0701 - 05/31 0700 In: 201.7 [P.O.:200; I.V.:1.7] Out: -  Intake/Output this shift: No intake/output data recorded.  Recent Labs    02/09/23 0858 02/10/23 0722 02/11/23 0718 02/12/23 0408  HGB 10.3* 8.9* 9.2* 8.8*   Recent Labs    02/11/23 0718 02/12/23 0408  WBC 13.3* 11.4*  RBC 2.83* 2.74*  HCT 28.6* 27.2*  PLT 312 294   Recent Labs    02/11/23 0718 02/12/23 0408  NA 137 136  K 3.4* 3.3*  CL 107 105  CO2 21* 20*  BUN 34* 35*  CREATININE 1.27* 1.06*  GLUCOSE 138* 123*  CALCIUM 8.1* 8.4*   No results for input(s): "LABPT", "INR" in the last 72 hours.  EXAM General - Patient is Alert, Appropriate, and Oriented Extremity - Neurovascular intact Sensation intact distally Intact pulses distally Dorsiflexion/Plantar flexion intact Dressing - dressing C/D/I and no drainage Motor Function - intact, moving foot and toes well on exam.   Past Medical History:  Diagnosis Date   Anxiety    Depression     Assessment/Plan:   3 Days Post-Op Procedure(s) (LRB): ARTHROPLASTY BIPOLAR HIP (HEMIARTHROPLASTY) (Right) Principal Problem:   Closed right hip fracture, initial encounter [facility]) Active Problems:   Chronic obstructive pulmonary disease (HCC)   Esophagitis, reflux   Depression, recurrent (HCC)   Acute on chronic anemia   Fall at home, initial encounter   Displaced fracture of right femoral neck (HCC)  Estimated body mass  index is 21.93 kg/m as calculated from the following:   Height as of this encounter: 5\' 7"  (1.702 m).   Weight as of this encounter: 63.5 kg. Advance diet Up with therapy Pain well controlled Vital signs stable Labs stable Hgb 8.8, stable  CM to assist with discharge, slow progress with PT. Will need SNF   Follow up with [facility] in 2 weeks Lovenox 40 mg SQ daily x 14 days at discharge   DVT Prophylaxis - Lovenox, TED hose, and SCDs Weight-Bearing as tolerated to right leg   T. Cranston Neighbor, PA-C [facility] 02/12/2023, 8:13 AM   Patient seen and examined, agree with above plan.  The patient is doing well status post right hip hemiarthroplasty, no concerns at this time.  Pain is controlled.  Discussed DVT prophylaxis, pain medication use, and safe transition out of the hospital.  All questions answered the patient agrees with above plan will likely need some rehab after discharge.  Reinaldo Berber MD

## 2023-02-12 NOTE — Progress Notes (Addendum)
  Progress Note   Patient: Molly Leonard WGN:[id number] DOB: 1946-04-23 DOA: 02/09/2023     3 DOS: the patient was seen and examined on 02/12/2023   Brief hospital course: Molly Leonard is a 77 y.o. female with a past medical history of anxiety, depression, asthma/COPD who presents after mechanical fall coming out of the house this morning tripping on her front step landing on her right hip.  Hip plain films positive for moderately displaced proximal right femoral neck fracture. She had Right hip cemented hemiarthroplasty 02/09/23. Has constipation. Able to void well. Wishes for rehab placement.  Assessment and Plan: * Hip fracture (HCC) S/p Right hip cemented hemiarthroplasty. Out of bed to chair, WBAT, incentive spirometry. Monitor bowel/ bladder function. TOC working on [employer] placement.  Fall at home, initial encounter Positive mechanical fall with patient tripping coming outside and landing on the right hip Continue fall precautions PT/ OT follow up. Out of bed to chair. Rehab.  Acute on chronic Anemia Acute blood loss anemia  CBC reviewed. Hb around 9, no need for transfusion. Continue to monitor H/H.  Depression, recurrent  Continue Prozac and as needed Xanax  Esophagitis, reflux Continue daily PPI  Chronic obstructive pulmonary disease (HCC) No exacerbation.  Continue home inhalers as needed.  DVT prophylaxis -Lovenox daily x 14 days post op. Nursing supportive care.     Subjective: Patient is seen and examined today morning. She is sitting in chair, denies pain. Has constipation, able to void. Grand daughter at bedside.   Physical Exam: Vitals:   02/11/23 1507 02/11/23 2322 02/12/23 0818 02/12/23 1322  BP: (!) 128/58 138/66 (!) 133/58   Pulse: (!) 103 (!) 109 (!) 105   Resp: 16 18 18    Temp: 98.4 F (36.9 C) 98.3 F (36.8 C) 98.8 F (37.1 C)   TempSrc:  Oral Oral   SpO2: 96% 94% 96% 90%  Weight:      Height:       Heart- S1, S2 heard, tachycardia  noted. Lungs bilateral air entry good,, bibasal rales. Abdomen - distended, nontender, bowel sounds good. Neuro- AAO x3, non focal exam.  Data Reviewed:  CBC, BMP  Family Communication: Patient and grand daughter at bedside agreed with current care plan.   Disposition: Status is: Inpatient Remains inpatient appropriate because: post op need pain control, voiding trial, PT eval  Planned Discharge Destination: Home with Home Health     Code Status: Full Code  Time spent: 43 minutes  Author: Marcelino Duster, MD 02/12/2023 3:08 PM  For on call review [email or web address].

## 2023-02-12 NOTE — Care Management Important Message (Signed)
Important Message  Patient Details  Name: EYMI CORRIVEAU MRN: [id number] Date of Birth: 09-15-1945   Medicare Important Message Given:  Yes     Olegario Messier A Susan Bleich 02/12/2023, 11:15 AM

## 2023-02-12 NOTE — Progress Notes (Signed)
Physical Therapy Treatment Patient Details Name: Molly Leonard MRN: [id number] DOB: 1946/03/08 Today's Date: 02/12/2023   History of Present Illness Molly Leonard is a 77 y.o. female with a past medical history of anxiety, depression, asthma/COPD who presents after mechanical fall coming out of the house this morning tripping on her front step landing on her right hip. Patient is s/p R hemiarthroplasty, WBAT    PT Comments    Patient received in recliner. She is agreeable to PT session. Patient continues to be slightly confused during session, granddaughter present. She requires frequent cues for hand placement/safety during mobility. Patient is progressing slowly with mobility and will continue to benefit from skilled PT to improve strength and functional independence.     Recommendations for follow up therapy are one component of a multi-disciplinary discharge planning process, led by the attending physician.  Recommendations may be updated based on patient status, additional functional criteria and insurance authorization.  Follow Up Recommendations  Can patient physically be transported by private vehicle: No    Assistance Recommended at Discharge Frequent or constant Supervision/Assistance  Patient can return home with the following A lot of help with bathing/dressing/bathroom;A lot of help with walking and/or transfers   Equipment Recommendations  Rolling walker (2 wheels)    Recommendations for Other Services       Precautions / Restrictions Precautions Precautions: Fall Restrictions Weight Bearing Restrictions: Yes RLE Weight Bearing: Weight bearing as tolerated     Mobility  Bed Mobility               General bed mobility comments: NT, patient received in recliner and returned to recliner    Transfers Overall transfer level: Needs assistance Equipment used: Rolling walker (2 wheels) Transfers: Sit to/from Stand Sit to Stand: Mod assist           General  transfer comment: Frequent cues for hand placement for safe transfer, poor carryover.    Ambulation/Gait Ambulation/Gait assistance: Min guard Gait Distance (Feet): 30 Feet Assistive device: Rolling walker (2 wheels) Gait Pattern/deviations: Step-through pattern, Decreased step length - right, Decreased step length - left, Decreased stride length       General Gait Details: Patient requiring encouragement throughout to progress gait distance. Frequent cues for hand placement on RW and safety. Ambulated on room air, O2 sats down to 85%   Stairs             Wheelchair Mobility    Modified Rankin (Stroke Patients Only)       Balance Overall balance assessment: Needs assistance Sitting-balance support: Feet supported Sitting balance-Leahy Scale: Fair     Standing balance support: Bilateral upper extremity supported, During functional activity Standing balance-Leahy Scale: Fair                              Cognition Arousal/Alertness: Awake/alert Behavior During Therapy: WFL for tasks assessed/performed Overall Cognitive Status: Impaired/Different from baseline Area of Impairment: Awareness, Problem solving, Safety/judgement, Following commands                       Following Commands: Follows one step commands inconsistently, Follows one step commands with increased time Safety/Judgement: Decreased awareness of safety   Problem Solving: Slow processing, Decreased initiation, Difficulty sequencing, Requires verbal cues, Requires tactile cues General Comments: occasionally not making sense when she is talking. Some mild confusion.        Exercises Total Joint Exercises  Ankle Circles/Pumps: AROM, Both, 10 reps    General Comments        Pertinent Vitals/Pain Pain Assessment Pain Assessment: Faces Faces Pain Scale: Hurts little more Pain Location: R LE Pain Descriptors / Indicators: Discomfort, Sore, Moaning, Grimacing, Guarding Pain  Intervention(s): Monitored during session, Repositioned, Premedicated before session    Home Living                          Prior Function            PT Goals (current goals can now be found in the care plan section) Acute Rehab PT Goals Patient Stated Goal: to improve, go home PT Goal Formulation: With patient Time For Goal Achievement: 02/24/23 Potential to Achieve Goals: Fair Progress towards PT goals: Progressing toward goals    Frequency    BID      PT Plan Current plan remains appropriate    Co-evaluation              AM-PAC PT "6 Clicks" Mobility   Outcome Measure  Help needed turning from your back to your side while in a flat bed without using bedrails?: A Lot Help needed moving from lying on your back to sitting on the side of a flat bed without using bedrails?: A Lot Help needed moving to and from a bed to a chair (including a wheelchair)?: A Little Help needed standing up from a chair using your arms (e.g., wheelchair or bedside chair)?: A Lot Help needed to walk in hospital room?: A Little Help needed climbing 3-5 steps with a railing? : Total 6 Click Score: 13    End of Session Equipment Utilized During Treatment: Gait belt;Oxygen Activity Tolerance: Patient limited by fatigue;Patient limited by pain Patient left: in chair;with call bell/phone within reach;with chair alarm set;with family/visitor present Nurse Communication: Mobility status PT Visit Diagnosis: Muscle weakness (generalized) (M62.81);Other abnormalities of gait and mobility (R26.89);Pain;Difficulty in walking, not elsewhere classified (R26.2);Unsteadiness on feet (R26.81);History of falling (Z91.81) Pain - Right/Left: Right Pain - part of body: Hip     Time: 0912-0930 PT Time Calculation (min) (ACUTE ONLY): 18 min  Charges:  $Gait Training: 8-22 mins                     Patria Warzecha, PT, GCS 02/12/23,10:04 AM

## 2023-02-12 NOTE — Plan of Care (Signed)
  Problem: Pain Managment: Goal: General experience of comfort will improve Outcome: Progressing   Problem: Elimination: Goal: Will not experience complications related to bowel motility Outcome: Progressing   Problem: Coping: Goal: Level of anxiety will decrease Outcome: Progressing   Problem: Nutrition: Goal: Adequate nutrition will be maintained Outcome: Progressing   Problem: Self-Concept: Goal: Ability to maintain and perform role responsibilities to the fullest extent possible will improve Outcome: Progressing   Problem: Activity: Goal: Ability to ambulate and perform ADLs will improve Outcome: Progressing

## 2023-02-12 NOTE — Progress Notes (Signed)
Physical Therapy Treatment Patient Details Name: Molly Leonard MRN: [id number] DOB: September 13, 1946 Today's Date: 02/12/2023   History of Present Illness Molly Leonard is a 77 y.o. female with a past medical history of anxiety, depression, asthma/COPD who presents after mechanical fall coming out of the house this morning tripping on her front step landing on her right hip. Patient is s/p R hemiarthroplasty, WBAT    PT Comments    Patient received in recliner, she is agreeable to PT session with encouragement. Patient requires mod A to stand and a couple of attempts to actually achieve standing. Patient requires min A for initial standing balance with cues for posture. She is able to increase ambulation distance with encouragement this pm. Patient will continue to benefit from skilled PT to improve mobility, safety and strength.       Recommendations for follow up therapy are one component of a multi-disciplinary discharge planning process, led by the attending physician.  Recommendations may be updated based on patient status, additional functional criteria and insurance authorization.  Follow Up Recommendations  Can patient physically be transported by private vehicle: No    Assistance Recommended at Discharge Frequent or constant Supervision/Assistance  Patient can return home with the following A lot of help with bathing/dressing/bathroom;A lot of help with walking and/or transfers   Equipment Recommendations  Rolling walker (2 wheels)    Recommendations for Other Services       Precautions / Restrictions Precautions Precautions: Fall Restrictions Weight Bearing Restrictions: Yes RLE Weight Bearing: Weight bearing as tolerated     Mobility  Bed Mobility               General bed mobility comments: NT, patient received in recliner and returned to recliner    Transfers Overall transfer level: Needs assistance Equipment used: Rolling walker (2 wheels) Transfers: Sit to/from  Stand Sit to Stand: Mod assist           General transfer comment: Frequent cues for hand placement for safe transfer, poor carryover.    Ambulation/Gait Ambulation/Gait assistance: Min guard Gait Distance (Feet): 45 Feet Assistive device: Rolling walker (2 wheels) Gait Pattern/deviations: Step-to pattern, Decreased step length - right, Decreased step length - left, Trunk flexed, Decreased stride length Gait velocity: decr     General Gait Details: Patient requiring encouragement throughout to progress gait distance. Frequent cues for hand placement on RW and safety. Ambulated on room air, O2 sats down to 85%, returned to 2 liters O2 via [place]   Stairs             Wheelchair Mobility    Modified Rankin (Stroke Patients Only)       Balance Overall balance assessment: Needs assistance Sitting-balance support: Feet supported Sitting balance-Leahy Scale: Good     Standing balance support: Bilateral upper extremity supported, During functional activity, Reliant on assistive device for balance Standing balance-Leahy Scale: Fair Standing balance comment: min A with RW for initial standing, min guard as progresses                            Cognition Arousal/Alertness: Awake/alert Behavior During Therapy: WFL for tasks assessed/performed Overall Cognitive Status: Impaired/Different from baseline Area of Impairment: Awareness, Problem solving, Safety/judgement, Following commands                       Following Commands: Follows one step commands inconsistently, Follows one step commands with increased time  Safety/Judgement: Decreased awareness of safety   Problem Solving: Slow processing, Decreased initiation, Difficulty sequencing, Requires verbal cues, Requires tactile cues General Comments: occasionally not making sense when she is talking. Some mild confusion.        Exercises Total Joint Exercises Ankle Circles/Pumps: AROM, Both, 10  reps    General Comments        Pertinent Vitals/Pain Pain Assessment Pain Assessment: Faces Faces Pain Scale: Hurts little more Pain Location: R LE Pain Descriptors / Indicators: Discomfort, Sore, Moaning, Grimacing, Guarding Pain Intervention(s): Monitored during session, Repositioned, Ice applied    Home Living                          Prior Function            PT Goals (current goals can now be found in the care plan section) Acute Rehab PT Goals Patient Stated Goal: to improve, go home PT Goal Formulation: With patient Time For Goal Achievement: 02/24/23 Potential to Achieve Goals: Fair Progress towards PT goals: Progressing toward goals    Frequency    BID      PT Plan Current plan remains appropriate    Co-evaluation              AM-PAC PT "6 Clicks" Mobility   Outcome Measure  Help needed turning from your back to your side while in a flat bed without using bedrails?: A Lot Help needed moving from lying on your back to sitting on the side of a flat bed without using bedrails?: A Lot Help needed moving to and from a bed to a chair (including a wheelchair)?: A Little Help needed standing up from a chair using your arms (e.g., wheelchair or bedside chair)?: A Lot Help needed to walk in hospital room?: A Little Help needed climbing 3-5 steps with a railing? : Total 6 Click Score: 13    End of Session Equipment Utilized During Treatment: Gait belt;Oxygen Activity Tolerance: Patient limited by pain;Patient limited by fatigue Patient left: in chair;with call bell/phone within reach;with chair alarm set;with family/visitor present Nurse Communication: Mobility status PT Visit Diagnosis: Muscle weakness (generalized) (M62.81);Other abnormalities of gait and mobility (R26.89);Pain;Difficulty in walking, not elsewhere classified (R26.2);Unsteadiness on feet (R26.81);History of falling (Z91.81) Pain - Right/Left: Right Pain - part of body: Hip      Time: 1300-1318 PT Time Calculation (min) (ACUTE ONLY): 18 min  Charges:  $Gait Training: 8-22 mins                     Electra Paladino, PT, GCS 02/12/23,1:27 PM

## 2023-02-13 DIAGNOSIS — K5904 Chronic idiopathic constipation: Secondary | ICD-10-CM

## 2023-02-13 LAB — URINALYSIS, ROUTINE W REFLEX MICROSCOPIC
Bilirubin Urine: NEGATIVE
Glucose, UA: NEGATIVE mg/dL
Ketones, ur: NEGATIVE mg/dL
Nitrite: NEGATIVE
Protein, ur: 100 mg/dL — AB
Specific Gravity, Urine: 1.017 (ref 1.005–1.030)
pH: 5 (ref 5.0–8.0)

## 2023-02-13 MED ORDER — ACETAMINOPHEN 325 MG PO TABS: 325.0000 mg | ORAL_TABLET | Freq: Four times a day (QID) | ORAL | Status: DC | PRN

## 2023-02-13 MED ORDER — DOCUSATE SODIUM 100 MG PO CAPS: 200.0000 mg | ORAL_CAPSULE | Freq: Two times a day (BID) | ORAL | 0 refills | Status: DC

## 2023-02-13 MED ORDER — POTASSIUM CHLORIDE 20 MEQ PO PACK: 20.0000 meq | PACK | Freq: Two times a day (BID) | ORAL | Status: DC

## 2023-02-13 MED ORDER — BISACODYL 10 MG RE SUPP
10.0000 mg | Freq: Every day | RECTAL | Status: DC | PRN
  Administered 2023-02-14: 10 mg via RECTAL
  Filled 2023-02-13: qty 1

## 2023-02-13 MED ORDER — MAGNESIUM HYDROXIDE 400 MG/5ML PO SUSP
30.0000 mL | Freq: Once | ORAL | Status: AC
  Administered 2023-02-13: 30 mL via ORAL
  Filled 2023-02-13: qty 30

## 2023-02-13 MED ORDER — HYDROCODONE-ACETAMINOPHEN 5-325 MG PO TABS: 1.0000 | ORAL_TABLET | ORAL | 0 refills | Status: DC | PRN

## 2023-02-13 MED ORDER — ENOXAPARIN SODIUM 40 MG/0.4ML IJ SOSY: 40.0000 mg | PREFILLED_SYRINGE | INTRAMUSCULAR | 0 refills | Status: DC

## 2023-02-13 NOTE — Plan of Care (Signed)
  Problem: Education: Goal: Knowledge of General Education information will improve Description: Including pain rating scale, medication(s)/side effects and non-pharmacologic comfort measures Outcome: Progressing   Problem: Health Behavior/Discharge Planning: Goal: Ability to manage health-related needs will improve Outcome: Progressing   Problem: Clinical Measurements: Goal: Ability to maintain clinical measurements within normal limits will improve Outcome: Progressing Goal: Will remain free from infection Outcome: Progressing Goal: Diagnostic test results will improve Outcome: Progressing Goal: Respiratory complications will improve Outcome: Progressing Goal: Cardiovascular complication will be avoided Outcome: Progressing   Problem: Activity: Goal: Risk for activity intolerance will decrease Outcome: Progressing   Problem: Nutrition: Goal: Adequate nutrition will be maintained Outcome: Progressing   Problem: Coping: Goal: Level of anxiety will decrease Outcome: Progressing   Problem: Elimination: Goal: Will not experience complications related to bowel motility Outcome: Progressing Goal: Will not experience complications related to urinary retention Outcome: Progressing   Problem: Pain Managment: Goal: General experience of comfort will improve Outcome: Progressing   Problem: Safety: Goal: Ability to remain free from injury will improve Outcome: Progressing   Problem: Skin Integrity: Goal: Risk for impaired skin integrity will decrease Outcome: Progressing   Problem: Education: Goal: Knowledge of the prescribed therapeutic regimen will improve Outcome: Progressing Goal: Understanding of discharge needs will improve Outcome: Progressing Goal: Individualized Educational Video(s) Outcome: Progressing   Problem: Activity: Goal: Ability to avoid complications of mobility impairment will improve Outcome: Progressing Goal: Ability to tolerate increased  activity will improve Outcome: Progressing   Problem: Clinical Measurements: Goal: Postoperative complications will be avoided or minimized Outcome: Progressing   Problem: Pain Management: Goal: Pain level will decrease with appropriate interventions Outcome: Progressing   Problem: Skin Integrity: Goal: Will show signs of wound healing Outcome: Progressing   Problem: Education: Goal: Verbalization of understanding the information provided (i.e., activity precautions, restrictions, etc) will improve Outcome: Progressing Goal: Individualized Educational Video(s) Outcome: Progressing   Problem: Activity: Goal: Ability to ambulate and perform ADLs will improve Outcome: Progressing   Problem: Clinical Measurements: Goal: Postoperative complications will be avoided or minimized Outcome: Progressing   Problem: Self-Concept: Goal: Ability to maintain and perform role responsibilities to the fullest extent possible will improve Outcome: Progressing   Problem: Pain Management: Goal: Pain level will decrease Outcome: Progressing   

## 2023-02-13 NOTE — Progress Notes (Addendum)
   Subjective: 4 Days Post-Op Procedure(s) (LRB): ARTHROPLASTY BIPOLAR HIP (HEMIARTHROPLASTY) (Right) Patient reports pain as mild.   Patient is well, and has had no acute complaints or problems. Denies any CP, SOB, ABD pain. Making progress with PT - BM   Objective: Vital signs in last 24 hours: Temp:  [97.9 F (36.6 C)-98.5 F (36.9 C)] 97.9 F (36.6 C) (06/01 0817) Pulse Rate:  [92-100] 94 (06/01 0817) Resp:  [18-20] 18 (06/01 0817) BP: (129-142)/(57-67) 129/67 (06/01 0817) SpO2:  [90 %-100 %] 100 % (06/01 0817)  Intake/Output from previous day: 05/31 0701 - 06/01 0700 In: -  Out: 350 [Urine:350] Intake/Output this shift: No intake/output data recorded.  Recent Labs    02/11/23 0718 02/12/23 0408  HGB 9.2* 8.8*   Recent Labs    02/11/23 0718 02/12/23 0408  WBC 13.3* 11.4*  RBC 2.83* 2.74*  HCT 28.6* 27.2*  PLT 312 294   Recent Labs    02/11/23 0718 02/12/23 0408  NA 137 136  K 3.4* 3.3*  CL 107 105  CO2 21* 20*  BUN 34* 35*  CREATININE 1.27* 1.06*  GLUCOSE 138* 123*  CALCIUM 8.1* 8.4*   No results for input(s): "LABPT", "INR" in the last 72 hours.  EXAM General - Patient is Alert, Appropriate, and Oriented Extremity - Neurovascular intact Sensation intact distally Intact pulses distally Dorsiflexion/Plantar flexion intact Dressing - dressing C/D/I and no drainage Motor Function - intact, moving foot and toes well on exam.   Past Medical History:  Diagnosis Date   Anxiety    Depression     Assessment/Plan:   4 Days Post-Op Procedure(s) (LRB): ARTHROPLASTY BIPOLAR HIP (HEMIARTHROPLASTY) (Right) Principal Problem:   Closed right hip fracture, initial encounter [facility]) Active Problems:   Chronic obstructive pulmonary disease (HCC)   Esophagitis, reflux   Depression, recurrent (HCC)   Acute on chronic anemia   Fall at home, initial encounter   Displaced fracture of right femoral neck (HCC)  Estimated body mass index is 21.93 kg/m as  calculated from the following:   Height as of this encounter: 5\' 7"  (1.702 m).   Weight as of this encounter: 63.5 kg. Advance diet Up with therapy Pain well controlled Vital signs stable Labs pending this am, continue to monitor Hgb. Increased bowel regimen, work on BM. No abd pain, N/V CM to assist with discharge, slow progress with PT. Will need SNF   Follow up with [facility] in 2 weeks Lovenox 40 mg SQ daily x 14 days at discharge   DVT Prophylaxis - Lovenox, TED hose, and SCDs Weight-Bearing as tolerated to right leg   T. Cranston Neighbor, PA-C [facility] 02/13/2023, 8:58 AM  Patient seen and examined, agree with above plan.  The patient is doing well status post right hip hemiarthroplasty, no concerns at this time, labs stable.  Pain is controlled.  Discussed DVT prophylaxis, pain medication use, and safe transition to SNF.  All questions answered the patient agrees with above plan will go to SNF when bed available.   Reinaldo Berber MD

## 2023-02-13 NOTE — Progress Notes (Signed)
1000cc Soap Suds Enema. Return was brown water with 1 tiny formed stool (ball)

## 2023-02-13 NOTE — TOC PASRR Note (Signed)
RE: Molly Leonard Date of Birth:  12/02/1945 Date:02/13/23  To Whom It May Concern:  Please be advised that the above-named patient will require a short-term nursing home stay - anticipated 30 days or less for rehabilitation and strengthening.  The plan is for return home.

## 2023-02-13 NOTE — Progress Notes (Signed)
  Progress Note   Patient: Molly Leonard ZOX:[id number] DOB: [birth year] DOA: 02/09/2023     4 DOS: the patient was seen and examined on 02/13/2023   Brief hospital course: Molly Leonard is a 77 y.o. female with a past medical history of anxiety, depression, asthma/COPD who presents after mechanical fall coming out of the house this morning tripping on her front step landing on her right hip.  Hip plain films positive for moderately displaced proximal right femoral neck fracture. She had Right hip cemented hemiarthroplasty 02/09/23. Awaiting short term rehab placement.  Assessment and Plan: * Hip fracture (HCC) S/p Right hip cemented hemiarthroplasty. Out of bed to chair, WBAT, incentive spirometry. Monitor bowel/ bladder function. TOC working on short term rehab placement.  Fall at home, initial encounter Positive mechanical fall with patient tripping coming outside and landing on the right hip Continue fall precautions PT/ OT advised rehab. Out of bed to chair.   Acute on chronic Anemia Acute blood loss anemia  CBC reviewed. Hb around 9, no need for transfusion. Continue to monitor H/H.  Depression, recurrent  Continue Prozac and as needed Xanax  Esophagitis, reflux Continue daily PPI  Chronic obstructive pulmonary disease (HCC) No exacerbation.  Continue home inhalers as needed.  Will continue constipation regimen. Add soap suds enema PRN. DVT prophylaxis -Lovenox daily x 14 days post op. Nursing supportive care.     Subjective: Patient is seen and examined today morning. She is sitting in chair, has constipation, voiding good. Eating poor. Encouraged incentive spirometry. No family at bedside.  Physical Exam: Vitals:   02/12/23 1322 02/12/23 1600 02/12/23 2314 02/13/23 0817  BP:  (!) 138/58 (!) 142/57 129/67  Pulse:  92 100 94  Resp:  18 20 18   Temp:  98.5 F (36.9 C) 98.2 F (36.8 C) 97.9 F (36.6 C)  TempSrc:  Oral    SpO2: 90% 100% 95% 100%  Weight:      Height:        Heart- S1, S2 heard, tachycardia noted. Lungs bilateral air entry good,, bibasal rales. Abdomen - distended, nontender, bowel sounds good. Neuro- AAO x3, non focal exam.  Data Reviewed:  No new labs  Family Communication: Patient and grand daughter at bedside agreed with current care plan.   Disposition: Status is: Inpatient Remains inpatient appropriate because: safe discharge plan.  Planned Discharge Destination: short term rehab.     Code Status: Full Code  Time spent: 44 minutes  Author: Marcelino Duster, MD 02/13/2023 1:53 PM  For on call review [email or web address].

## 2023-02-13 NOTE — TOC Progression Note (Signed)
Transition of Care [facility]) - Progression Note    Patient Details  Name: Molly Leonard MRN: [id number] Date of Birth: 30-Dec-1945  Transition of Care [facility]) CM/SW Contact  Garret Reddish, RN Phone Number: 02/13/2023, 9:45 AM  Clinical Narrative:  Chart reviewed. Patient was admitted for fracture of the left femoral neck.    I have meet with patient at bedside on yesterday.  Mrs. Poncedeleon informs me that prior to admission she lived at home with her husband.  She informed me that she was still working.  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  She reports that she fell trying to get out the door to go to work.  She was able to get around without any assistive devices.    I have spoken to Mrs. Pitsenbarger about Short term rehab per PT recommendations.  Mrs. Holladay is agreeable to going to Short term rehab prior to returning home.  Mrs. Surratt would like to go to [facility] if they have a bed available.  Mrs. Levert is agreeable to a bed search in the [place] area.    TOC will complete SNF work up and bed search to [place] area.   TOC will continue to follow for discharge needs.      Expected Discharge Plan: Skilled Nursing Facility Barriers to Discharge: SNF Pending bed offer, Insurance Authorization, Continued Medical Work up  Expected Discharge Plan and Services       Living arrangements for the past 2 months: Single Family Home                                       Social Determinants of Health (SDOH) Interventions SDOH Screenings   Food Insecurity: No Food Insecurity (02/09/2023)  Housing: Low Risk  (02/09/2023)  Transportation Needs: No Transportation Needs (02/09/2023)  Utilities: Not At Risk (02/09/2023)  Alcohol Screen: Low Risk  (07/20/2022)  Depression (PHQ2-9): High Risk (07/20/2022)  Financial Resource Strain: Low Risk  (10/28/2021)  Physical Activity: Unknown (10/28/2021)  Social Connections: Moderately Isolated (10/28/2021)  Stress: No Stress Concern  Present (10/28/2021)  Tobacco Use: High Risk (02/10/2023)    Readmission Risk Interventions     No data to display

## 2023-02-13 NOTE — [place] FL2 (Signed)
[place] MEDICAID FL2 LEVEL OF CARE FORM     IDENTIFICATION  Patient Name: Molly Leonard Birthdate: 1946-09-09 Sex: female Admission Date (Current Location): 02/09/2023  [facility] and [place] Number:  Chiropodist and Address:  [facility], [address] [place], [place] [address]      Provider Number: [id number]  Attending Physician Name and Address:  Marcelino Duster, MD  Relative Name and Phone Number:  Joella, Chia (Spouse) [phone] [facility] Phone)    Current Level of Care: Hospital Recommended Level of Care: Skilled Nursing Facility Prior Approval Number:    Date Approved/Denied:   PASRR Number: pending  Discharge Plan: SNF    Current Diagnoses: Patient Active Problem List   Diagnosis Date Noted   Closed right hip fracture, initial encounter (HCC) 02/09/2023   Fall at home, initial encounter 02/09/2023   Displaced fracture of right femoral neck (HCC) 02/09/2023   Acute on chronic anemia 03/18/2022   Upper respiratory tract infection 03/18/2022   White coat syndrome with hypertension 07/22/2021   Depression, recurrent (HCC) 07/22/2021   Dyspareunia in female 12/04/2015   Allergic rhinitis 10/22/2015   History of anemia 10/22/2015   Airway hyperreactivity 10/22/2015   Chronic obstructive pulmonary disease (HCC) 10/22/2015   Esophagitis, reflux 10/22/2015   Hypercholesterolemia 10/22/2015   Cannot sleep 10/22/2015   Arthritis, degenerative 10/22/2015   Current tobacco use 10/22/2015   Anxiety 05/21/2015    Orientation RESPIRATION BLADDER Height & Weight     Situation, Place, Self  O2 (2L) Indwelling catheter, Continent Weight: 140 lb (63.5 kg) Height:  5\' 7"  (170.2 cm)  BEHAVIORAL SYMPTOMS/MOOD NEUROLOGICAL BOWEL NUTRITION STATUS        Diet (regular)  AMBULATORY STATUS COMMUNICATION OF NEEDS Skin   Limited Assist Verbally Surgical wounds (R hip)                       Personal Care Assistance Level of  Assistance  Bathing, Feeding, Dressing Bathing Assistance: Limited assistance Feeding assistance: Limited assistance Dressing Assistance: Limited assistance     Functional Limitations Info             SPECIAL CARE FACTORS FREQUENCY  PT (By licensed PT), OT (By licensed OT)     PT Frequency: 5 times per week OT Frequency: 5 times per week            Contractures      Additional Factors Info  Code Status, Allergies Code Status Info: full Allergies Info: nka           Current Medications (02/13/2023):  This is the current hospital active medication list Current Facility-Administered Medications  Medication Dose Route Frequency Provider Last Rate Last Admin   0.9 %  sodium chloride infusion   Intravenous Continuous Floydene Flock, MD   Stopped at 02/11/23 1024   acetaminophen (TYLENOL) tablet 325-650 mg  325-650 mg Oral Q6H PRN Reinaldo Berber, MD   650 mg at 02/13/23 0926   albuterol (PROVENTIL) (2.5 MG/3ML) 0.083% nebulizer solution 2.5 mg  2.5 mg Inhalation Q6H PRN Floydene Flock, MD       ALPRAZolam Prudy Feeler) tablet 1 mg  1 mg Oral TID PRN Floydene Flock, MD   1 mg at 02/13/23 0926   bisacodyl (DULCOLAX) suppository 10 mg  10 mg Rectal Daily PRN Evon Slack, PA-C       dextrose 5 %-0.9 % sodium chloride infusion   Intravenous Continuous Pilar Jarvis, MD 100  mL/hr at 02/11/23 1734 IV Pump Association at 02/11/23 1734   docusate sodium (COLACE) capsule 200 mg  200 mg Oral BID Marcelino Duster, MD   200 mg at 02/13/23 0925   enoxaparin (LOVENOX) injection 40 mg  40 mg Subcutaneous Q24H Reinaldo Berber, MD   40 mg at 02/12/23 0840   FLUoxetine (PROZAC) capsule 40 mg  40 mg Oral Daily Floydene Flock, MD   40 mg at 02/13/23 7829   hydrochlorothiazide (HYDRODIURIL) tablet 12.5 mg  12.5 mg Oral Daily Floydene Flock, MD   12.5 mg at 02/13/23 0926   HYDROcodone-acetaminophen (NORCO) 7.5-325 MG per tablet 1-2 tablet  1-2 tablet Oral Q4H PRN Reinaldo Berber, MD    1 tablet at 02/09/23 2328   HYDROcodone-acetaminophen (NORCO/VICODIN) 5-325 MG per tablet 1-2 tablet  1-2 tablet Oral Q4H PRN Reinaldo Berber, MD   1 tablet at 02/13/23 0136   menthol-cetylpyridinium (CEPACOL) lozenge 3 mg  1 lozenge Oral PRN Reinaldo Berber, MD       Or   phenol (CHLORASEPTIC) mouth spray 1 spray  1 spray Mouth/Throat PRN Reinaldo Berber, MD       metoCLOPramide (REGLAN) tablet 5-10 mg  5-10 mg Oral Q8H PRN Reinaldo Berber, MD       Or   metoCLOPramide (REGLAN) injection 5-10 mg  5-10 mg Intravenous Q8H PRN Reinaldo Berber, MD       morphine (PF) 2 MG/ML injection 0.5-1 mg  0.5-1 mg Intravenous Q2H PRN Reinaldo Berber, MD   1 mg at 02/10/23 1629   morphine (PF) 4 MG/ML injection 4 mg  4 mg Intravenous Q2H PRN Floydene Flock, MD       ondansetron [facility]) tablet 4 mg  4 mg Oral Q6H PRN Reinaldo Berber, MD   4 mg at 02/13/23 0136   Or   ondansetron (ZOFRAN) injection 4 mg  4 mg Intravenous Q6H PRN Reinaldo Berber, MD   4 mg at 02/09/23 2133   pantoprazole (PROTONIX) EC tablet 20 mg  20 mg Oral Daily Floydene Flock, MD   20 mg at 02/12/23 0931   polyethylene glycol (MIRALAX / GLYCOLAX) packet 17 g  17 g Oral Daily Marcelino Duster, MD   17 g at 02/12/23 0931   potassium chloride (KLOR-CON) packet 40 mEq  40 mEq Oral Daily Marcelino Duster, MD   40 mEq at 02/13/23 5621   senna (SENOKOT) tablet 17.2 mg  2 tablet Oral QHS Marcelino Duster, MD   17.2 mg at 02/12/23 2113     Discharge Medications: Please see discharge summary for a list of discharge medications.  Relevant Imaging Results:  Relevant Lab Results:   Additional Information SS #: [id number]  Kendal Raffo E Levora Werden, LCSW

## 2023-02-13 NOTE — Progress Notes (Signed)
PT Cancellation Note  Patient Details Name: Molly Leonard MRN: [id number] DOB: [birth year]   Cancelled Treatment:     PT attempt, Pt requested author return at a later time. Will return in 1 hour as requested.    Rushie Chestnut 02/13/2023, 10:11 AM

## 2023-02-13 NOTE — TOC Progression Note (Signed)
Transition of Care [facility]) - Progression Note    Patient Details  Name: Molly Leonard MRN: [id number] Date of Birth: Mar 06, 1946  Transition of Care [facility]) CM/SW Contact  Liliana Cline, LCSW Phone Number: 02/13/2023, 2:11 PM  Clinical Narrative:    PASRR pending. Information uploaded.    Expected Discharge Plan: Skilled Nursing Facility Barriers to Discharge: SNF Pending bed offer, Insurance Authorization, Continued Medical Work up  Expected Discharge Plan and Services       Living arrangements for the past 2 months: Single Family Home                                       Social Determinants of Health (SDOH) Interventions SDOH Screenings   Food Insecurity: No Food Insecurity (02/09/2023)  Housing: Low Risk  (02/09/2023)  Transportation Needs: No Transportation Needs (02/09/2023)  Utilities: Not At Risk (02/09/2023)  Alcohol Screen: Low Risk  (07/20/2022)  Depression (PHQ2-9): High Risk (07/20/2022)  Financial Resource Strain: Low Risk  (10/28/2021)  Physical Activity: Unknown (10/28/2021)  Social Connections: Moderately Isolated (10/28/2021)  Stress: No Stress Concern Present (10/28/2021)  Tobacco Use: High Risk (02/10/2023)    Readmission Risk Interventions     No data to display

## 2023-02-13 NOTE — Progress Notes (Signed)
Physical Therapy Treatment Patient Details Name: Molly Leonard MRN: [id number] DOB: [birth year] Today's Date: 02/13/2023   History of Present Illness Molly Leonard is a 77 y.o. female with a past medical history of anxiety, depression, asthma/COPD who presents after mechanical fall coming out of the house this morning tripping on her front step landing on her right hip. Patient is s/p R hemiarthroplasty, WBAT    PT Comments    Pt was sitting in recliner on 2 L o2 upon arrival. Supportive granddaughter present and very helpful. Pt needs encouragement to participate but eventually is agreeable. Per grand daughter, pt is far form baseline cognition.  She requires vcs throughout session for desired task. She was able to stand with mod assist and ambulate ~ 45 ft. Author attempted to wean pt to rm air however pt desaturates. Reapplied 2 L o2 and assisted pt back to room. PT overall is limited by pain and fatigue. Will greatly benefit from continued skilled PT at DC to maximize her independence and safety with all ADLs.    Recommendations for follow up therapy are one component of a multi-disciplinary discharge planning process, led by the attending physician.  Recommendations may be updated based on patient status, additional functional criteria and insurance authorization.  Follow Up Recommendations  Can patient physically be transported by private vehicle: No    Assistance Recommended at Discharge Frequent or constant Supervision/Assistance  Patient can return home with the following A little help with walking and/or transfers;A lot of help with bathing/dressing/bathroom;Assistance with cooking/housework;Direct supervision/assist for medications management;Direct supervision/assist for financial management;Assist for transportation;Help with stairs or ramp for entrance   Equipment Recommendations  Other (comment) (defer to next level of care)    Recommendations for Other Services       Precautions  / Restrictions Precautions Precautions: Fall Restrictions Weight Bearing Restrictions: Yes RLE Weight Bearing: Weight bearing as tolerated     Mobility  Bed Mobility Overal bed mobility: Needs Assistance Bed Mobility: Supine to Sit  Supine to sit: Max assist Sit to supine: Max assist General bed mobility comments: pt was in recliner pre/post session    Transfers Overall transfer level: Needs assistance Equipment used: Rolling walker (2 wheels) Transfers: Sit to/from Stand Sit to Stand: Mod assist  General transfer comment: pt required mod assist to stand form recliner and to sit with eccentric control. VC/tcs throughout for safety    Ambulation/Gait Ambulation/Gait assistance: Min assist Gait Distance (Feet): 45 Feet Assistive device: Rolling walker (2 wheels) Gait Pattern/deviations: Step-to pattern, Narrow base of support, Antalgic Gait velocity: decr  General Gait Details: Pt was able to ambulate ~ 45 ft with min assist + alot of vcing. Author attempted wean to rm air however pt desaturates to low 80s. Author questions pulse ox reliability. replaced pt backl on 2 L throughout remainder of session.    Balance Overall balance assessment: Needs assistance Sitting-balance support: Feet supported Sitting balance-Leahy Scale: Good Sitting balance - Comments: CGA this afternoon for sitting balance   Standing balance support: Bilateral upper extremity supported, During functional activity, Reliant on assistive device for balance Standing balance-Leahy Scale: Fair Standing balance comment: constant assistance required this afternoon. more overall assistance with all mobility, tranfers, and during limited gait       Cognition Arousal/Alertness: Awake/alert Behavior During Therapy: WFL for tasks assessed/performed Overall Cognitive Status: Impaired/Different from baseline Area of Impairment: Awareness, Problem solving, Safety/judgement, Following commands    Following  Commands: Follows one step commands inconsistently Safety/Judgement: Decreased awareness of  deficits, Decreased awareness of safety Awareness: Intellectual Problem Solving: Slow processing, Decreased initiation, Difficulty sequencing, Requires verbal cues, Requires tactile cues General Comments: Pt was alert but is disoriented to time and overall situation. poor carry-over between cueing               Pertinent Vitals/Pain Pain Assessment Pain Assessment: 0-10 Pain Score: 3  Faces Pain Scale: Hurts a little bit Pain Location: R LE Pain Descriptors / Indicators: Discomfort, Sore, Moaning, Grimacing, Guarding Pain Intervention(s): Limited activity within patient's tolerance, Monitored during session, Premedicated before session, Repositioned     PT Goals (current goals can now be found in the care plan section) Acute Rehab PT Goals Patient Stated Goal: get better so I can go home." PT Goal Formulation: With patient/family Progress towards PT goals: Progressing toward goals    Frequency    BID      PT Plan Current plan remains appropriate       AM-PAC PT "6 Clicks" Mobility   Outcome Measure  Help needed turning from your back to your side while in a flat bed without using bedrails?: A Lot Help needed moving from lying on your back to sitting on the side of a flat bed without using bedrails?: A Lot Help needed moving to and from a bed to a chair (including a wheelchair)?: A Lot Help needed standing up from a chair using your arms (e.g., wheelchair or bedside chair)?: A Lot Help needed to walk in hospital room?: A Lot Help needed climbing 3-5 steps with a railing? : A Lot 6 Click Score: 12    End of Session Equipment Utilized During Treatment: Oxygen;Gait belt (2 L o2) Activity Tolerance: Patient limited by fatigue Patient left: in chair;with chair alarm set;with family/visitor present Nurse Communication: Mobility status PT Visit Diagnosis: Muscle weakness  (generalized) (M62.81);Other abnormalities of gait and mobility (R26.89);Pain;Difficulty in walking, not elsewhere classified (R26.2);Unsteadiness on feet (R26.81);History of falling (Z91.81) Pain - Right/Left: Right Pain - part of body: Hip     Time: 1212-1236 PT Time Calculation (min) (ACUTE ONLY): 24 min  Charges:  $Gait Training: 8-22 mins $Therapeutic Activity: 8-22 mins                     Jetta Lout PTA 02/13/23, 4:46 PM

## 2023-02-13 NOTE — Progress Notes (Signed)
Physical Therapy Treatment Patient Details Name: Molly Leonard MRN: [id number] DOB: 11-28-1945 Today's Date: 02/13/2023   History of Present Illness TRENE BIEHN is a 77 y.o. female with a past medical history of anxiety, depression, asthma/COPD who presents after mechanical fall coming out of the house this morning tripping on her front step landing on her right hip. Patient is s/p R hemiarthroplasty, WBAT    PT Comments    Pt was supine in bed with supportive family members at bedside. Pt c/o being cold and in pain. Overall pt more anxious this afternoon. Perseverating on different thing. Pt's cognition even more concerning than earlier session. Pt's granddaughter/spouse were present and confirmed she is far from baseline cognition. MD/RN made aware. Requested UA. This PT session only included getting OOB to [facility]. She required more assistance this afternoon than earlier in the day with increased Vcs for technique and sequencing. Pt will continue to benefit from skilled PT to maximize her independence while decreasing caregiver burden.    Recommendations for follow up therapy are one component of a multi-disciplinary discharge planning process, led by the attending physician.  Recommendations may be updated based on patient status, additional functional criteria and insurance authorization.  Follow Up Recommendations  Can patient physically be transported by private vehicle: No    Assistance Recommended at Discharge Frequent or constant Supervision/Assistance  Patient can return home with the following A lot of help with bathing/dressing/bathroom;A lot of help with walking and/or transfers   Equipment Recommendations  Other (comment) (Defer to next level of care)       Precautions / Restrictions Precautions Precautions: Fall Restrictions Weight Bearing Restrictions: Yes RLE Weight Bearing: Weight bearing as tolerated     Mobility  Bed Mobility Overal bed mobility: Needs Assistance Bed  Mobility: Supine to Sit  Supine to sit: Max assist Sit to supine: Max assist General bed mobility comments: Pt required max assist to safely exit and re-enter bed this PM    Transfers Overall transfer level: Needs assistance Equipment used: Rolling walker (2 wheels) Transfers: Sit to/from Stand Sit to Stand: Max assist  General transfer comment: Max assist to stand from EOB. max vcs. pt easily distracted    Ambulation/Gait Ambulation/Gait assistance: Mod assist Gait Distance (Feet): 3 Feet Assistive device: Rolling walker (2 wheels) Gait Pattern/deviations: Step-to pattern, Antalgic, Trunk flexed, Narrow base of support Gait velocity: decr  General Gait Details: Pt required more asisstance to ambulate shorter distance this afternoon. Due to cognition and increased assistance. pt only ambulate form EOB to [facility] and then back to bed.    Balance Overall balance assessment: Needs assistance Sitting-balance support: Feet supported Sitting balance-Leahy Scale: Fair Sitting balance - Comments: CGA this afternoon for sitting balance   Standing balance support: Bilateral upper extremity supported, During functional activity, Reliant on assistive device for balance Standing balance-Leahy Scale: Poor Standing balance comment: constant assistance required this afternoon. more overall assistance with all mobility, tranfers, and during limited gait       Cognition Arousal/Alertness: Awake/alert Behavior During Therapy: Anxious Overall Cognitive Status: Impaired/Different from baseline Area of Impairment: Awareness, Problem solving, Safety/judgement, Following commands      Following Commands: Follows one step commands inconsistently Safety/Judgement: Decreased awareness of deficits, Decreased awareness of safety Awareness: Intellectual Problem Solving: Slow processing, Decreased initiation, Difficulty sequencing, Requires verbal cues, Requires tactile cues General Comments: Pt 's  cognition seems to be getting worse per RN/family. Authro requested UA and informed MD of concerns.  Pertinent Vitals/Pain Pain Assessment Pain Assessment: 0-10 Pain Score: 4  Faces Pain Scale: Hurts a little bit Pain Location: R LE Pain Descriptors / Indicators: Discomfort, Sore, Moaning, Grimacing, Guarding Pain Intervention(s): Limited activity within patient's tolerance, Monitored during session, Premedicated before session, Repositioned, Ice applied     PT Goals (current goals can now be found in the care plan section) Acute Rehab PT Goals Patient Stated Goal: none stated PT Goal Formulation: With patient/family Progress towards PT goals: Not progressing toward goals - comment (pt required increased asistance this PM versus AM session)    Frequency    BID      PT Plan Current plan remains appropriate       AM-PAC PT "6 Clicks" Mobility   Outcome Measure  Help needed turning from your back to your side while in a flat bed without using bedrails?: A Lot Help needed moving from lying on your back to sitting on the side of a flat bed without using bedrails?: A Lot Help needed moving to and from a bed to a chair (including a wheelchair)?: A Lot Help needed standing up from a chair using your arms (e.g., wheelchair or bedside chair)?: A Lot Help needed to walk in hospital room?: A Lot Help needed climbing 3-5 steps with a railing? : Total 6 Click Score: 11    End of Session Equipment Utilized During Treatment: Gait belt;Oxygen Activity Tolerance: Patient limited by pain;Treatment limited secondary to medical complications (Comment) (limited by confusion) Patient left: in bed;with call bell/phone within reach;with bed alarm set;with family/visitor present;with SCD's reapplied Nurse Communication: Mobility status;Other (comment) (secure chat MD and RN) PT Visit Diagnosis: Muscle weakness (generalized) (M62.81);Other abnormalities of gait and mobility  (R26.89);Pain;Difficulty in walking, not elsewhere classified (R26.2);Unsteadiness on feet (R26.81);History of falling (Z91.81) Pain - Right/Left: Right Pain - part of body: Hip     Time: 5621-3086 PT Time Calculation (min) (ACUTE ONLY): 12 min  Charges:  $Therapeutic Activity: 8-22 mins                    Jetta Lout PTA 02/13/23, 4:36 PM

## 2023-02-14 DIAGNOSIS — G4489 Other headache syndrome: Secondary | ICD-10-CM

## 2023-02-14 LAB — CBC
HCT: 25.5 % — ABNORMAL LOW (ref 36.0–46.0)
Hemoglobin: 8.3 g/dL — ABNORMAL LOW (ref 12.0–15.0)
MCH: 32.5 pg (ref 26.0–34.0)
MCHC: 32.5 g/dL (ref 30.0–36.0)
MCV: 100 fL (ref 80.0–100.0)
Platelets: 373 10*3/uL (ref 150–400)
RBC: 2.55 MIL/uL — ABNORMAL LOW (ref 3.87–5.11)
RDW: 13.2 % (ref 11.5–15.5)
WBC: 9.7 10*3/uL (ref 4.0–10.5)
nRBC: 0 % (ref 0.0–0.2)

## 2023-02-14 LAB — BASIC METABOLIC PANEL
Anion gap: 7 (ref 5–15)
BUN: 32 mg/dL — ABNORMAL HIGH (ref 8–23)
CO2: 27 mmol/L (ref 22–32)
Calcium: 8.2 mg/dL — ABNORMAL LOW (ref 8.9–10.3)
Chloride: 102 mmol/L (ref 98–111)
Creatinine, Ser: 0.96 mg/dL (ref 0.44–1.00)
GFR, Estimated: >60 mL/min (ref >60)
Glucose, Bld: 114 mg/dL — ABNORMAL HIGH (ref 70–99)
Potassium: 4.7 mmol/L (ref 3.5–5.1)
Sodium: 136 mmol/L (ref 135–145)

## 2023-02-14 MED ORDER — FOLIC ACID 1 MG PO TABS
1.0000 mg | ORAL_TABLET | Freq: Every day | ORAL | Status: DC
  Administered 2023-02-14 – 2023-02-18 (×5): 1 mg via ORAL
  Filled 2023-02-14 (×5): qty 1

## 2023-02-14 MED ORDER — SODIUM CHLORIDE 0.9 % IV SOLN
1.0000 g | Freq: Every day | INTRAVENOUS | Status: DC
  Administered 2023-02-14 – 2023-02-15 (×2): 1 g via INTRAVENOUS
  Filled 2023-02-14 (×2): qty 10

## 2023-02-14 MED ORDER — FLUTICASONE PROPIONATE 50 MCG/ACT NA SUSP
2.0000 | Freq: Every day | NASAL | Status: DC
  Administered 2023-02-14 – 2023-02-18 (×6): 2 via NASAL
  Filled 2023-02-14: qty 16

## 2023-02-14 MED ORDER — ACETAMINOPHEN 325 MG PO TABS
650.0000 mg | ORAL_TABLET | Freq: Four times a day (QID) | ORAL | Status: DC | PRN
  Administered 2023-02-14 – 2023-02-18 (×11): 650 mg via ORAL
  Filled 2023-02-14 (×13): qty 2

## 2023-02-14 NOTE — TOC Progression Note (Addendum)
Transition of Care [facility]) - Progression Note    Patient Details  Name: Molly Leonard MRN: [id number] Date of Birth: 05/12/1946  Transition [facility]) CM/SW Contact  Liliana Cline, LCSW Phone Number: 02/14/2023, 9:22 AM  Clinical Narrative:    Awaiting bed offers and PASRR is pending.   2:53- Still awaiting bed offers and PASRR.  Expected Discharge Plan: Skilled Nursing Facility Barriers to Discharge: SNF Pending bed offer, Insurance Authorization, Continued Medical Work up  Expected Discharge Plan and Services       Living arrangements for the past 2 months: Single Family Home                                       Social Determinants of Health (SDOH) Interventions SDOH Screenings   Food Insecurity: No Food Insecurity (02/09/2023)  Housing: Low Risk  (02/09/2023)  Transportation Needs: No Transportation Needs (02/09/2023)  Utilities: Not At Risk (02/09/2023)  Alcohol Screen: Low Risk  (07/20/2022)  Depression (PHQ2-9): High Risk (07/20/2022)  Financial Resource Strain: Low Risk  (10/28/2021)  Physical Activity: Unknown (10/28/2021)  Social Connections: Moderately Isolated (10/28/2021)  Stress: No Stress Concern Present (10/28/2021)  Tobacco Use: High Risk (02/10/2023)    Readmission Risk Interventions     No data to display

## 2023-02-14 NOTE — Progress Notes (Signed)
Physical Therapy Treatment Patient Details Name: Molly Leonard MRN: [id number] DOB: [birth year] Today's Date: 02/14/2023   History of Present Illness Molly Leonard is a 77 y.o. female with a past medical history of anxiety, depression, asthma/COPD who presents after mechanical fall coming out of the house this morning tripping on her front step landing on her right hip. Patient is s/p R hemiarthroplasty, WBAT    PT Comments    Pt in bed.  Stated she has been cold all night and chilled.  She is a bit sweaty in bed.  Stated she needs to void.  Assisted to sitting with mod a x 1.  Upon sitting she c/o dizziness that does not clear with time.  She does not feel comfortable standing so sitting EOB bath is initiated.  She participated with upper body bathing in hopes of dizziness clearing.  She initially stated she felt better after bath and ok to transfer to [facility] but upon attempting to stand she stated she still does not feel good and is assisted to supine.  A bedpan is given to her and tech aware and is observed going into room a few minutes later.    Limited today due to general malaise and appearing generally feaverish. Tech to obtain vitals.  Will relay to RN.  May need to check orthostatic vitals next session if she does not feel better deferred today is it seemed more related to malaise than BP.   Recommendations for follow up therapy are one component of a multi-disciplinary discharge planning process, led by the attending physician.  Recommendations may be updated based on patient status, additional functional criteria and insurance authorization.  Follow Up Recommendations       Assistance Recommended at Discharge Frequent or constant Supervision/Assistance  Patient can return home with the following A little help with walking and/or transfers;A lot of help with bathing/dressing/bathroom;Assistance with cooking/housework;Direct supervision/assist for medications management;Direct supervision/assist  for financial management;Assist for transportation;Help with stairs or ramp for entrance   Equipment Recommendations  Other (comment) (defer to next level of care)    Recommendations for Other Services       Precautions / Restrictions Precautions Precautions: Fall Restrictions Weight Bearing Restrictions: Yes RLE Weight Bearing: Weight bearing as tolerated     Mobility  Bed Mobility Overal bed mobility: Needs Assistance Bed Mobility: Supine to Sit, Sit to Supine     Supine to sit: Mod assist Sit to supine: Mod assist        Transfers                   General transfer comment: unable due to dizziness    Ambulation/Gait                   Stairs             Wheelchair Mobility    Modified Rankin (Stroke Patients Only)       Balance Overall balance assessment: Needs assistance Sitting-balance support: Feet supported Sitting balance-Leahy Scale: Good                                      Cognition Arousal/Alertness: Awake/alert Behavior During Therapy: WFL for tasks assessed/performed Overall Cognitive Status: Within Functional Limits for tasks assessed  General Comments: seemed more orientated today based on notes yesterday but does seem a bit off from expected baseline cognition.        Exercises Other Exercises Other Exercises: upper body bathing with good participation by pt.    General Comments        Pertinent Vitals/Pain Pain Assessment Pain Assessment: Faces Faces Pain Scale: Hurts little more Pain Location: R LE Pain Descriptors / Indicators: Discomfort, Sore, Moaning, Grimacing, Guarding Pain Intervention(s): Limited activity within patient's tolerance, Monitored during session, Repositioned    Home Living                          Prior Function            PT Goals (current goals can now be found in the care plan section) Progress  towards PT goals: Progressing toward goals    Frequency    BID      PT Plan Current plan remains appropriate    Co-evaluation              AM-PAC PT "6 Clicks" Mobility   Outcome Measure  Help needed turning from your back to your side while in a flat bed without using bedrails?: A Little Help needed moving from lying on your back to sitting on the side of a flat bed without using bedrails?: A Lot Help needed moving to and from a bed to a chair (including a wheelchair)?: A Lot Help needed standing up from a chair using your arms (e.g., wheelchair or bedside chair)?: A Lot Help needed to walk in hospital room?: A Lot Help needed climbing 3-5 steps with a railing? : A Lot 6 Click Score: 13    End of Session Equipment Utilized During Treatment: Oxygen;Gait belt (2 L o2) Activity Tolerance: Patient limited by fatigue Patient left: in chair;with chair alarm set;with family/visitor present Nurse Communication: Mobility status PT Visit Diagnosis: Muscle weakness (generalized) (M62.81);Other abnormalities of gait and mobility (R26.89);Pain;Difficulty in walking, not elsewhere classified (R26.2);Unsteadiness on feet (R26.81);History of falling (Z91.81) Pain - Right/Left: Right Pain - part of body: Hip     Time: 1610-9604 PT Time Calculation (min) (ACUTE ONLY): 19 min  Charges:  $Therapeutic Activity: 8-22 mins                   Danielle Dess, PTA 02/14/23, 8:56 AM

## 2023-02-14 NOTE — Plan of Care (Signed)
  Problem: Clinical Measurements: Goal: Will remain free from infection Outcome: Progressing Goal: Respiratory complications will improve Outcome: Progressing Goal: Cardiovascular complication will be avoided Outcome: Progressing   Problem: Activity: Goal: Risk for activity intolerance will decrease Outcome: Progressing   Problem: Pain Managment: Goal: General experience of comfort will improve Outcome: Progressing   Problem: Safety: Goal: Ability to remain free from injury will improve Outcome: Progressing   Problem: Skin Integrity: Goal: Risk for impaired skin integrity will decrease Outcome: Progressing   Problem: Education: Goal: Knowledge of the prescribed therapeutic regimen will improve Outcome: Progressing

## 2023-02-14 NOTE — Plan of Care (Signed)
  Problem: Education: Goal: Knowledge of General Education information will improve Description: Including pain rating scale, medication(s)/side effects and non-pharmacologic comfort measures Outcome: Progressing   Problem: Health Behavior/Discharge Planning: Goal: Ability to manage health-related needs will improve Outcome: Progressing   Problem: Clinical Measurements: Goal: Ability to maintain clinical measurements within normal limits will improve Outcome: Progressing Goal: Will remain free from infection Outcome: Progressing Goal: Diagnostic test results will improve Outcome: Progressing Goal: Respiratory complications will improve Outcome: Progressing Goal: Cardiovascular complication will be avoided Outcome: Progressing   Problem: Activity: Goal: Risk for activity intolerance will decrease Outcome: Progressing   Problem: Nutrition: Goal: Adequate nutrition will be maintained Outcome: Progressing   Problem: Coping: Goal: Level of anxiety will decrease Outcome: Progressing   Problem: Elimination: Goal: Will not experience complications related to bowel motility Outcome: Progressing Goal: Will not experience complications related to urinary retention Outcome: Progressing   Problem: Pain Managment: Goal: General experience of comfort will improve Outcome: Progressing   Problem: Safety: Goal: Ability to remain free from injury will improve Outcome: Progressing   Problem: Skin Integrity: Goal: Risk for impaired skin integrity will decrease Outcome: Progressing   Problem: Education: Goal: Knowledge of the prescribed therapeutic regimen will improve Outcome: Progressing Goal: Understanding of discharge needs will improve Outcome: Progressing Goal: Individualized Educational Video(s) Outcome: Progressing   Problem: Activity: Goal: Ability to avoid complications of mobility impairment will improve Outcome: Progressing Goal: Ability to tolerate increased  activity will improve Outcome: Progressing   Problem: Clinical Measurements: Goal: Postoperative complications will be avoided or minimized Outcome: Progressing   Problem: Pain Management: Goal: Pain level will decrease with appropriate interventions Outcome: Progressing   Problem: Skin Integrity: Goal: Will show signs of wound healing Outcome: Progressing   Problem: Education: Goal: Verbalization of understanding the information provided (i.e., activity precautions, restrictions, etc) will improve Outcome: Progressing Goal: Individualized Educational Video(s) Outcome: Progressing   Problem: Activity: Goal: Ability to ambulate and perform ADLs will improve Outcome: Progressing   Problem: Clinical Measurements: Goal: Postoperative complications will be avoided or minimized Outcome: Progressing   Problem: Self-Concept: Goal: Ability to maintain and perform role responsibilities to the fullest extent possible will improve Outcome: Progressing   Problem: Pain Management: Goal: Pain level will decrease Outcome: Progressing   

## 2023-02-14 NOTE — Progress Notes (Addendum)
  Subjective: 5 Days Post-Op Procedure(s) (LRB): ARTHROPLASTY BIPOLAR HIP (HEMIARTHROPLASTY) (Right) Patient reports pain as mild.   Patient noted to have confusion. UA + for UTI. IV abx started Patient is well, and has had no acute complaints or problems. Denies any CP, SOB, ABD pain. - BM   Objective: Vital signs in last 24 hours: Temp:  [98.3 F (36.8 C)] 98.3 F (36.8 C) (06/02 0000) Pulse Rate:  [92-101] 101 (06/02 0000) Resp:  [16-18] 18 (06/02 0000) BP: (129)/(61-69) 129/61 (06/02 0000) SpO2:  [93 %-95 %] 93 % (06/02 0000)  Intake/Output from previous day: No intake/output data recorded. Intake/Output this shift: No intake/output data recorded.  Recent Labs    02/12/23 0408  HGB 8.8*   Recent Labs    02/12/23 0408  WBC 11.4*  RBC 2.74*  HCT 27.2*  PLT 294   Recent Labs    02/12/23 0408  NA 136  K 3.3*  CL 105  CO2 20*  BUN 35*  CREATININE 1.06*  GLUCOSE 123*  CALCIUM 8.4*   No results for input(s): "LABPT", "INR" in the last 72 hours.  EXAM General - Patient is Alert, Appropriate, and Oriented Extremity - Neurovascular intact Sensation intact distally Intact pulses distally Dorsiflexion/Plantar flexion intact Dressing - dressing C/D/I and no drainage Motor Function - intact, moving foot and toes well on exam.   Past Medical History:  Diagnosis Date   Anxiety    Depression     Assessment/Plan:   5 Days Post-Op Procedure(s) (LRB): ARTHROPLASTY BIPOLAR HIP (HEMIARTHROPLASTY) (Right) Principal Problem:   Closed right hip fracture, initial encounter [facility]) Active Problems:   Chronic obstructive pulmonary disease (HCC)   Esophagitis, reflux   Depression, recurrent (HCC)   Acute on chronic anemia   Fall at home, initial encounter   Displaced fracture of right femoral neck (HCC)   Chronic idiopathic constipation  Estimated body mass index is 21.93 kg/m as calculated from the following:   Height as of this encounter: 5\' 7"  (1.702 m).    Weight as of this encounter: 63.5 kg. Advance diet Up with therapy Pain well controlled Vital signs stable CBC, BMP pending Continue to work on BM UTI - IM treating with IV abx CM to assist with discharge, slow progress with PT. Will need SNF   Follow up with [facility] in 2 weeks Lovenox 40 mg SQ daily x 14 days at discharge   DVT Prophylaxis - Lovenox, TED hose, and SCDs Weight-Bearing as tolerated to right leg   T. Cranston Neighbor, PA-C [facility] 02/14/2023, 10:49 AM  Patient seen and examined, agree with above plan.  The patient is doing well status post right hip hemiarthroplasty, no concerns at this time. Small drop in H/H, no concern for ongoing bleeding or hematoma on exam, will recheck tomorrow.  Pain is controlled.  Discussed DVT prophylaxis, pain medication use, and safe transition to SNF with family.  All questions answered the patient and family agree with above plan.   Reinaldo Berber MD

## 2023-02-14 NOTE — Progress Notes (Signed)
  Progress Note   Patient: Molly LAHRMAN ZOX:[id number] DOB: Mar 18, 1946 DOA: 02/09/2023     5 DOS: the patient was seen and examined on 02/14/2023   Brief hospital course: MENAAL Leonard is a 77 y.o. female with a past medical history of anxiety, depression, asthma/COPD who presents after mechanical fall coming out of the house this morning tripping on her front step landing on her right hip.  Hip plain films positive for moderately displaced proximal right femoral neck fracture. She had Right hip cemented hemiarthroplasty 02/09/23. Awaiting short term rehab placement.  Assessment and Plan: * Hip fracture (HCC) S/p Right hip cemented hemiarthroplasty. Ortho follow up appreciated.  Out of bed to chair, WBAT, incentive spirometry. Monitor bowel/ bladder function. TOC working on short term rehab placement. Outpatient orthopedic follow up.  Fall at home, initial encounter Positive mechanical fall with patient tripping coming outside and landing on the right hip Continue fall precautions PT/ OT advised rehab. Out of bed to chair.   Acute on chronic Anemia Acute blood loss anemia  Hb around 8.3, no need for transfusion. Continue to monitor H/H.  Confusion, delirium- Abnormal Urinalysis, r.o UTI Avoid benzos, opiates, sedatives. UA abnormal, cultures sent. Will start Rocephin, follow culture results.  Esophagitis, reflux Continue daily PPI  Chronic obstructive pulmonary disease (HCC) No exacerbation.  Continue home inhalers as needed.  Tylenol PRN headaches. Will continue constipation regimen.  DVT prophylaxis -Lovenox daily x 14 per ortho team. Encourage out of bed. Nursing supportive care.     Subjective: Patient is seen and examined today morning. She is lying in bed, was confused last night. Eating poor. Complained of headache. Husband at bedside.   Physical Exam: Vitals:   02/12/23 2314 02/13/23 0817 02/13/23 1633 02/14/23 0000  BP: (!) 142/57 129/67 129/69 129/61  Pulse:  100 94 92 (!) 101  Resp: 20 18 16 18   Temp: 98.2 F (36.8 C) 97.9 F (36.6 C) 98.3 F (36.8 C) 98.3 F (36.8 C)  TempSrc:      SpO2: 95% 100% 95% 93%  Weight:      Height:       General- Elderly Caucasian ill female, in distress due to headache. Heart- S1, S2 heard, tachycardia noted. Lungs bilateral air entry good,, bibasal rales. Abdomen - distended, nontender, bowel sounds good. Neuro- AAO x3, non focal exam.  Data Reviewed:  CBC, BMP, Urinalysis  Family Communication: Patient and husband at bedside agreed with current care plan.   Disposition: Status is: Inpatient Remains inpatient appropriate because: safe discharge plan.  Planned Discharge Destination: short term rehab.     Code Status: Full Code  Time spent: 44 minutes  Author: Marcelino Duster, MD 02/14/2023 2:52 PM  For on call review [email or web address].

## 2023-02-15 LAB — CBC
HCT: 23.5 % — ABNORMAL LOW (ref 36.0–46.0)
Hemoglobin: 7.7 g/dL — ABNORMAL LOW (ref 12.0–15.0)
MCH: 32.5 pg (ref 26.0–34.0)
MCHC: 32.8 g/dL (ref 30.0–36.0)
MCV: 99.2 fL (ref 80.0–100.0)
Platelets: 337 10*3/uL (ref 150–400)
RBC: 2.37 MIL/uL — ABNORMAL LOW (ref 3.87–5.11)
RDW: 13.1 % (ref 11.5–15.5)
WBC: 9.2 10*3/uL (ref 4.0–10.5)
nRBC: 0 % (ref 0.0–0.2)

## 2023-02-15 LAB — URINE CULTURE

## 2023-02-15 LAB — SURGICAL PATHOLOGY

## 2023-02-15 MED ORDER — CEFUROXIME AXETIL 500 MG PO TABS
250.0000 mg | ORAL_TABLET | Freq: Two times a day (BID) | ORAL | Status: DC
  Administered 2023-02-16 – 2023-02-18 (×5): 250 mg via ORAL
  Filled 2023-02-15 (×5): qty 0.5
  Filled 2023-02-15 (×2): qty 1

## 2023-02-15 MED ORDER — FE FUM-VIT C-VIT B12-FA 460-60-0.01-1 MG PO CAPS
1.0000 | ORAL_CAPSULE | Freq: Every day | ORAL | Status: DC
  Administered 2023-02-15 – 2023-02-18 (×4): 1 via ORAL
  Filled 2023-02-15 (×4): qty 1

## 2023-02-15 MED ORDER — CEFUROXIME AXETIL 500 MG PO TABS
250.0000 mg | ORAL_TABLET | Freq: Two times a day (BID) | ORAL | Status: DC
  Filled 2023-02-15 (×2): qty 0.5
  Filled 2023-02-15: qty 1

## 2023-02-15 NOTE — NC FL2 (Signed)
[place] MEDICAID FL2 LEVEL OF CARE FORM     IDENTIFICATION  Patient Name: Molly Leonard Birthdate: November 03, 1945 Sex: female Admission Date (Current Location): 02/09/2023  [facility] and [place] Number:  Chiropodist and Address:  [facility], [address], [place], [place] [address]      Provider Number: [id number]  Attending Physician Name and Address:  Marcelino Duster, MD  Relative Name and Phone Number:  Alexies, [facility]) [phone] [facility] Phone)    Current Level of Care: Hospital Recommended Level of Care: Skilled Nursing Facility Prior Approval Number:    Date Approved/Denied:   PASRR Number: [id number]  Discharge Plan: SNF    Current Diagnoses: Patient Active Problem List   Diagnosis Date Noted   Other headache syndrome 02/14/2023   Chronic idiopathic constipation 02/13/2023   Closed right hip fracture, initial encounter (HCC) 02/09/2023   Fall at home, initial encounter 02/09/2023   Displaced fracture of right femoral neck (HCC) 02/09/2023   Acute on chronic anemia 03/18/2022   Upper respiratory tract infection 03/18/2022   White coat syndrome with hypertension 07/22/2021   Depression, recurrent (HCC) 07/22/2021   Dyspareunia in female 12/04/2015   Allergic rhinitis 10/22/2015   History of anemia 10/22/2015   Airway hyperreactivity 10/22/2015   Chronic obstructive pulmonary disease (HCC) 10/22/2015   Esophagitis, reflux 10/22/2015   Hypercholesterolemia 10/22/2015   Cannot sleep 10/22/2015   Arthritis, degenerative 10/22/2015   Current tobacco use 10/22/2015   Anxiety 05/21/2015    Orientation RESPIRATION BLADDER Height & Weight     Situation, Place, Self  O2 (2L) Indwelling catheter, Continent Weight: 63.5 kg Height:  5\' 7"  (170.2 cm)  BEHAVIORAL SYMPTOMS/MOOD NEUROLOGICAL BOWEL NUTRITION STATUS        Diet (regular)  AMBULATORY STATUS COMMUNICATION OF NEEDS Skin   Limited Assist Verbally Surgical wounds  (R hip)                       Personal Care Assistance Level of Assistance  Bathing, Feeding, Dressing Bathing Assistance: Limited assistance Feeding assistance: Limited assistance Dressing Assistance: Limited assistance     Functional Limitations Info             SPECIAL CARE FACTORS FREQUENCY  PT (By licensed PT), OT (By licensed OT)     PT Frequency: 5 times per week OT Frequency: 5 times per week            Contractures      Additional Factors Info  Code Status, Allergies Code Status Info: full Allergies Info: nka           Current Medications (02/15/2023):  This is the current hospital active medication list Current Facility-Administered Medications  Medication Dose Route Frequency Provider Last Rate Last Admin   acetaminophen (TYLENOL) tablet 650 mg  650 mg Oral Q6H PRN Marcelino Duster, MD   650 mg at 02/15/23 1155   albuterol (PROVENTIL) (2.5 MG/3ML) 0.083% nebulizer solution 2.5 mg  2.5 mg Inhalation Q6H PRN Floydene Flock, MD       ALPRAZolam Prudy Feeler) tablet 1 mg  1 mg Oral TID PRN Floydene Flock, MD   1 mg at 02/14/23 2123   bisacodyl (DULCOLAX) suppository 10 mg  10 mg Rectal Daily PRN Evon Slack, PA-C   10 mg at 02/14/23 0704   [START ON 02/16/2023] cefUROXime (CEFTIN) tablet 250 mg  250 mg Oral BID WC Angelique Blonder, [facility]  docusate sodium (COLACE) capsule 200 mg  200 mg Oral BID Marcelino Duster, MD   200 mg at 02/15/23 0902   enoxaparin (LOVENOX) injection 40 mg  40 mg Subcutaneous Q24H Reinaldo Berber, MD   40 mg at 02/15/23 0906   Fe Fum-Vit C-Vit B12-FA (TRIGELS-F FORTE) capsule 1 capsule  1 capsule Oral QPC breakfast Evon Slack, PA-C   1 capsule at 02/15/23 1146   FLUoxetine (PROZAC) capsule 40 mg  40 mg Oral Daily Floydene Flock, MD   40 mg at 02/15/23 0902   fluticasone (FLONASE) 50 MCG/ACT nasal spray 2 spray  2 spray Each Nare Daily Marcelino Duster, MD   2 spray at 02/15/23 1146   folic acid (FOLVITE)  tablet 1 mg  1 mg Oral Daily Marcelino Duster, MD   1 mg at 02/15/23 1610   menthol-cetylpyridinium (CEPACOL) lozenge 3 mg  1 lozenge Oral PRN Reinaldo Berber, MD       Or   phenol (CHLORASEPTIC) mouth spray 1 spray  1 spray Mouth/Throat PRN Reinaldo Berber, MD       metoCLOPramide (REGLAN) tablet 5-10 mg  5-10 mg Oral Q8H PRN Reinaldo Berber, MD       Or   metoCLOPramide (REGLAN) injection 5-10 mg  5-10 mg Intravenous Q8H PRN Reinaldo Berber, MD       ondansetron [facility]) tablet 4 mg  4 mg Oral Q6H PRN Reinaldo Berber, MD   4 mg at 02/13/23 0136   Or   ondansetron (ZOFRAN) injection 4 mg  4 mg Intravenous Q6H PRN Reinaldo Berber, MD   4 mg at 02/09/23 2133   pantoprazole (PROTONIX) EC tablet 20 mg  20 mg Oral Daily Floydene Flock, MD   20 mg at 02/15/23 0902   polyethylene glycol (MIRALAX / GLYCOLAX) packet 17 g  17 g Oral Daily Marcelino Duster, MD   17 g at 02/15/23 0902   senna (SENOKOT) tablet 17.2 mg  2 tablet Oral QHS Marcelino Duster, MD   17.2 mg at 02/14/23 2124     Discharge Medications: Please see discharge summary for a list of discharge medications.  Relevant Imaging Results:  Relevant Lab Results:   Additional Information SS #: [id number]  Garret Reddish, RN

## 2023-02-15 NOTE — Progress Notes (Signed)
Physical Therapy Treatment Patient Details Name: Molly Leonard MRN: [id number] DOB: [birth year] Today's Date: 02/15/2023   History of Present Illness Molly Leonard is a 77 y.o. female with a past medical history of anxiety, depression, asthma/COPD who presents after mechanical fall coming out of the house this morning tripping on her front step landing on her right hip. Patient is s/p R hemiarthroplasty, WBAT with posterior hip precautions.    PT Comments    Pt found on 2LO2/min with SpO2 98%. OK per nsg for room air trial with SpO2 dropping to a low of 88% during functional mobility per below with pt returned to 2LO2 at end of session, nsg notified.  Pt reported some mild dizziness during the session with BP taken in sitting at 139/71 and again in standing at 143/67, HR WNL.  Dizziness did not seem to worsen or improve based on position and remained mild during the session.  Pt ultimately was able to stand with assist and amb a max of 10 feet before fatiguing and needing to return to sitting.  Pt will benefit from continued PT services upon discharge to safely address deficits listed in patient problem list for decreased caregiver assistance and eventual return to PLOF.   Recommendations for follow up therapy are one component of a multi-disciplinary discharge planning process, led by the attending physician.  Recommendations may be updated based on patient status, additional functional criteria and insurance authorization.  Follow Up Recommendations  Can patient physically be transported by private vehicle: No    Assistance Recommended at Discharge Frequent or constant Supervision/Assistance  Patient can return home with the following A little help with walking and/or transfers;A lot of help with bathing/dressing/bathroom;Assistance with cooking/housework;Direct supervision/assist for medications management;Direct supervision/assist for financial management;Assist for transportation;Help with stairs or  ramp for entrance   Equipment Recommendations  Other (comment) (TBD at next venue of care)    Recommendations for Other Services       Precautions / Restrictions Precautions Precautions: Fall;Posterior Hip Restrictions Weight Bearing Restrictions: Yes RLE Weight Bearing: Weight bearing as tolerated     Mobility  Bed Mobility               General bed mobility comments: NT, in recliner    Transfers Overall transfer level: Needs assistance Equipment used: Rolling walker (2 wheels) Transfers: Sit to/from Stand Sit to Stand: Mod assist           General transfer comment: Mod A and mod verbal and tactile cues for sequencing for hip precaution compliance    Ambulation/Gait Ambulation/Gait assistance: Min assist Gait Distance (Feet): 10 Feet x 1, 3 Feet x 1 Assistive device: Rolling walker (2 wheels) Gait Pattern/deviations: Step-to pattern, Antalgic, Decreased stance time - right, Decreased step length - left Gait velocity: decreased     General Gait Details: Mod verbal cues for step-to sequencing for R hip weakness/pain control   Stairs             Wheelchair Mobility    Modified Rankin (Stroke Patients Only)       Balance Overall balance assessment: Needs assistance Sitting-balance support: Feet supported Sitting balance-Leahy Scale: Good     Standing balance support: Bilateral upper extremity supported, During functional activity, Reliant on assistive device for balance Standing balance-Leahy Scale: Fair                              Cognition Arousal/Alertness: Awake/alert Behavior During  Therapy: WFL for tasks assessed/performed Overall Cognitive Status: Within Functional Limits for tasks assessed                                          Exercises Total Joint Exercises Ankle Circles/Pumps: AROM, Both, 10 reps, Strengthening Quad Sets: Strengthening, Both, 10 reps Hip ABduction/ADduction: AAROM,  Strengthening, Both, 5 reps Straight Leg Raises: AAROM, Strengthening, Both, 5 reps Long Arc Quad: AROM, Strengthening, Both, 10 reps Knee Flexion: AROM, Strengthening, Both, 10 reps Marching in Standing: Strengthening, Both, 5 reps, Standing Other Exercises Other Exercises: Post hip precaution education and review Other Exercises: 180 deg R turn training to prevent CKC R hip IR    General Comments        Pertinent Vitals/Pain Pain Assessment Pain Assessment: 0-10 Pain Score: 6  Pain Location: R hip Pain Descriptors / Indicators: Aching, Sore Pain Intervention(s): Repositioned, Premedicated before session, Monitored during session    Home Living                          Prior Function            PT Goals (current goals can now be found in the care plan section) Progress towards PT goals: Progressing toward goals    Frequency    BID      PT Plan Current plan remains appropriate    Co-evaluation              AM-PAC PT "6 Clicks" Mobility   Outcome Measure  Help needed turning from your back to your side while in a flat bed without using bedrails?: A Little Help needed moving from lying on your back to sitting on the side of a flat bed without using bedrails?: A Lot Help needed moving to and from a bed to a chair (including a wheelchair)?: A Lot Help needed standing up from a chair using your arms (e.g., wheelchair or bedside chair)?: A Lot Help needed to walk in hospital room?: A Lot Help needed climbing 3-5 steps with a railing? : Total 6 Click Score: 12    End of Session Equipment Utilized During Treatment: Gait belt;Oxygen Activity Tolerance: Patient tolerated treatment well Patient left: in chair;with chair alarm set;with family/visitor present;with call bell/phone within reach;with nursing/sitter in room;Other (comment);with SCD's reapplied (Pt left with nursing for new IV placement) Nurse Communication: Mobility status;Weight bearing  status;Precautions PT Visit Diagnosis: Muscle weakness (generalized) (M62.81);Other abnormalities of gait and mobility (R26.89);Pain;Difficulty in walking, not elsewhere classified (R26.2);Unsteadiness on feet (R26.81);History of falling (Z91.81) Pain - part of body: Hip     Time: 1005-1037 PT Time Calculation (min) (ACUTE ONLY): 32 min  Charges:  $Therapeutic Exercise: 8-22 mins $Therapeutic Activity: 8-22 mins                     D. Scott Quanta Roher PT, DPT 02/15/23, 12:00 PM

## 2023-02-15 NOTE — Plan of Care (Signed)
?  Problem: Clinical Measurements: ?Goal: Ability to maintain clinical measurements within normal limits will improve ?Outcome: Progressing ?Goal: Will remain free from infection ?Outcome: Progressing ?  ?Problem: Nutrition: ?Goal: Adequate nutrition will be maintained ?Outcome: Progressing ?  ?Problem: Coping: ?Goal: Level of anxiety will decrease ?Outcome: Progressing ?  ?

## 2023-02-15 NOTE — Progress Notes (Signed)
  Progress Note   Patient: Molly Leonard:[id number] DOB: September 12, 1946 DOA: 02/09/2023     6 DOS: the patient was seen and examined on 02/15/2023   Brief hospital course: Molly Leonard is a 77 y.o. female with a past medical history of anxiety, depression, asthma/COPD who presents after mechanical fall coming out of the house this morning tripping on her front step landing on her right hip.  Hip plain films positive for moderately displaced proximal right femoral neck fracture. She had Right hip cemented hemiarthroplasty 02/09/23. Awaiting short term rehab placement.  Assessment and Plan: * Hip fracture (HCC) S/p Right hip cemented hemiarthroplasty. Out of bed to chair, WBAT, incentive spirometry. Monitor bowel/ bladder function. TOC working on short term rehab placement. Outpatient orthopedic follow up. Continue Lovenox 14 days post op.  Fall at home, initial encounter Positive mechanical fall with patient tripping coming outside and landing on the right hip Continue fall precautions PT/ OT advised rehab. Out of bed to chair.   Acute on chronic Anemia Acute blood loss anemia  Hb around 8.3, no need for transfusion. Continue to monitor H/H.  Confusion, delirium- Abnormal Urinalysis, r/o UTI Avoid benzos, opiates, sedatives. UA abnormal, cultures pending Rocephin changed to Ceftin, follow culture results.  Esophagitis, reflux Continue daily PPI  Chronic obstructive pulmonary disease (HCC) No exacerbation.  Continue home inhalers as needed.  Tylenol PRN headaches. Continue constipation regimen.  DVT prophylaxis -Lovenox daily x 14 per ortho team. Encourage out of bed. Nursing supportive care.     Subjective: Patient is seen and examined today morning. She is sitting in chair. Eating better. Moved her bowels, voiding good. Husband at bedside states she is less confused today.  Physical Exam: Vitals:   02/13/23 1633 02/14/23 0000 02/14/23 1653 02/15/23 0721  BP: 129/69 129/61  (!) 124/54 138/80  Pulse: 92 (!) 101 91 91  Resp: 16 18 16 16   Temp: 98.3 F (36.8 C) 98.3 F (36.8 C) 98.3 F (36.8 C) 98 F (36.7 C)  TempSrc:    Oral  SpO2: 95% 93% 100% 99%  Weight:      Height:       General- Elderly Caucasian ill female, in distress due to headache. Heart- S1, S2 heard, tachycardia noted. Lungs bilateral air entry good,, bibasal rales. Abdomen - distended, nontender, bowel sounds good. Neuro- AAO x3, non focal exam.  Data Reviewed:  CBC, BMP, Urinalysis  Family Communication: Patient and husband at bedside agreed with current care plan.   Disposition: Status is: Inpatient Remains inpatient appropriate because: safe discharge plan.  Planned Discharge Destination: short term rehab.     Code Status: Full Code  Time spent: 42 minutes  Author: Marcelino Duster, MD 02/15/2023 2:42 PM  For on call review [email or web address].

## 2023-02-15 NOTE — Care Management Important Message (Signed)
Important Message  Patient Details  Name: Molly Leonard MRN: [id number] Date of Birth: 07/30/1946   Medicare Important Message Given:  Yes     Olegario Messier A Maiya Kates 02/15/2023, 2:56 PM

## 2023-02-15 NOTE — Progress Notes (Addendum)
  Subjective: 6 Days Post-Op Procedure(s) (LRB): ARTHROPLASTY BIPOLAR HIP (HEMIARTHROPLASTY) (Right) Patient reports pain as mild.   No complaints Patient is well, and has had no acute complaints or problems. Denies any CP, SOB, ABD pain. + BM   Objective: Vital signs in last 24 hours: Temp:  [98 F (36.7 C)-98.3 F (36.8 C)] 98 F (36.7 C) (06/03 0721) Pulse Rate:  [91] 91 (06/03 0721) Resp:  [16] 16 (06/03 0721) BP: (124-138)/(54-80) 138/80 (06/03 0721) SpO2:  [99 %-100 %] 99 % (06/03 0721)  Intake/Output from previous day: 06/02 0701 - 06/03 0700 In: 2314.5 [I.V.:2214.5; IV Piggyback:100] Out: -  Intake/Output this shift: No intake/output data recorded.  Recent Labs    02/14/23 1102 02/15/23 0428  HGB 8.3* 7.7*   Recent Labs    02/14/23 1102 02/15/23 0428  WBC 9.7 9.2  RBC 2.55* 2.37*  HCT 25.5* 23.5*  PLT 373 337   Recent Labs    02/14/23 1102  NA 136  K 4.7  CL 102  CO2 27  BUN 32*  CREATININE 0.96  GLUCOSE 114*  CALCIUM 8.2*   No results for input(s): "LABPT", "INR" in the last 72 hours.  EXAM General - Patient is Alert, Appropriate, and Oriented Extremity - Neurovascular intact Sensation intact distally Intact pulses distally Dorsiflexion/Plantar flexion intact Dressing - dressing C/D/I and no drainage Motor Function - intact, moving foot and toes well on exam.   Past Medical History:  Diagnosis Date   Anxiety    Depression     Assessment/Plan:   6 Days Post-Op Procedure(s) (LRB): ARTHROPLASTY BIPOLAR HIP (HEMIARTHROPLASTY) (Right) Principal Problem:   Closed right hip fracture, initial encounter [facility]) Active Problems:   Chronic obstructive pulmonary disease (HCC)   Esophagitis, reflux   Depression, recurrent (HCC)   Acute on chronic anemia   Fall at home, initial encounter   Displaced fracture of right femoral neck (HCC)   Chronic idiopathic constipation   Other headache syndrome  Estimated body mass index is 21.93 kg/m  as calculated from the following:   Height as of this encounter: 5\' 7"  (1.702 m).   Weight as of this encounter: 63.5 kg. Advance diet Up with therapy Pain well controlled Vital signs stable Acute post op blood loss anemia - Hgb 7.7, slight drog. Continue with oral Fe UTI - IM treating with IV abx CM to assist with discharge, slow progress with PT. Will need SNF   Follow up with [facility] in 2 weeks Lovenox 40 mg SQ daily x 14 days at discharge   DVT Prophylaxis - Lovenox, TED hose, and SCDs Weight-Bearing as tolerated to right leg   T. Cranston Neighbor, PA-C [facility] 02/15/2023, 10:12 AM  Patient seen and examined, agree with above plan.  The patient is doing well status post right hip hemiarthroplasty, slight drop in hemoglobin vitals stable.  Pain is controlled.  Discussed DVT prophylaxis, pain medication use, and safe transition to SNF.  All questions answered.   Reinaldo Berber MD

## 2023-02-15 NOTE — Progress Notes (Signed)
Physical Therapy Treatment Patient Details Name: Molly Leonard MRN: [id number] DOB: 03-21-1946 Today's Date: 02/15/2023   History of Present Illness Molly Leonard is a 77 y.o. female with a past medical history of anxiety, depression, asthma/COPD who presents after mechanical fall coming out of the house this morning tripping on her front step landing on her right hip. Patient is s/p R hemiarthroplasty, WBAT with posterior hip precautions.    PT Comments    Pt was pleasant and motivated to participate in therapy. She was found sitting in chair with family present. Pt still made note of dizziness and pain in R hip, but stated she felt a little better than this morning. She still required mod assist for STS with heavy cueing for proper foot position for posterior hip precautions and to push off with hands on chair. Pt able to amb. ~50 feet with RW and min assist. She required one 3-4 min seated rest break, after which she was able to walk back to her room. Vitals remained stable throughout the entirety of the session, with O2 remaining around the upper 90's on 2L O2, and HR remaining around low 90s to low 100's. Pt will benefit from continued PT services upon discharge to safely address deficits listed in patient problem list for decreased caregiver assistance and eventual return to PLOF.   Recommendations for follow up therapy are one component of a multi-disciplinary discharge planning process, led by the attending physician.  Recommendations may be updated based on patient status, additional functional criteria and insurance authorization.  Follow Up Recommendations  Can patient physically be transported by private vehicle: No    Assistance Recommended at Discharge Frequent or constant Supervision/Assistance  Patient can return home with the following A little help with walking and/or transfers;A lot of help with bathing/dressing/bathroom;Assistance with cooking/housework;Direct supervision/assist for  medications management;Direct supervision/assist for financial management;Assist for transportation;Help with stairs or ramp for entrance   Equipment Recommendations  Other (comment) (TBD at next venue of care)    Recommendations for Other Services       Precautions / Restrictions Precautions Precautions: Fall;Posterior Hip Restrictions Weight Bearing Restrictions: Yes RLE Weight Bearing: Weight bearing as tolerated     Mobility  Bed Mobility                    Transfers Overall transfer level: Needs assistance Equipment used: Rolling walker (2 wheels) Transfers: Sit to/from Stand Sit to Stand: Mod assist           General transfer comment: Mod A and mod verbal and tactile cues for sequencing for hip precaution compliance    Ambulation/Gait Ambulation/Gait assistance: Min assist Gait Distance (Feet): 50 Feet Assistive device: Rolling walker (2 wheels) Gait Pattern/deviations: Step-to pattern, Antalgic, Decreased stance time - right, Decreased step length - left Gait velocity: decreased     General Gait Details: Mod verbal cues for step-to sequencing for R hip weakness/pain control as well as for proper use of RW   Stairs             Wheelchair Mobility    Modified Rankin (Stroke Patients Only)       Balance Overall balance assessment: Needs assistance Sitting-balance support: Feet supported Sitting balance-Leahy Scale: Good     Standing balance support: Bilateral upper extremity supported, During functional activity, Reliant on assistive device for balance Standing balance-Leahy Scale: Fair  Cognition Arousal/Alertness: Awake/alert Behavior During Therapy: WFL for tasks assessed/performed Overall Cognitive Status: Within Functional Limits for tasks assessed                                          Exercises Other Exercises Other Exercises: Post hip precaution education and  review Other Exercises: 180 deg R turn training to prevent CKC R hip IR    General Comments        Pertinent Vitals/Pain Pain Assessment Pain Assessment: 0-10 Pain Score: 6  Pain Location: R hip    Home Living                          Prior Function            PT Goals (current goals can now be found in the care plan section) Progress towards PT goals: Progressing toward goals    Frequency    BID      PT Plan Current plan remains appropriate    Co-evaluation              AM-PAC PT "6 Clicks" Mobility   Outcome Measure  Help needed turning from your back to your side while in a flat bed without using bedrails?: A Little Help needed moving from lying on your back to sitting on the side of a flat bed without using bedrails?: A Lot Help needed moving to and from a bed to a chair (including a wheelchair)?: A Lot Help needed standing up from a chair using your arms (e.g., wheelchair or bedside chair)?: A Lot Help needed to walk in hospital room?: A Lot Help needed climbing 3-5 steps with a railing? : Total 6 Click Score: 12    End of Session Equipment Utilized During Treatment: Gait belt;Oxygen Activity Tolerance: Patient tolerated treatment well Patient left: in chair;with chair alarm set;with family/visitor present;with call bell/phone within reach;with SCD's reapplied Nurse Communication: Mobility status;Weight bearing status;Precautions PT Visit Diagnosis: Muscle weakness (generalized) (M62.81);Other abnormalities of gait and mobility (R26.89);Pain;Difficulty in walking, not elsewhere classified (R26.2);Unsteadiness on feet (R26.81);History of falling (Z91.81) Pain - Right/Left: Right Pain - part of body: Hip     Time: 1429-1501 PT Time Calculation (min) (ACUTE ONLY): 32 min  Charges:  $Therapeutic Exercise: 8-22 mins $Therapeutic Activity: 8-22 mins                     Kyi Romanello, SPT 02/15/23, 5:08 PM

## 2023-02-16 DIAGNOSIS — B9620 Unspecified Escherichia coli [E. coli] as the cause of diseases classified elsewhere: Secondary | ICD-10-CM

## 2023-02-16 DIAGNOSIS — N390 Urinary tract infection, site not specified: Secondary | ICD-10-CM

## 2023-02-16 LAB — URINE CULTURE: Culture: >=100000 — AB

## 2023-02-16 LAB — CBC
HCT: 26.5 % — ABNORMAL LOW (ref 36.0–46.0)
Hemoglobin: 8.6 g/dL — ABNORMAL LOW (ref 12.0–15.0)
MCH: 32.3 pg (ref 26.0–34.0)
MCHC: 32.5 g/dL (ref 30.0–36.0)
MCV: 99.6 fL (ref 80.0–100.0)
Platelets: 419 10*3/uL — ABNORMAL HIGH (ref 150–400)
RBC: 2.66 MIL/uL — ABNORMAL LOW (ref 3.87–5.11)
RDW: 13 % (ref 11.5–15.5)
WBC: 11.5 10*3/uL — ABNORMAL HIGH (ref 4.0–10.5)
nRBC: 0 % (ref 0.0–0.2)

## 2023-02-16 LAB — VITAMIN B12: Vitamin B-12: 161 pg/mL — ABNORMAL LOW (ref 180–914)

## 2023-02-16 LAB — FOLATE: Folate: >40 ng/mL (ref >5.9)

## 2023-02-16 LAB — FERRITIN: Ferritin: 256 ng/mL (ref 11–307)

## 2023-02-16 LAB — IRON AND TIBC
Iron: 38 ug/dL (ref 28–170)
Saturation Ratios: 14 % (ref 10.4–31.8)
TIBC: 277 ug/dL (ref 250–450)
UIBC: 239 ug/dL

## 2023-02-16 MED ORDER — MELATONIN 5 MG PO TABS
5.0000 mg | ORAL_TABLET | Freq: Every day | ORAL | Status: DC
  Administered 2023-02-16 – 2023-02-17 (×2): 5 mg via ORAL
  Filled 2023-02-16 (×2): qty 1

## 2023-02-16 NOTE — Progress Notes (Signed)
Physical Therapy Treatment Patient Details Name: Molly Leonard MRN: [id number] DOB: February 14, 1946 Today's Date: 02/16/2023   History of Present Illness Molly Leonard is a 77 y.o. female with a past medical history of anxiety, depression, asthma/COPD who presents after mechanical fall coming out of the house this morning tripping on her front step landing on her right hip. Patient is s/p R hemiarthroplasty, WBAT with posterior hip precautions.    PT Comments    Pt was lying in bed upon arrival, reported feeling extremely cold so she requested to keep session confined to bed. Education provided on benefits of mobility/ambulation, pt continued to decline. HR and O2 remained WNL and stable throughout the entirety of the session, HR staying in the mid to high 90's and O2 remaining in the 90's. Pt was able to perform some supine and seated EOB therex with no adverse responses other than pain in R hip/bottom. Pt required mod assist and cueing to go from supine<>sit to ensure adherence to posterior hip precautions. Pt will benefit from continued PT services upon discharge to safely address deficits listed in patient problem list for decreased caregiver assistance and eventual return to PLOF.   Recommendations for follow up therapy are one component of a multi-disciplinary discharge planning process, led by the attending physician.  Recommendations may be updated based on patient status, additional functional criteria and insurance authorization.  Follow Up Recommendations  Can patient physically be transported by private vehicle: No    Assistance Recommended at Discharge Frequent or constant Supervision/Assistance  Patient can return home with the following A little help with walking and/or transfers;A lot of help with bathing/dressing/bathroom;Assistance with cooking/housework;Direct supervision/assist for medications management;Direct supervision/assist for financial management;Assist for transportation;Help  with stairs or ramp for entrance   Equipment Recommendations  Other (comment)    Recommendations for Other Services       Precautions / Restrictions Precautions Precautions: Fall;Posterior Hip Restrictions Weight Bearing Restrictions: No RLE Weight Bearing: Weight bearing as tolerated     Mobility  Bed Mobility Overal bed mobility: Needs Assistance Bed Mobility: Supine to Sit, Sit to Supine     Supine to sit: Mod assist Sit to supine: Mod assist, +2 for physical assistance   General bed mobility comments: Increased time and effort, cueing for posterior hip precautions    Transfers Overall transfer level:  Equipment used:  Transfers:  Sit to Stand:            General transfer comment: No transfers today as pt wanted to remain in bed for session    Ambulation/Gait Ambulation/Gait assistance:  Gait Distance (Feet):  Assistive device:  Gait Pattern/deviations: Gait velocity:      General Gait Details:   Stairs             Wheelchair Mobility    Modified Rankin (Stroke Patients Only)       Balance Overall balance assessment: Needs assistance Sitting-balance support: Feet supported, No upper extremity supported Sitting balance-Leahy Scale: Good     Standing balance support:  Standing balance-Leahy Scale                              Cognition Arousal/Alertness: Awake/alert Behavior During Therapy: WFL for tasks assessed/performed Overall Cognitive Status: Within Functional Limits for tasks assessed  Exercises Total Joint Exercises Ankle Circles/Pumps: Strengthening, Both, AROM, Supine, 20 reps (2 x 10 with manual resistance) Hip ABduction/ADduction: AAROM, Both, 20 reps (Pt reported pain with R hip abd. 2 x 10) Long Arc Quad: AROM, Strengthening, Both, 20 reps (2 x 10) Other Exercises Other Exercises: Post hip precaution education and review Other Exercises:      General Comments General comments (skin integrity, edema, etc.): Pt continues to require cueing for posterior hip precautions. Pt reported being extremely cold to the point where she didn't want to get OOB.      Pertinent Vitals/Pain Pain Assessment Pain Assessment: 0-10 Pain Score:  (Pt unable to give number but continues to report soreness in R hip as well as headache) Pain Location: R hip Pain Descriptors / Indicators: Aching, Sore Pain Intervention(s): Limited activity within patient's tolerance, Monitored during session    Home Living                          Prior Function            PT Goals (current goals can now be found in the care plan section) Progress towards PT goals: Progressing toward goals    Frequency    BID      PT Plan Current plan remains appropriate    Co-evaluation              AM-PAC PT "6 Clicks" Mobility   Outcome Measure  Help needed turning from your back to your side while in a flat bed without using bedrails?: A Little Help needed moving from lying on your back to sitting on the side of a flat bed without using bedrails?: A Lot Help needed moving to and from a bed to a chair (including a wheelchair)?: A Lot Help needed standing up from a chair using your arms (e.g., wheelchair or bedside chair)?: A Lot Help needed to walk in hospital room?: A Lot Help needed climbing 3-5 steps with a railing? : Total 6 Click Score: 12    End of Session Equipment Utilized During Treatment: Gait belt Activity Tolerance: Patient limited by pain;Patient tolerated treatment well Patient left: in bed;with bed alarm set;with SCD's reapplied;with call bell/phone within reach;with family/visitor present;with nursing/sitter in room (Bed alarm set at lowest sensitivity due to constant error when using other options) Nurse Communication: Mobility status;Weight bearing status;Precautions PT Visit Diagnosis: Muscle weakness (generalized)  (M62.81);Other abnormalities of gait and mobility (R26.89);Pain;Difficulty in walking, not elsewhere classified (R26.2);Unsteadiness on feet (R26.81);History of falling (Z91.81) Pain - Right/Left: Right Pain - part of body: Hip     Time: 1610-9604 PT Time Calculation (min) (ACUTE ONLY): 35 min  Charges:                        Cena Benton, SPT 02/16/23, 4:38 PM

## 2023-02-16 NOTE — Progress Notes (Signed)
  Subjective: 7 Days Post-Op Procedure(s) (LRB): ARTHROPLASTY BIPOLAR HIP (HEMIARTHROPLASTY) (Right) Patient reports pain as mild.   No complaints Husband at bedside. Husband states mental status back to baseline. Patient with no confusion. No UTI symptoms Patient is well, and has had no acute complaints or problems. Denies any CP, SOB, ABD pain. + BM   Objective: Vital signs in last 24 hours: Temp:  [97.8 F (36.6 C)-98.2 F (36.8 C)] 98.2 F (36.8 C) (06/04 0711) Pulse Rate:  [85-94] 85 (06/04 0711) Resp:  [18] 18 (06/04 0711) BP: (137-155)/(65-72) 155/70 (06/04 0746) SpO2:  [100 %] 100 % (06/04 0711)  Intake/Output from previous day: 06/03 0701 - 06/04 0700 In: 360 [P.O.:360] Out: -  Intake/Output this shift: No intake/output data recorded.  Recent Labs    02/14/23 1102 02/15/23 0428  HGB 8.3* 7.7*   Recent Labs    02/14/23 1102 02/15/23 0428  WBC 9.7 9.2  RBC 2.55* 2.37*  HCT 25.5* 23.5*  PLT 373 337   Recent Labs    02/14/23 1102  NA 136  K 4.7  CL 102  CO2 27  BUN 32*  CREATININE 0.96  GLUCOSE 114*  CALCIUM 8.2*   No results for input(s): "LABPT", "INR" in the last 72 hours.  EXAM General - Patient is Alert, Appropriate, and Oriented Extremity - Neurovascular intact Sensation intact distally Intact pulses distally Dorsiflexion/Plantar flexion intact Dressing - dressing C/D/I and no drainage Motor Function - intact, moving foot and toes well on exam.   Past Medical History:  Diagnosis Date   Anxiety    Depression     Assessment/Plan:   7 Days Post-Op Procedure(s) (LRB): ARTHROPLASTY BIPOLAR HIP (HEMIARTHROPLASTY) (Right) Principal Problem:   Closed right hip fracture, initial encounter [facility]) Active Problems:   Chronic obstructive pulmonary disease (HCC)   Esophagitis, reflux   Depression, recurrent (HCC)   Acute on chronic anemia   Fall at home, initial encounter   Displaced fracture of right femoral neck (HCC)   Chronic  idiopathic constipation   Other headache syndrome  Estimated body mass index is 21.93 kg/m as calculated from the following:   Height as of this encounter: 5\' 7"  (1.702 m).   Weight as of this encounter: 63.5 kg. Advance diet Up with therapy Pain well controlled Vital signs stable Acute post op blood loss anemia - Hgb 7.7, CBC pending today. Continue with oral Fe. Patient asymptomatic. VSS UTI - IM treating with IV abx. Mental status back to baseline CM to assist with discharge, slow progress with PT. Will need SNF   Follow up with [facility] in 2 weeks Lovenox 40 mg SQ daily x 14 days at discharge   DVT Prophylaxis - Lovenox, TED hose, and SCDs Weight-Bearing as tolerated to right leg   T. Cranston Neighbor, PA-C [facility] 02/16/2023, 8:16 AM

## 2023-02-16 NOTE — Plan of Care (Signed)
  Problem: Clinical Measurements: Goal: Will remain free from infection Outcome: Progressing Goal: Diagnostic test results will improve Outcome: Progressing Goal: Respiratory complications will improve Outcome: Progressing   Problem: Activity: Goal: Risk for activity intolerance will decrease Outcome: Progressing   Problem: Nutrition: Goal: Adequate nutrition will be maintained Outcome: Progressing   Problem: Coping: Goal: Level of anxiety will decrease Outcome: Progressing   

## 2023-02-16 NOTE — Consult Note (Signed)
[facility] Customer service manager [facility]) Accountable Care Organization (ACO) [facility] Liaison Note  02/16/2023  [name] [birth year] [id number]  Location: [facility] RN Hospital Liaison screened the patient remotely at [facility]  Insurance: [employer] Molly Leonard is a 77 y.o. female who is a Primary Care Patient of [facility], [facility]. The patient was screened for readmission hospitalization with noted medium risk score for unplanned readmission risk with 1 IP in 6 months.  The patient was assessed for potential [facility]) Care Management service needs for post hospital transition for care coordination. Review of patient's electronic medical record reveals patient was admitted for closed right hip fracture.  Currently pending STR for SNF level of care. Pending bed offers at this time. Will alert [facility] PAC-RN if amongst those facility [facility] follows. If not a facility [facility] follows the SNF will address pt's ongoing needs.   Plan: [facility] Liaison will continue to follow progress and disposition to asess for post hospital community care coordination/management needs.  Referral request for community care coordination: pending disposition.   The [facility] Care Management/Population Health does not replace or interfere with any arrangements made by the Inpatient Transition of Care team.   For questions contact:   Elliot Cousin, RN, BSN [facility] Liaison [place]   [facility]  Population Health Office Hours MTWF  8:00 am-6:00 pm Off on Thursday [phone] mobile [phone] [Office toll free line][facility] Office Hours are M-F 8:30 - 5 pm 24 hour nurse advise line [phone] Concierge  [email or web address] [email or web address]

## 2023-02-16 NOTE — Progress Notes (Signed)
Physical Therapy Treatment Patient Details Name: Molly Leonard MRN: [id number] DOB: [birth year] Today's Date: 02/16/2023   History of Present Illness Molly Leonard is a 77 y.o. female with a past medical history of anxiety, depression, asthma/COPD who presents after mechanical fall coming out of the house this morning tripping on her front step landing on her right hip. Patient is s/p R hemiarthroplasty, WBAT with posterior hip precautions.    PT Comments    Pt in chair upon arrival and mentioned having trouble sleeping last night, nursing notified. Pt did well with transfers and ambulation today, requiring only min assist and RW for both. Vitals were WNL with O2 at 96% and HR at 83 bpm upon arrival. Pt remained on room air throughout bout of ambulation with O2 dropping to a low of 89%, which recovered to low 90's within 20-30 seconds of sitting. She continues to require frequent cueing and demonstrations to adhere to posterior hip precautions while ambulating and STS. Pt will benefit from continued PT services upon discharge to safely address deficits listed in patient problem list for decreased caregiver assistance and eventual return to PLOF.   Recommendations for follow up therapy are one component of a multi-disciplinary discharge planning process, led by the attending physician.  Recommendations may be updated based on patient status, additional functional criteria and insurance authorization.  Follow Up Recommendations  Can patient physically be transported by private vehicle: No    Assistance Recommended at Discharge Frequent or constant Supervision/Assistance  Patient can return home with the following A little help with walking and/or transfers;A lot of help with bathing/dressing/bathroom;Assistance with cooking/housework;Direct supervision/assist for medications management;Direct supervision/assist for financial management;Assist for transportation;Help with stairs or ramp for entrance    Equipment Recommendations  Other (comment)    Recommendations for Other Services       Precautions / Restrictions Precautions Precautions: Fall;Posterior Hip Restrictions Weight Bearing Restrictions: No RLE Weight Bearing: Weight bearing as tolerated     Mobility  Bed Mobility                    Transfers Overall transfer level: Needs assistance Equipment used: Rolling walker (2 wheels) Transfers: Sit to/from Stand Sit to Stand: Min assist           General transfer comment: Min A still requiring mod verbal and tactile cues for sequencing for hip precaution compliance    Ambulation/Gait Ambulation/Gait assistance: Min assist Gait Distance (Feet): 1 x 50 feet, 1 x 50 feet Assistive device: Rolling walker (2 wheels) Gait Pattern/deviations: Step-to pattern, Antalgic, Decreased stance time - right, Decreased step length - left Gait velocity: decreased     General Gait Details: Mod verbal cues for step-to sequencing for R hip weakness/pain control as well as for proper use of RW   Stairs             Wheelchair Mobility    Modified Rankin (Stroke Patients Only)       Balance Overall balance assessment: Needs assistance Sitting-balance support: Feet supported Sitting balance-Leahy Scale: Good     Standing balance support: Bilateral upper extremity supported, During functional activity, Reliant on assistive device for balance Standing balance-Leahy Scale: Fair                              Cognition Arousal/Alertness: Awake/alert Behavior During Therapy: WFL for tasks assessed/performed Overall Cognitive Status: Within Functional Limits for tasks assessed  Exercises Other Exercises Other Exercises: Post hip precaution education and review Other Exercises: 180 deg R turn training to prevent CKC R hip IR    General Comments General comments (skin integrity, edema,  etc.): Pt continues to need cueing for posterior hip precautions      Pertinent Vitals/Pain Pain Assessment Pain Score: 0-No pain (Pt stated R hip was sore but no real pain; pt also mentioned experiencing bad headaches mainly at night) Pain Location: R hip Pain Descriptors / Indicators: Aching, Sore Pain Intervention(s): Monitored during session, Premedicated before session    Home Living                          Prior Function            PT Goals (current goals can now be found in the care plan section) Progress towards PT goals: Progressing toward goals    Frequency    BID      PT Plan Current plan remains appropriate    Co-evaluation              AM-PAC PT "6 Clicks" Mobility   Outcome Measure  Help needed turning from your back to your side while in a flat bed without using bedrails?: A Little Help needed moving from lying on your back to sitting on the side of a flat bed without using bedrails?: A Lot Help needed moving to and from a bed to a chair (including a wheelchair)?: A Lot Help needed standing up from a chair using your arms (e.g., wheelchair or bedside chair)?: A Lot Help needed to walk in hospital room?: A Lot Help needed climbing 3-5 steps with a railing? : Total 6 Click Score: 12    End of Session Equipment Utilized During Treatment: Gait belt Activity Tolerance: Patient tolerated treatment well Patient left: in chair;with chair alarm set;with call bell/phone within reach;with SCD's reapplied Nurse Communication: Mobility status;Weight bearing status;Precautions PT Visit Diagnosis: Muscle weakness (generalized) (M62.81);Other abnormalities of gait and mobility (R26.89);Pain;Difficulty in walking, not elsewhere classified (R26.2);Unsteadiness on feet (R26.81);History of falling (Z91.81) Pain - Right/Left: Right Pain - part of body: Hip     Time: 6045-4098 PT Time Calculation (min) (ACUTE ONLY): 31 min  Charges:                        Marlina Cataldi, SPT 02/16/23, 11:53 AM

## 2023-02-16 NOTE — Progress Notes (Signed)
  Progress Note   Patient: Molly Leonard ZOX:[id number] DOB: 10-09-1945 DOA: 02/09/2023     7 DOS: the patient was seen and examined on 02/16/2023   Brief hospital course: Molly Leonard is a 77 y.o. female with a past medical history of anxiety, depression, asthma/COPD who presents after mechanical fall coming out of the house this morning tripping on her front step landing on her right hip.  Hip plain films positive for moderately displaced proximal right femoral neck fracture. She had Right hip cemented hemiarthroplasty 02/09/23. Awaiting short term rehab placement.  Assessment and Plan: * Hip fracture (HCC) S/p Right hip cemented hemiarthroplasty. Out of bed to chair, WBAT, incentive spirometry. Monitor bowel/ bladder function. TOC working on short term rehab placement. Outpatient orthopedic follow up. Continue Lovenox 14 days post op.  Fall at home, initial encounter Positive mechanical fall with patient tripping coming outside and landing on the right hip. Continue fall precautions PT/ OT advised rehab. Out of bed to chair.   Acute on chronic Anemia Acute blood loss anemia - Hb dropped to 7.7 but improved. Iron, b12, folate ok. Hb today 8.6 today, baseline around 9-10  E.coli UTI Confusion better. Avoid benzos, opiates, sedatives. Rocephin changed to Ceftin, sensitivities reviewed.  Esophagitis, reflux Continue daily PPI  Chronic obstructive pulmonary disease (HCC) No exacerbation.  Continue home inhalers as needed.  Tylenol PRN headaches. Continue constipation regimen.  DVT prophylaxis -Lovenox daily x 14 per ortho team. Encourage out of bed. Nursing supportive care.     Subjective: Patient is seen and examined today morning. She is getting out of bed, working with PT. Had bowel movements. Eating fair.  Physical Exam: Vitals:   02/15/23 2334 02/16/23 0711 02/16/23 0746 02/16/23 1522  BP: 137/65 (!) 140/72 (!) 155/70 (!) 143/58  Pulse: 88 85  91  Resp: 18 18  17    Temp: 98 F (36.7 C) 98.2 F (36.8 C)  98.1 F (36.7 C)  TempSrc:  Oral    SpO2: 100% 100%  98%  Weight:      Height:       General- Elderly Caucasian ill female, in acute distress. Heart- S1, S2 heard, tachycardia noted. Lungs bilateral air entry good,, bibasal rales. Abdomen - distended, nontender, bowel sounds good. Neuro- AAO x3, non focal exam.  Data Reviewed:  CBC hb 8.6, Urine culture sensitivities of E. coli  Family Communication: Patient and husband at bedside agreed with current care plan.   Disposition: Status is: Inpatient Remains inpatient appropriate because: safe discharge plan.  Planned Discharge Destination: short term rehab.     Code Status: Full Code  Time spent: 43 minutes  Author: Marcelino Duster, MD 02/16/2023 5:28 PM  For on call review [email or web address].

## 2023-02-16 NOTE — Plan of Care (Signed)
  Problem: Education: Goal: Knowledge of General Education information will improve Description: Including pain rating scale, medication(s)/side effects and non-pharmacologic comfort measures Outcome: Progressing   Problem: Health Behavior/Discharge Planning: Goal: Ability to manage health-related needs will improve Outcome: Progressing   Problem: Clinical Measurements: Goal: Ability to maintain clinical measurements within normal limits will improve Outcome: Progressing Goal: Will remain free from infection Outcome: Progressing Goal: Diagnostic test results will improve Outcome: Progressing Goal: Respiratory complications will improve Outcome: Progressing Goal: Cardiovascular complication will be avoided Outcome: Progressing   Problem: Activity: Goal: Risk for activity intolerance will decrease Outcome: Progressing   Problem: Nutrition: Goal: Adequate nutrition will be maintained Outcome: Progressing   Problem: Coping: Goal: Level of anxiety will decrease Outcome: Progressing   Problem: Elimination: Goal: Will not experience complications related to bowel motility Outcome: Progressing Goal: Will not experience complications related to urinary retention Outcome: Progressing   Problem: Pain Managment: Goal: General experience of comfort will improve Outcome: Progressing   Problem: Safety: Goal: Ability to remain free from injury will improve Outcome: Progressing   Problem: Skin Integrity: Goal: Risk for impaired skin integrity will decrease Outcome: Progressing   Problem: Education: Goal: Knowledge of the prescribed therapeutic regimen will improve Outcome: Progressing Goal: Understanding of discharge needs will improve Outcome: Progressing Goal: Individualized Educational Video(s) Outcome: Progressing   Problem: Activity: Goal: Ability to avoid complications of mobility impairment will improve Outcome: Progressing Goal: Ability to tolerate increased  activity will improve Outcome: Progressing   Problem: Clinical Measurements: Goal: Postoperative complications will be avoided or minimized Outcome: Progressing   Problem: Pain Management: Goal: Pain level will decrease with appropriate interventions Outcome: Progressing   Problem: Skin Integrity: Goal: Will show signs of wound healing Outcome: Progressing   Problem: Education: Goal: Verbalization of understanding the information provided (i.e., activity precautions, restrictions, etc) will improve Outcome: Progressing Goal: Individualized Educational Video(s) Outcome: Progressing   Problem: Activity: Goal: Ability to ambulate and perform ADLs will improve Outcome: Progressing   Problem: Clinical Measurements: Goal: Postoperative complications will be avoided or minimized Outcome: Progressing   Problem: Self-Concept: Goal: Ability to maintain and perform role responsibilities to the fullest extent possible will improve Outcome: Progressing   Problem: Pain Management: Goal: Pain level will decrease Outcome: Progressing   

## 2023-02-17 DIAGNOSIS — S72001A Fracture of unspecified part of neck of right femur, initial encounter for closed fracture: Secondary | ICD-10-CM | POA: Diagnosis not present

## 2023-02-17 NOTE — TOC Progression Note (Addendum)
Transition of Care [facility]) - Progression Note    Patient Details  Name: Molly Leonard MRN: [id number] Date of Birth: 1945-11-01  [facility] CM/SW Contact  Marlowe Sax, RN Phone Number: 02/17/2023, 5:05 PM  Clinical Narrative:   Met with the patient and her family in the room, I reviewed the 2 bed offers in place, she chose [facility], I notified Gina at [facility], I explained we would need to get Ins approval  Ins approved 6/6-6/10 ref number [id number]    Expected Discharge Plan: Skilled Nursing Facility Barriers to Discharge: SNF Pending bed offer, Insurance Authorization, Continued Medical Work up  Expected Discharge Plan and Services       Living arrangements for the past 2 months: Single Family Home                                       Social Determinants of Health (SDOH) Interventions SDOH Screenings   Food Insecurity: No Food Insecurity (02/09/2023)  Housing: Low Risk  (02/09/2023)  Transportation Needs: No Transportation Needs (02/09/2023)  Utilities: Not At Risk (02/09/2023)  Alcohol Screen: Low Risk  (07/20/2022)  Depression (PHQ2-9): High Risk (07/20/2022)  Financial Resource Strain: Low Risk  (10/28/2021)  Physical Activity: Unknown (10/28/2021)  Social Connections: Moderately Isolated (10/28/2021)  Stress: No Stress Concern Present (10/28/2021)  Tobacco Use: High Risk (02/10/2023)    Readmission Risk Interventions     No data to display

## 2023-02-17 NOTE — Progress Notes (Signed)
  Progress Note   Patient: Molly Leonard UEA:[id number] DOB: September 14, 1946 DOA: 02/09/2023     8 DOS: the patient was seen and examined on 02/17/2023   Brief hospital course:  Molly Leonard is a 77 y.o. female with a past medical history of anxiety, depression, asthma/COPD who presents after mechanical fall coming out of the house this morning tripping on her front step landing on her right hip. Hip plain films positive for moderately displaced proximal right femoral neck fracture. She had Right hip cemented hemiarthroplasty 02/09/23. Awaiting short term rehab placement.     Assessment and Plan:  Hip fracture (HCC) S/p Right hip cemented hemiarthroplasty. Out of bed to chair, WBAT, incentive spirometry. Monitor bowel/ bladder function. TOC working on short term rehab placement. Outpatient orthopedic follow up. Continue Lovenox 14 days post op.   Fall at home, initial encounter Positive mechanical fall with patient tripping coming outside and landing on the right hip. Continue fall precautions PT/ OT advised rehab. Out of bed to chair.    Acute on chronic Anemia Acute blood loss anemia - Hb dropped to 7.7 but improved. Iron, b12, folate ok. Hb today 8.6 today, baseline around 9-10   E.coli UTI Confusion better. Avoid benzos, opiates, sedatives. Rocephin changed to Ceftin, sensitivities reviewed.   Esophagitis, reflux Continue daily PPI   Chronic obstructive pulmonary disease (HCC) No exacerbation.  Continue home inhalers as needed.   Tylenol PRN headaches. Continue constipation regimen.  DVT prophylaxis -Lovenox daily x 14 per ortho team. Encourage out of bed. Nursing supportive care.           Subjective: Patient is seen and examined at the bedside.  Sitting up in chair, eating breakfast  Physical Exam: Vitals:   02/16/23 1522 02/16/23 2205 02/16/23 2345 02/17/23 0755  BP: (!) 143/58 (!) 136/90 136/72 (!) 122/54  Pulse: 91 95 (!) 109 86  Resp: 17 19 20 16   Temp: 98.1  F (36.7 C)  98 F (36.7 C) 98.6 F (37 C)  TempSrc:      SpO2: 98% 99% 93% 94%  Weight:      Height:       General- Elderly Caucasian ill female, in acute distress. Heart- S1, S2 heard,  Lungs bilateral air entry good,, bibasal rales. Abdomen - distended, nontender, bowel sounds good. Neuro- AAO x3, non focal exam.  Data Reviewed: Labs reviewed. There are no new results to review at this time.  Family Communication: None  Disposition: Status is: Inpatient Remains inpatient appropriate because: Awaiting placement  Planned Discharge Destination: Skilled nursing facility    Time spent: 30 minutes  Author: Lucile Shutters, MD 02/17/2023 12:54 PM  For on call review [email or web address].

## 2023-02-17 NOTE — Plan of Care (Signed)
  Problem: Education: Goal: Knowledge of General Education information will improve Description: Including pain rating scale, medication(s)/side effects and non-pharmacologic comfort measures Outcome: Progressing   Problem: Health Behavior/Discharge Planning: Goal: Ability to manage health-related needs will improve Outcome: Progressing   Problem: Clinical Measurements: Goal: Ability to maintain clinical measurements within normal limits will improve Outcome: Progressing Goal: Will remain free from infection Outcome: Progressing Goal: Diagnostic test results will improve Outcome: Progressing Goal: Respiratory complications will improve Outcome: Progressing Goal: Cardiovascular complication will be avoided Outcome: Progressing   Problem: Activity: Goal: Risk for activity intolerance will decrease Outcome: Progressing   Problem: Nutrition: Goal: Adequate nutrition will be maintained Outcome: Progressing   Problem: Coping: Goal: Level of anxiety will decrease Outcome: Progressing   Problem: Elimination: Goal: Will not experience complications related to bowel motility Outcome: Progressing Goal: Will not experience complications related to urinary retention Outcome: Progressing   Problem: Pain Managment: Goal: General experience of comfort will improve Outcome: Progressing   Problem: Safety: Goal: Ability to remain free from injury will improve Outcome: Progressing   Problem: Skin Integrity: Goal: Risk for impaired skin integrity will decrease Outcome: Progressing   Problem: Education: Goal: Knowledge of the prescribed therapeutic regimen will improve Outcome: Progressing Goal: Understanding of discharge needs will improve Outcome: Progressing Goal: Individualized Educational Video(s) Outcome: Progressing   Problem: Activity: Goal: Ability to avoid complications of mobility impairment will improve Outcome: Progressing Goal: Ability to tolerate increased  activity will improve Outcome: Progressing   Problem: Clinical Measurements: Goal: Postoperative complications will be avoided or minimized Outcome: Progressing   Problem: Pain Management: Goal: Pain level will decrease with appropriate interventions Outcome: Progressing   Problem: Skin Integrity: Goal: Will show signs of wound healing Outcome: Progressing   Problem: Education: Goal: Verbalization of understanding the information provided (i.e., activity precautions, restrictions, etc) will improve Outcome: Progressing Goal: Individualized Educational Video(s) Outcome: Progressing   Problem: Activity: Goal: Ability to ambulate and perform ADLs will improve Outcome: Progressing   Problem: Clinical Measurements: Goal: Postoperative complications will be avoided or minimized Outcome: Progressing   Problem: Self-Concept: Goal: Ability to maintain and perform role responsibilities to the fullest extent possible will improve Outcome: Progressing   Problem: Pain Management: Goal: Pain level will decrease Outcome: Progressing   

## 2023-02-17 NOTE — Progress Notes (Signed)
  Subjective: 8 Days Post-Op Procedure(s) (LRB): ARTHROPLASTY BIPOLAR HIP (HEMIARTHROPLASTY) (Right) Patient reports pain as mild.   No complaints Husband at bedside. No complaints. Patient is well, and has had no acute complaints or problems. Denies any CP, SOB, ABD pain. + BM   Objective: Vital signs in last 24 hours: Temp:  [98 F (36.7 C)-98.6 F (37 C)] 98.6 F (37 C) (06/05 0755) Pulse Rate:  [86-109] 86 (06/05 0755) Resp:  [16-20] 16 (06/05 0755) BP: (122-143)/(54-90) 122/54 (06/05 0755) SpO2:  [93 %-99 %] 94 % (06/05 0755)  Intake/Output from previous day: 06/04 0701 - 06/05 0700 In: 0  Out: 2 [Urine:1; Stool:1] Intake/Output this shift: Total I/O In: -  Out: 1 [Urine:1]  Recent Labs    02/14/23 1102 02/15/23 0428 02/16/23 0812  HGB 8.3* 7.7* 8.6*   Recent Labs    02/15/23 0428 02/16/23 0812  WBC 9.2 11.5*  RBC 2.37* 2.66*  HCT 23.5* 26.5*  PLT 337 419*   Recent Labs    02/14/23 1102  NA 136  K 4.7  CL 102  CO2 27  BUN 32*  CREATININE 0.96  GLUCOSE 114*  CALCIUM 8.2*   No results for input(s): "LABPT", "INR" in the last 72 hours.  EXAM General - Patient is Alert, Appropriate, and Oriented Extremity - Neurovascular intact Sensation intact distally Intact pulses distally Dorsiflexion/Plantar flexion intact Dressing - dressing C/D/I and no drainage Motor Function - intact, moving foot and toes well on exam.   Past Medical History:  Diagnosis Date   Anxiety    Depression     Assessment/Plan:   8 Days Post-Op Procedure(s) (LRB): ARTHROPLASTY BIPOLAR HIP (HEMIARTHROPLASTY) (Right) Principal Problem:   Closed right hip fracture, initial encounter [facility]) Active Problems:   Chronic obstructive pulmonary disease (HCC)   Esophagitis, reflux   Depression, recurrent (HCC)   Acute on chronic anemia   Fall at home, initial encounter   Displaced fracture of right femoral neck (HCC)   Chronic idiopathic constipation   Other headache  syndrome   E-coli UTI  Estimated body mass index is 21.93 kg/m as calculated from the following:   Height as of this encounter: 5\' 7"  (1.702 m).   Weight as of this encounter: 63.5 kg. Advance diet Up with therapy Pain well controlled Vital signs stable Acute post op blood loss anemia - Hgb 8.6, stable. Continue with oral Fe. Patient asymptomatic. VSS CM to assist with discharge to SNF  Follow up with [facility] in 2 weeks Lovenox 40 mg SQ daily x 14 days at discharge   DVT Prophylaxis - Lovenox, TED hose, and SCDs Weight-Bearing as tolerated to right leg   T. Cranston Neighbor, PA-C [facility] 02/17/2023, 8:15 AM

## 2023-02-17 NOTE — Progress Notes (Signed)
Physical Therapy Treatment Patient Details Name: Molly Leonard MRN: [id number] DOB: [birth year] Today's Date: 02/17/2023   History of Present Illness Molly Leonard is a 77 y.o. female with a past medical history of anxiety, depression, asthma/COPD who presents after mechanical fall coming out of the house this morning tripping on her front step landing on her right hip. Patient is s/p R hemiarthroplasty, WBAT with posterior hip precautions.    PT Comments    Pt pleasant and motivated to participate in therapy. She reported severe pain (10/10) in her R hip with movement, nursing notified. No major changes in her mobility status since this morning. Able to walk ~100 feet with min assist for sequencing and walker placement. O2 remained mid to high 90's throughout session on room air, HR remained high 80's to low 90's. BP in sitting pre-ambulation was 142/61. She is showing some improvements in being able to remember her hip precautions but still requires constant cueing to ensure compliance throughout ambulation/transfers. Pt will benefit from continued PT services upon discharge to safely address deficits listed in patient problem list for decreased caregiver assistance and eventual return to PLOF.   Recommendations for follow up therapy are one component of a multi-disciplinary discharge planning process, led by the attending physician.  Recommendations may be updated based on patient status, additional functional criteria and insurance authorization.  Follow Up Recommendations  Can patient physically be transported by private vehicle: No    Assistance Recommended at Discharge Frequent or constant Supervision/Assistance  Patient can return home with the following A little help with walking and/or transfers;A lot of help with bathing/dressing/bathroom;Assistance with cooking/housework;Direct supervision/assist for medications management;Direct supervision/assist for financial management;Assist for  transportation;Help with stairs or ramp for entrance   Equipment Recommendations  Other (comment)    Recommendations for Other Services       Precautions / Restrictions Precautions Precautions: Fall;Posterior Hip Restrictions Weight Bearing Restrictions: No RLE Weight Bearing: Weight bearing as tolerated     Mobility  Bed Mobility Overal bed mobility: Needs Assistance Bed Mobility: Sit to Supine       Sit to supine: Mod assist, +2 for physical assistance (Mod assist for LE placement, +2 assist for scooting to [facility])   General bed mobility comments: Increased time and effort, cueing for posterior hip precautions    Transfers Overall transfer level: Needs assistance Equipment used: Rolling walker (2 wheels) Transfers: Sit to/from Stand Sit to Stand: Min assist           General transfer comment: Cueing for foot placement to observe posterior hip precautions    Ambulation/Gait Ambulation/Gait assistance: Min assist (For walker placement and cueing for precautions) Gait Distance (Feet): 100 Feet Assistive device: Rolling walker (2 wheels) Gait Pattern/deviations: Step-to pattern, Antalgic, Decreased stance time - right, Decreased step length - left Gait velocity: decreased     General Gait Details: Mod verbal cues for sequencing when turning R to observe post. hip precautions   Stairs             Wheelchair Mobility    Modified Rankin (Stroke Patients Only)       Balance Overall balance assessment: Needs assistance Sitting-balance support: Feet supported, No upper extremity supported Sitting balance-Leahy Scale: Good     Standing balance support: Bilateral upper extremity supported, During functional activity, Reliant on assistive device for balance Standing balance-Leahy Scale: Fair  Cognition Arousal/Alertness: Awake/alert Behavior During Therapy: WFL for tasks assessed/performed Overall Cognitive  Status: Within Functional Limits for tasks assessed                                          Exercises Total Joint Exercises Long Arc Quad: AROM, Strengthening, Both, 10 reps Other Exercises Other Exercises: Post hip precaution education and review with family present Other Exercises: 180 deg R turn training to prevent CKC R hip IR    General Comments        Pertinent Vitals/Pain Pain Assessment Pain Assessment: 0-10 Pain Score: 10-Worst pain ever (When standing) Pain Location: R hip Pain Descriptors / Indicators: Aching, Sore Pain Intervention(s): Monitored during session, Patient requesting pain meds-RN notified    Home Living                          Prior Function            PT Goals (current goals can now be found in the care plan section) Progress towards PT goals: Progressing toward goals    Frequency    BID      PT Plan Current plan remains appropriate    Co-evaluation              AM-PAC PT "6 Clicks" Mobility   Outcome Measure  Help needed turning from your back to your side while in a flat bed without using bedrails?: A Little Help needed moving from lying on your back to sitting on the side of a flat bed without using bedrails?: A Little Help needed moving to and from a bed to a chair (including a wheelchair)?: A Little Help needed standing up from a chair using your arms (e.g., wheelchair or bedside chair)?: A Little Help needed to walk in hospital room?: A Little Help needed climbing 3-5 steps with a railing? : A Lot 6 Click Score: 17    End of Session Equipment Utilized During Treatment: Gait belt Activity Tolerance: Patient tolerated treatment well Patient left: in bed;with call bell/phone within reach;with SCD's reapplied;with family/visitor present;with nursing/sitter in room;with chair alarm set Nurse Communication: Mobility status;Weight bearing status;Precautions PT Visit Diagnosis: Muscle weakness  (generalized) (M62.81);Other abnormalities of gait and mobility (R26.89);Pain;Difficulty in walking, not elsewhere classified (R26.2);Unsteadiness on feet (R26.81);History of falling (Z91.81) Pain - Right/Left: Right Pain - part of body: Hip     Time: 0454-0981 PT Time Calculation (min) (ACUTE ONLY): 27 min  Charges:                       Aishi Courts, SPT 02/17/23, 5:07 PM

## 2023-02-17 NOTE — Progress Notes (Signed)
Physical Therapy Treatment Patient Details Name: Molly Leonard MRN: [id number] DOB: 1946-03-31 Today's Date: 02/17/2023   History of Present Illness Molly Leonard is a 77 y.o. female with a past medical history of anxiety, depression, asthma/COPD who presents after mechanical fall coming out of the house this morning tripping on her front step landing on her right hip. Patient is s/p R hemiarthroplasty, WBAT with posterior hip precautions.    PT Comments    Pt was in chair upon arrival and motivated to participate in therapy. Pt continues to require frequent cueing during ambulation and transfers to observe posterior hip precautions, however she was able to recall them partially this morning. No major changes in mobility status in terms of transfers or gait. She was able to walk 100 feet continuously today with no rest breaks, still using RW and min A. O2 remained in low to mid 90's on room air, HR remained in 100-106 range throughout entirety of session. No adverse responses once back in chair. Pt will benefit from continued PT services upon discharge to safely address deficits listed in patient problem list for decreased caregiver assistance and eventual return to PLOF.   Recommendations for follow up therapy are one component of a multi-disciplinary discharge planning process, led by the attending physician.  Recommendations may be updated based on patient status, additional functional criteria and insurance authorization.  Follow Up Recommendations  Can patient physically be transported by private vehicle: No    Assistance Recommended at Discharge Frequent or constant Supervision/Assistance  Patient can return home with the following A little help with walking and/or transfers;A lot of help with bathing/dressing/bathroom;Assistance with cooking/housework;Direct supervision/assist for medications management;Direct supervision/assist for financial management;Assist for transportation;Help with stairs  or ramp for entrance   Equipment Recommendations  Other (comment)    Recommendations for Other Services       Precautions / Restrictions Precautions Precautions: Fall;Posterior Hip Precaution Comments Restrictions Weight Bearing Restrictions: No RLE Weight Bearing: Weight bearing as tolerated     Mobility  Bed Mobility    NT, pt in recliner upon arrival                Transfers Overall transfer level: Needs assistance Equipment used: Rolling walker (2 wheels) Transfers: Sit to/from Stand Sit to Stand: Min assist           General transfer comment: Cueing for foot placement to observe posterior hip precautions    Ambulation/Gait Ambulation/Gait assistance: Min assist for prevention of buckling Gait Distance (Feet): 100 Feet Assistive device: Rolling walker (2 wheels) Gait Pattern/deviations: Step-to pattern, Antalgic, Decreased stance time - right, Decreased step length - left Gait velocity: decreased     General Gait Details: Mod verbal cues for sequencing when turning R to observe post. hip precautions   Stairs             Wheelchair Mobility    Modified Rankin (Stroke Patients Only)       Balance Overall balance assessment: Needs assistance Sitting-balance support: Feet supported, No upper extremity supported Sitting balance-Leahy Scale: Good     Standing balance support: Bilateral upper extremity supported, During functional activity, Reliant on assistive device for balance Standing balance-Leahy Scale: Fair                              Cognition Arousal/Alertness: Awake/alert Behavior During Therapy: WFL for tasks assessed/performed Overall Cognitive Status: Within Functional Limits for tasks assessed  Exercises Total Joint Exercises Long Arc Quad: AROM, Strengthening, Both, 10 reps Other Exercises Other Exercises: Post hip precaution education and  review Other Exercises: 180 deg R turn training to prevent CKC R hip IR    General Comments        Pertinent Vitals/Pain Pain Assessment Pain Assessment: No/denies pain    Home Living                          Prior Function            PT Goals (current goals can now be found in the care plan section) Progress towards PT goals: Progressing toward goals    Frequency    BID      PT Plan Current plan remains appropriate    Co-evaluation              AM-PAC PT "6 Clicks" Mobility   Outcome Measure  Help needed turning from your back to your side while in a flat bed without using bedrails?: A Little Help needed moving from lying on your back to sitting on the side of a flat bed without using bedrails?: A Lot Help needed moving to and from a bed to a chair (including a wheelchair)?: A Lot Help needed standing up from a chair using your arms (e.g., wheelchair or bedside chair)?: A Lot Help needed to walk in hospital room?: A Lot Help needed climbing 3-5 steps with a railing? : Total 6 Click Score: 12    End of Session Equipment Utilized During Treatment: Gait belt Activity Tolerance: Patient limited by pain;Patient tolerated treatment well Patient left: in chair;with chair alarm set;with call bell/phone within reach;with SCD's reapplied Nurse Communication: Mobility status;Weight bearing status;Precautions PT Visit Diagnosis: Muscle weakness (generalized) (M62.81);Other abnormalities of gait and mobility (R26.89);Pain;Difficulty in walking, not elsewhere classified (R26.2);Unsteadiness on feet (R26.81);History of falling (Z91.81) Pain - Right/Left: Right Pain - part of body: Hip     Time: 1610-9604 PT Time Calculation (min) (ACUTE ONLY): 28 min  Charges:                       Angele Wiemann, SPT 02/17/23, 11:54 AM

## 2023-02-18 DIAGNOSIS — Z743 Need for continuous supervision: Secondary | ICD-10-CM | POA: Diagnosis not present

## 2023-02-18 DIAGNOSIS — D72829 Elevated white blood cell count, unspecified: Secondary | ICD-10-CM | POA: Diagnosis not present

## 2023-02-18 DIAGNOSIS — J449 Chronic obstructive pulmonary disease, unspecified: Secondary | ICD-10-CM | POA: Diagnosis not present

## 2023-02-18 DIAGNOSIS — R279 Unspecified lack of coordination: Secondary | ICD-10-CM | POA: Diagnosis not present

## 2023-02-18 DIAGNOSIS — G894 Chronic pain syndrome: Secondary | ICD-10-CM | POA: Diagnosis not present

## 2023-02-18 DIAGNOSIS — M6259 Muscle wasting and atrophy, not elsewhere classified, multiple sites: Secondary | ICD-10-CM | POA: Diagnosis not present

## 2023-02-18 DIAGNOSIS — N1831 Chronic kidney disease, stage 3a: Secondary | ICD-10-CM | POA: Diagnosis not present

## 2023-02-18 DIAGNOSIS — D649 Anemia, unspecified: Secondary | ICD-10-CM | POA: Diagnosis not present

## 2023-02-18 DIAGNOSIS — S72001D Fracture of unspecified part of neck of right femur, subsequent encounter for closed fracture with routine healing: Secondary | ICD-10-CM | POA: Diagnosis not present

## 2023-02-18 DIAGNOSIS — R262 Difficulty in walking, not elsewhere classified: Secondary | ICD-10-CM | POA: Diagnosis not present

## 2023-02-18 DIAGNOSIS — R6889 Other general symptoms and signs: Secondary | ICD-10-CM | POA: Diagnosis not present

## 2023-02-18 DIAGNOSIS — Z741 Need for assistance with personal care: Secondary | ICD-10-CM | POA: Diagnosis not present

## 2023-02-18 DIAGNOSIS — Z7401 Bed confinement status: Secondary | ICD-10-CM | POA: Diagnosis not present

## 2023-02-18 DIAGNOSIS — N390 Urinary tract infection, site not specified: Secondary | ICD-10-CM | POA: Diagnosis not present

## 2023-02-18 DIAGNOSIS — R945 Abnormal results of liver function studies: Secondary | ICD-10-CM | POA: Diagnosis not present

## 2023-02-18 DIAGNOSIS — I10 Essential (primary) hypertension: Secondary | ICD-10-CM | POA: Diagnosis not present

## 2023-02-18 DIAGNOSIS — W19XXXA Unspecified fall, initial encounter: Secondary | ICD-10-CM | POA: Diagnosis not present

## 2023-02-18 DIAGNOSIS — S72001A Fracture of unspecified part of neck of right femur, initial encounter for closed fracture: Secondary | ICD-10-CM | POA: Diagnosis not present

## 2023-02-18 DIAGNOSIS — R2681 Unsteadiness on feet: Secondary | ICD-10-CM | POA: Diagnosis not present

## 2023-02-18 DIAGNOSIS — N3000 Acute cystitis without hematuria: Secondary | ICD-10-CM | POA: Diagnosis not present

## 2023-02-18 DIAGNOSIS — D62 Acute posthemorrhagic anemia: Secondary | ICD-10-CM | POA: Diagnosis not present

## 2023-02-18 MED ORDER — CEFUROXIME AXETIL 250 MG PO TABS: 250.0000 mg | ORAL_TABLET | Freq: Two times a day (BID) | ORAL | 0 refills | Status: AC

## 2023-02-18 MED ORDER — POLYETHYLENE GLYCOL 3350 17 G PO PACK: 17.0000 g | PACK | Freq: Every day | ORAL | 0 refills | Status: DC

## 2023-02-18 NOTE — Discharge Summary (Signed)
Physician Discharge Summary   Patient: Molly Leonard MRN: [id number] DOB: 07/28/1946  Admit date:     02/09/2023  Discharge date: 02/18/23  Discharge Physician: Verlyn Lambert   PCP: Alfredia Ferguson, PA-C   Recommendations at discharge:   Keep scheduled follow-up appointment with orthopedic surgery  Discharge Diagnoses: Principal Problem:   Closed right hip fracture, initial encounter [facility]) Active Problems:   Chronic obstructive pulmonary disease (HCC)   Esophagitis, reflux   Depression, recurrent (HCC)   Acute on chronic anemia   Fall at home, initial encounter   Displaced fracture of right femoral neck (HCC)   Chronic idiopathic constipation   Other headache syndrome   E-coli UTI  Resolved Problems:   * No resolved hospital problems. *  Hospital Course: Molly Leonard is a 77 y.o. female with medical history significant of anxiety, depression, asthma/COPD presenting with fall and hip fracture.  Patient reports mechanical fall when patient tripped coming out of the house today.  Patient subsequently landed on her right hip.  No reported head trauma loss conscious.  Patient denies any weakness or dizziness prior to fall.  Has had significant right hip pain after the fall with difficulty with ambulation.  No chest pain or shortness of breath.  No nausea or vomiting.  No abdominal pain or diarrhea. Presented to the ER afebrile, hemodynamically stable.  CBC and CMP are pending.  CT head stable.  Hip plain films positive for moderately displaced proximal right femoral neck fracture.  Dr. Audelia Acton w/ orthopedic surgery aware of case and is tentatively planning operative repair.     Assessment and Plan: Hip fracture (HCC) S/p Right hip cemented hemiarthroplasty. Out of bed to chair, WBAT, incentive spirometry. For discharge at [facility] today. Outpatient orthopedic follow up. Continue Lovenox 14 days post op.   Fall at home, initial encounter Positive mechanical fall with patient  tripping coming outside and landing on the right hip. Continue fall precautions PT/ OT advised rehab.    Acute on chronic Anemia Acute blood loss anemia - Hb dropped to 7.7 but improved. Iron, b12, folate ok. Hb today 8.6 today, baseline around 9-10   E.coli UTI Confusion better. Avoid benzos, opiates, sedatives. Rocephin changed to Ceftin to complete a 5 day course of therapy   Esophagitis, reflux Continue daily PPI   Chronic obstructive pulmonary disease (HCC) No exacerbation.  Continue home inhalers as needed.          Consultants: Orthopedic surgery Procedures performed: Bipolar hip hemiarthroplasty (right) Disposition: Skilled nursing facility Diet recommendation:  Discharge Diet Orders (From admission, onward)     Start     Ordered   02/18/23 0000  Diet - low sodium heart healthy        02/18/23 1056           Cardiac diet DISCHARGE MEDICATION: Allergies as of 02/18/2023   No Known Allergies      Medication List     TAKE these medications    acetaminophen 325 MG tablet Commonly known as: TYLENOL Take 1-2 tablets (325-650 mg total) by mouth every 6 (six) hours as needed for mild pain (pain score 1-3 or temp > 100.5).   albuterol 108 (90 Base) MCG/ACT inhaler Commonly known as: VENTOLIN HFA Inhale 2 puffs into the lungs every 6 (six) hours as needed for wheezing or shortness of breath.   ALPRAZolam 1 MG tablet Commonly known as: XANAX TAKE ONE TABLET THREE TIMES A DAY AS NEEDED FOR ANXIETY What changed: See  the new instructions.   cefUROXime 250 MG tablet Commonly known as: CEFTIN Take 1 tablet (250 mg total) by mouth 2 (two) times daily with a meal for 3 days.   docusate sodium 100 MG capsule Commonly known as: COLACE Take 2 capsules (200 mg total) by mouth 2 (two) times daily.   enoxaparin 40 MG/0.4ML injection Commonly known as: LOVENOX Inject 0.4 mLs (40 mg total) into the skin daily for 14 days.   FLUoxetine 20 MG capsule Commonly  known as: PROZAC TAKE 2 CAPSULES BY MOUTH DAILY   fluticasone 50 MCG/ACT nasal spray Commonly known as: FLONASE Place 2 sprays into both nostrils daily.   folic acid 1 MG tablet Commonly known as: FOLVITE TAKE ONE TABLET BY MOUTH DAILY.   hydrochlorothiazide 12.5 MG tablet Commonly known as: HYDRODIURIL TAKE 1 TABLET BY MOUTH DAILY   HYDROcodone-acetaminophen 5-325 MG tablet Commonly known as: NORCO/VICODIN Take 1-2 tablets by mouth every 4 (four) hours as needed for moderate pain (pain score 4-6).   polyethylene glycol 17 g packet Commonly known as: MIRALAX / GLYCOLAX Take 17 g by mouth daily.        Follow-up Information     Evon Slack, PA-C Follow up in 2 week(s).   Specialties: Orthopedic Surgery, Emergency Medicine Contact information: [address] [place] [address] [phone]         Alfredia Ferguson, PA-C Follow up in 1 week(s).   Specialty: Physician Assistant Contact information: [address] [place] [address] [phone]                Discharge Exam: Ceasar Mons Weights   02/09/23 0855  Weight: 63.5 kg   General- Elderly Caucasian  female, in no acute distress. Heart- S1, S2 heard,  Lungs bilateral air entry good, Abdomen - distended, nontender, bowel sounds good. Neuro- AAO x3, non focal exam. Extremities -  decreased range of motion right hip    Condition at discharge: stable  The results of significant diagnostics from this hospitalization (including imaging, microbiology, ancillary and laboratory) are listed below for reference.   Imaging Studies: DG HIP PORT UNILAT WITH PELVIS 1V RIGHT  Result Date: 02/09/2023 CLINICAL DATA:  Elective surgery. EXAM: DG HIP (WITH OR WITHOUT PELVIS) 1V PORT RIGHT COMPARISON:  Preoperative radiograph FINDINGS: Right hip arthroplasty in expected alignment. No periprosthetic lucency or fracture. Recent postsurgical change includes air and edema in the soft tissues.  IMPRESSION: Right hip arthroplasty without immediate postoperative complication. Electronically Signed   By: Narda Rutherford M.D.   On: 02/09/2023 18:29   DG Femur Min 2 Views Right  Result Date: 02/09/2023 CLINICAL DATA:  Fall, right hip fracture. EXAM: RIGHT FEMUR 2 VIEWS COMPARISON:  Hip radiograph performed earlier on the same date. FINDINGS: Displaced subcapital right femoral neck fracture. Distal femur is intact. Tricompartmental knee joint space narrowing. No appreciable knee joint effusion. IMPRESSION: Displaced subcapital right femoral neck fracture. Distal femur is intact. Electronically Signed   By: Larose Hires D.O.   On: 02/09/2023 12:16   CT Head Wo Contrast  Result Date: 02/09/2023 CLINICAL DATA:  Fall, trauma, possible head injury EXAM: CT HEAD WITHOUT CONTRAST TECHNIQUE: Contiguous axial images were obtained from the base of the skull through the vertex without intravenous contrast. RADIATION DOSE REDUCTION: This exam was performed according to the departmental dose-optimization program which includes automated exposure control, adjustment of the mA and/or kV according to patient size and/or use of iterative reconstruction technique. COMPARISON:  None Available. FINDINGS: Brain:  Age related atrophy pattern and scattered chronic white matter microvascular ischemic changes throughout both cerebral hemispheres. No acute intracranial hemorrhage, new mass lesion, new infarction, midline shift, herniation, hydrocephalus, or extra-axial fluid collection. No focal mass effect or edema. Cisterns are patent. No cerebellar abnormality. Vascular: No hyperdense vessel or unexpected calcification. Skull: Normal. Negative for fracture or focal lesion. Sinuses/Orbits: No acute finding. Other: None. IMPRESSION: 1. Atrophy and chronic white matter microvascular ischemic changes. 2. No acute intracranial abnormality by noncontrast CT. Electronically Signed   By: Judie Petit.  Shick M.D.   On: 02/09/2023 09:57   DG  Chest 1 View  Result Date: 02/09/2023 CLINICAL DATA:  Fall today. EXAM: CHEST  1 VIEW COMPARISON:  November 08, 2016. FINDINGS: Mild cardiomegaly. No acute pulmonary disease. Bony thorax is unremarkable. IMPRESSION: No active disease. Electronically Signed   By: Lupita Raider M.D.   On: 02/09/2023 09:57   DG Hip Unilat W or Wo Pelvis 2-3 Views Right  Result Date: 02/09/2023 CLINICAL DATA:  Fall today. EXAM: DG HIP (WITH OR WITHOUT PELVIS) 2-3V RIGHT COMPARISON:  None Available. FINDINGS: Moderately displaced proximal right femoral neck fracture is noted. Left hip is unremarkable. IMPRESSION: Moderately displaced proximal right femoral neck fracture. Electronically Signed   By: Lupita Raider M.D.   On: 02/09/2023 09:56    Microbiology: Results for orders placed or performed during the hospital encounter of 02/09/23  Urine Culture (for pregnant, neutropenic or urologic patients or patients with an indwelling urinary catheter)     Status: Abnormal   Collection Time: 02/13/23  9:22 PM   Specimen: Urine, Clean Catch  Result Value Ref Range Status   Specimen Description   Final    URINE, CLEAN CATCH Performed at [facility], [address] [place], [place] [address]    Special Requests   Final    NONE Performed at [facility], [address] [place], [place] [address]    Culture >=100,000 COLONIES/mL ESCHERICHIA COLI (A)  Final   Report Status 02/16/2023 FINAL  Final   Organism ID, Bacteria ESCHERICHIA COLI (A)  Final      Susceptibility   Escherichia coli - MIC*    AMPICILLIN >=32 RESISTANT Resistant     CEFAZOLIN <=4 SENSITIVE Sensitive     CEFEPIME <=0.12 SENSITIVE Sensitive     CEFTRIAXONE <=0.25 SENSITIVE Sensitive     CIPROFLOXACIN <=0.25 SENSITIVE Sensitive     GENTAMICIN <=1 SENSITIVE Sensitive     IMIPENEM <=0.25 SENSITIVE Sensitive     NITROFURANTOIN <=16 SENSITIVE Sensitive     TRIMETH/SULFA <=20 SENSITIVE Sensitive     AMPICILLIN/SULBACTAM 16  INTERMEDIATE Intermediate     PIP/TAZO <=4 SENSITIVE Sensitive     * >=100,000 COLONIES/mL ESCHERICHIA COLI    Labs: CBC: Recent Labs  Lab 02/12/23 0408 02/14/23 1102 02/15/23 0428 02/16/23 0812  WBC 11.4* 9.7 9.2 11.5*  HGB 8.8* 8.3* 7.7* 8.6*  HCT 27.2* 25.5* 23.5* 26.5*  MCV 99.3 100.0 99.2 99.6  PLT 294 373 337 419*   Basic Metabolic Panel: Recent Labs  Lab 02/12/23 0408 02/14/23 1102  NA 136 136  K 3.3* 4.7  CL 105 102  CO2 20* 27  GLUCOSE 123* 114*  BUN 35* 32*  CREATININE 1.06* 0.96  CALCIUM 8.4* 8.2*   Liver Function Tests: No results for input(s): "AST", "ALT", "ALKPHOS", "BILITOT", "PROT", "ALBUMIN" in the last 168 hours. CBG: No results for input(s): "GLUCAP" in the last 168 hours.  Discharge time spent: greater than 30  minutes.  Signed: Lucile Shutters, MD [facility] 02/18/2023

## 2023-02-18 NOTE — TOC Transition Note (Signed)
Transition of Care [facility]) - CM/SW Discharge Note   Patient Details  Name: Molly Leonard MRN: [id number] Date of Birth: June 01, 1946  Transition of Care [facility]) CM/SW Contact:  Garret Reddish, RN Phone Number: 02/18/2023, 2:34 PM   Clinical Narrative:  Chart reviewed.  Noted that patient has orders for discharge today.  Patient has elected to go to [facility].    I have spoken with Almira Coaster with [facility].  She informs me that she will have a bed for the patient today.  Almira Coaster reports that patient will go to room 713 and the number to call report is [phone].    I have informed [name] that [facility] will have a bed for her today.  I have also informed [name] that [facility] will transport her to the facility today.  I arranged transport  to [facility] with [facility].  I have informed staff nurse of the above information.         Final next level of care: Skilled Nursing Facility Barriers to Discharge: No Barriers Identified   Patient Goals and CMS Choice CMS Medicare.gov Compare Post Acute Care list provided to:: Patient Choice offered to / list presented to : Patient  Discharge Placement                Patient chooses bed at: [facility] [place] Patient to be transferred to facility by: [facility]   Patient and family notified of of transfer: 02/18/23  Discharge Plan and Services Additional resources added to the After Visit Summary for                                       Social Determinants of Health (SDOH) Interventions SDOH Screenings   Food Insecurity: No Food Insecurity (02/09/2023)  Housing: Low Risk  (02/09/2023)  Transportation Needs: No Transportation Needs (02/09/2023)  Utilities: Not At Risk (02/09/2023)  Alcohol Screen: Low Risk  (07/20/2022)  Depression (PHQ2-9): High Risk (07/20/2022)  Financial Resource Strain: Low Risk  (10/28/2021)  Physical Activity: Unknown (10/28/2021)  Social  Connections: Moderately Isolated (10/28/2021)  Stress: No Stress Concern Present (10/28/2021)  Tobacco Use: High Risk (02/10/2023)     Readmission Risk Interventions     No data to display

## 2023-02-18 NOTE — Progress Notes (Addendum)
Physical Therapy Treatment Patient Details Name: Molly Leonard MRN: [id number] DOB: 1945-12-04 Today's Date: 02/18/2023   History of Present Illness Molly Leonard is a 77 y.o. female with a past medical history of anxiety, depression, asthma/COPD who presents after mechanical fall coming out of the house this morning tripping on her front step landing on her right hip. Patient is s/p R hemiarthroplasty, WBAT with posterior hip precautions.    PT Comments    Pt was pleasant and motivated for therapy this morning. No major changes in her mobility status since yesterday. She was able to get up and use the bathroom with a raised seat with min assist for transfers and ambulation. Pt was able to ambulate ~200 feet today, she still demonstrates difficulty and hesitancy with WB on the R leg with reports of soreness in her R hip. She still requires mod cueing for observing posterior hip precautions when performing STS or ambulation. O2 remained in the 90's throughout the entirety of the session on room air. HR ranged from high 80's to 106 bpm. Pt will benefit from continued PT services upon discharge to safely address deficits listed in patient problem list for decreased caregiver assistance and eventual return to PLOF.   Recommendations for follow up therapy are one component of a multi-disciplinary discharge planning process, led by the attending physician.  Recommendations may be updated based on patient status, additional functional criteria and insurance authorization.  Follow Up Recommendations  Can patient physically be transported by private vehicle: No    Assistance Recommended at Discharge Frequent or constant Supervision/Assistance  Patient can return home with the following A little help with walking and/or transfers;A lot of help with bathing/dressing/bathroom;Assistance with cooking/housework;Direct supervision/assist for medications management;Direct supervision/assist for financial  management;Assist for transportation;Help with stairs or ramp for entrance   Equipment Recommendations  Other (comment) (TBD at next venue of care)    Recommendations for Other Services       Precautions / Restrictions Precautions Precautions: Fall;Posterior Hip Restrictions Weight Bearing Restrictions: No RLE Weight Bearing: Weight bearing as tolerated     Mobility  Bed Mobility                    Transfers Overall transfer level: Needs assistance Equipment used: Rolling walker (2 wheels) Transfers: Sit to/from Stand Sit to Stand: Min assist           General transfer comment: Cueing for foot placement to observe posterior hip precautions    Ambulation/Gait Ambulation/Gait assistance: Min assist (For walker placement and cueing for precautions) Gait Distance (Feet): 200 Feet Assistive device: Rolling walker (2 wheels) Gait Pattern/deviations: Step-to pattern, Antalgic, Decreased stance time - right, Decreased step length - left Gait velocity: decreased     General Gait Details: verbal cues for sequencing when turning R to observe post. hip precautions. Step-to pattern with hesitancy to weight-bear through R side. Slow but consistent gait speed.   Stairs             Wheelchair Mobility    Modified Rankin (Stroke Patients Only)       Balance Overall balance assessment: Needs assistance Sitting-balance support: Feet supported, No upper extremity supported Sitting balance-Leahy Scale: Good     Standing balance support: No upper extremity supported Standing balance-Leahy Scale: Fair Standing balance comment: Pt able to stand x 10 seconds with no UE support while taking pain meds with no LOB  Cognition Arousal/Alertness: Awake/alert Behavior During Therapy: WFL for tasks assessed/performed Overall Cognitive Status: Within Functional Limits for tasks assessed                                           Exercises Total Joint Exercises Long Arc Quad: AROM, Strengthening, Both, 10 reps (With manual resistance) Other Exercises Other Exercises: Post hip precaution education and review Other Exercises: 180 deg R turn training to prevent CKC R hip IR    General Comments        Pertinent Vitals/Pain Pain Assessment Pain Assessment: 0-10 Pain Score: 8  Pain Location: R hip Pain Descriptors / Indicators: Aching, Sore Pain Intervention(s): Monitored during session, RN gave pain meds during session    Home Living                          Prior Function            PT Goals (current goals can now be found in the care plan section) Progress towards PT goals: Progressing toward goals    Frequency    BID      PT Plan Current plan remains appropriate    Co-evaluation              AM-PAC PT "6 Clicks" Mobility   Outcome Measure  Help needed turning from your back to your side while in a flat bed without using bedrails?: A Little Help needed moving from lying on your back to sitting on the side of a flat bed without using bedrails?: A Little Help needed moving to and from a bed to a chair (including a wheelchair)?: A Little Help needed standing up from a chair using your arms (e.g., wheelchair or bedside chair)?: A Little Help needed to walk in hospital room?: A Little Help needed climbing 3-5 steps with a railing? : A Lot 6 Click Score: 17    End of Session Equipment Utilized During Treatment: Gait belt Activity Tolerance: Patient tolerated treatment well Patient left: in chair;with chair alarm set;with call bell/phone within reach   PT Visit Diagnosis: Muscle weakness (generalized) (M62.81);Other abnormalities of gait and mobility (R26.89);Pain;Difficulty in walking, not elsewhere classified (R26.2);Unsteadiness on feet (R26.81);History of falling (Z91.81) Pain - Right/Left: Right Pain - part of body: Hip     Time: 0852-0926 PT Time  Calculation (min) (ACUTE ONLY): 34 min  Charges:                        Timothy Crutchfield, SPT 02/18/23, 10:56 AM   This entire session was performed under direct supervision and direction of a licensed therapist/therapist assistant. I have personally read, edited and approve of the note as written.   Loran Senters, DPT

## 2023-02-18 NOTE — Care Management Important Message (Signed)
Important Message  Patient Details  Name: Molly Leonard MRN: [id number] Date of Birth: 07-22-1946   Medicare Important Message Given:  Yes     Olegario Messier A Sherese Heyward 02/18/2023, 11:01 AM

## 2023-02-18 NOTE — Progress Notes (Signed)
[facility] was called and report was given to nurse Crystal, patient has left by EMS transport.

## 2023-02-18 NOTE — Plan of Care (Signed)
Problem: Education: Goal: Knowledge of General Education information will improve Description: Including pain rating scale, medication(s)/side effects and non-pharmacologic comfort measures Outcome: Progressing   Problem: Health Behavior/Discharge Planning: Goal: Ability to manage health-related needs will improve Outcome: Progressing   Problem: Clinical Measurements: Goal: Ability to maintain clinical measurements within normal limits will improve Outcome: Progressing Goal: Will remain free from infection Outcome: Progressing Goal: Diagnostic test results will improve Outcome: Progressing Goal: Respiratory complications will improve Outcome: Progressing Goal: Cardiovascular complication will be avoided Outcome: Progressing   Problem: Activity: Goal: Risk for activity intolerance will decrease Outcome: Progressing   Problem: Nutrition: Goal: Adequate nutrition will be maintained Outcome: Progressing   Problem: Coping: Goal: Level of anxiety will decrease Outcome: Progressing   Problem: Elimination: Goal: Will not experience complications related to bowel motility Outcome: Progressing Goal: Will not experience complications related to urinary retention Outcome: Progressing   Problem: Pain Managment: Goal: General experience of comfort will improve Outcome: Progressing   Problem: Safety: Goal: Ability to remain free from injury will improve Outcome: Progressing   Problem: Skin Integrity: Goal: Risk for impaired skin integrity will decrease Outcome: Progressing   Problem: Education: Goal: Knowledge of the prescribed therapeutic regimen will improve Outcome: Progressing Goal: Understanding of discharge needs will improve Outcome: Progressing Goal: Individualized Educational Video(s) Outcome: Progressing   Problem: Activity: Goal: Ability to avoid complications of mobility impairment will improve Outcome: Progressing Goal: Ability to tolerate increased  activity will improve Outcome: Progressing   Problem: Clinical Measurements: Goal: Postoperative complications will be avoided or minimized Outcome: Progressing   Problem: Pain Management: Goal: Pain level will decrease with appropriate interventions Outcome: Progressing   Problem: Skin Integrity: Goal: Will show signs of wound healing Outcome: Progressing   Problem: Education: Goal: Verbalization of understanding the information provided (i.e., activity precautions, restrictions, etc) will improve Outcome: Progressing Goal: Individualized Educational Video(s) Outcome: Progressing   Problem: Activity: Goal: Ability to ambulate and perform ADLs will improve Outcome: Progressing   Problem: Clinical Measurements: Goal: Postoperative complications will be avoided or minimized Outcome: Progressing   Problem: Self-Concept: Goal: Ability to maintain and perform role responsibilities to the fullest extent possible will improve Outcome: Progressing   Problem: Pain Management: Goal: Pain level will decrease Outcome: Progressing   Problem: Education: Goal: Knowledge of General Education information will improve Description: Including pain rating scale, medication(s)/side effects and non-pharmacologic comfort measures 02/18/2023 1635 by Emilio Aspen, RN Outcome: Adequate for Discharge 02/18/2023 1635 by Emilio Aspen, RN Outcome: Progressing   Problem: Health Behavior/Discharge Planning: Goal: Ability to manage health-related needs will improve 02/18/2023 1635 by Emilio Aspen, RN Outcome: Adequate for Discharge 02/18/2023 1635 by Emilio Aspen, RN Outcome: Progressing   Problem: Clinical Measurements: Goal: Ability to maintain clinical measurements within normal limits will improve 02/18/2023 1635 by Emilio Aspen, RN Outcome: Adequate for Discharge 02/18/2023 1635 by Emilio Aspen, RN Outcome: Progressing Goal: Will remain free from infection 02/18/2023 1635 by  Emilio Aspen, RN Outcome: Adequate for Discharge 02/18/2023 1635 by Emilio Aspen, RN Outcome: Progressing Goal: Diagnostic test results will improve 02/18/2023 1635 by Emilio Aspen, RN Outcome: Adequate for Discharge 02/18/2023 1635 by Emilio Aspen, RN Outcome: Progressing Goal: Respiratory complications will improve 02/18/2023 1635 by Emilio Aspen, RN Outcome: Adequate for Discharge 02/18/2023 1635 by Emilio Aspen, RN Outcome: Progressing Goal: Cardiovascular complication will be avoided 02/18/2023 1635 by Emilio Aspen, RN Outcome: Adequate for Discharge 02/18/2023 1635 by Emilio Aspen, RN  Outcome: Progressing   Problem: Activity: Goal: Risk for activity intolerance will decrease 02/18/2023 1635 by Emilio Aspen, RN Outcome: Adequate for Discharge 02/18/2023 1635 by Emilio Aspen, RN Outcome: Progressing   Problem: Nutrition: Goal: Adequate nutrition will be maintained 02/18/2023 1635 by Emilio Aspen, RN Outcome: Adequate for Discharge 02/18/2023 1635 by Emilio Aspen, RN Outcome: Progressing   Problem: Coping: Goal: Level of anxiety will decrease 02/18/2023 1635 by Emilio Aspen, RN Outcome: Adequate for Discharge 02/18/2023 1635 by Emilio Aspen, RN Outcome: Progressing   Problem: Elimination: Goal: Will not experience complications related to bowel motility 02/18/2023 1635 by Emilio Aspen, RN Outcome: Adequate for Discharge 02/18/2023 1635 by Emilio Aspen, RN Outcome: Progressing Goal: Will not experience complications related to urinary retention 02/18/2023 1635 by Emilio Aspen, RN Outcome: Adequate for Discharge 02/18/2023 1635 by Emilio Aspen, RN Outcome: Progressing   Problem: Pain Managment: Goal: General experience of comfort will improve 02/18/2023 1635 by Emilio Aspen, RN Outcome: Adequate for Discharge 02/18/2023 1635 by Emilio Aspen, RN Outcome: Progressing   Problem: Safety: Goal: Ability to remain free  from injury will improve 02/18/2023 1635 by Emilio Aspen, RN Outcome: Adequate for Discharge 02/18/2023 1635 by Emilio Aspen, RN Outcome: Progressing   Problem: Skin Integrity: Goal: Risk for impaired skin integrity will decrease 02/18/2023 1635 by Emilio Aspen, RN Outcome: Adequate for Discharge 02/18/2023 1635 by Emilio Aspen, RN Outcome: Progressing   Problem: Education: Goal: Knowledge of the prescribed therapeutic regimen will improve 02/18/2023 1635 by Emilio Aspen, RN Outcome: Adequate for Discharge 02/18/2023 1635 by Emilio Aspen, RN Outcome: Progressing Goal: Understanding of discharge needs will improve 02/18/2023 1635 by Emilio Aspen, RN Outcome: Adequate for Discharge 02/18/2023 1635 by Emilio Aspen, RN Outcome: Progressing Goal: Individualized Educational Video(s) 02/18/2023 1635 by Emilio Aspen, RN Outcome: Adequate for Discharge 02/18/2023 1635 by Emilio Aspen, RN Outcome: Progressing   Problem: Activity: Goal: Ability to avoid complications of mobility impairment will improve 02/18/2023 1635 by Emilio Aspen, RN Outcome: Adequate for Discharge 02/18/2023 1635 by Emilio Aspen, RN Outcome: Progressing Goal: Ability to tolerate increased activity will improve 02/18/2023 1635 by Emilio Aspen, RN Outcome: Adequate for Discharge 02/18/2023 1635 by Emilio Aspen, RN Outcome: Progressing   Problem: Clinical Measurements: Goal: Postoperative complications will be avoided or minimized 02/18/2023 1635 by Emilio Aspen, RN Outcome: Adequate for Discharge 02/18/2023 1635 by Emilio Aspen, RN Outcome: Progressing   Problem: Pain Management: Goal: Pain level will decrease with appropriate interventions 02/18/2023 1635 by Emilio Aspen, RN Outcome: Adequate for Discharge 02/18/2023 1635 by Emilio Aspen, RN Outcome: Progressing   Problem: Skin Integrity: Goal: Will show signs of wound healing 02/18/2023 1635 by Emilio Aspen,  RN Outcome: Adequate for Discharge 02/18/2023 1635 by Emilio Aspen, RN Outcome: Progressing   Problem: Education: Goal: Verbalization of understanding the information provided (i.e., activity precautions, restrictions, etc) will improve 02/18/2023 1635 by Emilio Aspen, RN Outcome: Adequate for Discharge 02/18/2023 1635 by Emilio Aspen, RN Outcome: Progressing Goal: Individualized Educational Video(s) 02/18/2023 1635 by Emilio Aspen, RN Outcome: Adequate for Discharge 02/18/2023 1635 by Emilio Aspen, RN Outcome: Progressing   Problem: Activity: Goal: Ability to ambulate and perform ADLs will improve 02/18/2023 1635 by Emilio Aspen, RN Outcome: Adequate for Discharge 02/18/2023 1635 by Emilio Aspen, RN Outcome: Progressing   Problem: Clinical Measurements: Goal:  Postoperative complications will be avoided or minimized 02/18/2023 1635 by Emilio Aspen, RN Outcome: Adequate for Discharge 02/18/2023 1635 by Emilio Aspen, RN Outcome: Progressing   Problem: Self-Concept: Goal: Ability to maintain and perform role responsibilities to the fullest extent possible will improve 02/18/2023 1635 by Emilio Aspen, RN Outcome: Adequate for Discharge 02/18/2023 1635 by Emilio Aspen, RN Outcome: Progressing   Problem: Pain Management: Goal: Pain level will decrease 02/18/2023 1635 by Emilio Aspen, RN Outcome: Adequate for Discharge 02/18/2023 1635 by Emilio Aspen, RN Outcome: Progressing

## 2023-02-19 DIAGNOSIS — S72001D Fracture of unspecified part of neck of right femur, subsequent encounter for closed fracture with routine healing: Secondary | ICD-10-CM | POA: Diagnosis not present

## 2023-02-22 DIAGNOSIS — N3000 Acute cystitis without hematuria: Secondary | ICD-10-CM | POA: Diagnosis not present

## 2023-02-22 DIAGNOSIS — J449 Chronic obstructive pulmonary disease, unspecified: Secondary | ICD-10-CM | POA: Diagnosis not present

## 2023-02-22 DIAGNOSIS — S72001A Fracture of unspecified part of neck of right femur, initial encounter for closed fracture: Secondary | ICD-10-CM | POA: Diagnosis not present

## 2023-02-22 DIAGNOSIS — D649 Anemia, unspecified: Secondary | ICD-10-CM | POA: Diagnosis not present

## 2023-02-24 DIAGNOSIS — R945 Abnormal results of liver function studies: Secondary | ICD-10-CM | POA: Diagnosis not present

## 2023-02-24 DIAGNOSIS — N3000 Acute cystitis without hematuria: Secondary | ICD-10-CM | POA: Diagnosis not present

## 2023-02-24 DIAGNOSIS — D649 Anemia, unspecified: Secondary | ICD-10-CM | POA: Diagnosis not present

## 2023-02-24 DIAGNOSIS — D72829 Elevated white blood cell count, unspecified: Secondary | ICD-10-CM | POA: Diagnosis not present

## 2023-03-01 DIAGNOSIS — R945 Abnormal results of liver function studies: Secondary | ICD-10-CM | POA: Diagnosis not present

## 2023-03-01 DIAGNOSIS — D72829 Elevated white blood cell count, unspecified: Secondary | ICD-10-CM | POA: Diagnosis not present

## 2023-03-01 DIAGNOSIS — N3000 Acute cystitis without hematuria: Secondary | ICD-10-CM | POA: Diagnosis not present

## 2023-03-01 DIAGNOSIS — D649 Anemia, unspecified: Secondary | ICD-10-CM | POA: Diagnosis not present

## 2023-03-05 DIAGNOSIS — N1831 Chronic kidney disease, stage 3a: Secondary | ICD-10-CM | POA: Diagnosis not present

## 2023-03-05 DIAGNOSIS — D62 Acute posthemorrhagic anemia: Secondary | ICD-10-CM | POA: Diagnosis not present

## 2023-03-05 DIAGNOSIS — S72001A Fracture of unspecified part of neck of right femur, initial encounter for closed fracture: Secondary | ICD-10-CM | POA: Diagnosis not present

## 2023-03-09 DIAGNOSIS — N1831 Chronic kidney disease, stage 3a: Secondary | ICD-10-CM | POA: Diagnosis not present

## 2023-03-09 DIAGNOSIS — N3000 Acute cystitis without hematuria: Secondary | ICD-10-CM | POA: Diagnosis not present

## 2023-03-09 DIAGNOSIS — D62 Acute posthemorrhagic anemia: Secondary | ICD-10-CM | POA: Diagnosis not present

## 2023-03-09 DIAGNOSIS — S72001A Fracture of unspecified part of neck of right femur, initial encounter for closed fracture: Secondary | ICD-10-CM | POA: Diagnosis not present

## 2023-03-11 DIAGNOSIS — J4489 Other specified chronic obstructive pulmonary disease: Secondary | ICD-10-CM | POA: Diagnosis not present

## 2023-03-11 DIAGNOSIS — F32A Depression, unspecified: Secondary | ICD-10-CM | POA: Diagnosis not present

## 2023-03-11 DIAGNOSIS — S72001D Fracture of unspecified part of neck of right femur, subsequent encounter for closed fracture with routine healing: Secondary | ICD-10-CM | POA: Diagnosis not present

## 2023-03-11 DIAGNOSIS — D649 Anemia, unspecified: Secondary | ICD-10-CM | POA: Diagnosis not present

## 2023-03-11 DIAGNOSIS — N3000 Acute cystitis without hematuria: Secondary | ICD-10-CM | POA: Diagnosis not present

## 2023-03-11 DIAGNOSIS — F17210 Nicotine dependence, cigarettes, uncomplicated: Secondary | ICD-10-CM | POA: Diagnosis not present

## 2023-03-11 DIAGNOSIS — Z9181 History of falling: Secondary | ICD-10-CM | POA: Diagnosis not present

## 2023-03-11 DIAGNOSIS — Z96641 Presence of right artificial hip joint: Secondary | ICD-10-CM | POA: Diagnosis not present

## 2023-03-15 DIAGNOSIS — S72001D Fracture of unspecified part of neck of right femur, subsequent encounter for closed fracture with routine healing: Secondary | ICD-10-CM | POA: Diagnosis not present

## 2023-03-15 DIAGNOSIS — F17210 Nicotine dependence, cigarettes, uncomplicated: Secondary | ICD-10-CM | POA: Diagnosis not present

## 2023-03-15 DIAGNOSIS — J4489 Other specified chronic obstructive pulmonary disease: Secondary | ICD-10-CM | POA: Diagnosis not present

## 2023-03-15 DIAGNOSIS — F32A Depression, unspecified: Secondary | ICD-10-CM | POA: Diagnosis not present

## 2023-03-15 DIAGNOSIS — D649 Anemia, unspecified: Secondary | ICD-10-CM | POA: Diagnosis not present

## 2023-03-15 DIAGNOSIS — Z9181 History of falling: Secondary | ICD-10-CM | POA: Diagnosis not present

## 2023-03-15 DIAGNOSIS — Z96641 Presence of right artificial hip joint: Secondary | ICD-10-CM | POA: Diagnosis not present

## 2023-03-15 DIAGNOSIS — N3000 Acute cystitis without hematuria: Secondary | ICD-10-CM | POA: Diagnosis not present

## 2023-03-16 ENCOUNTER — Telehealth: Payer: Self-pay | Admitting: Physician Assistant

## 2023-03-16 ENCOUNTER — Telehealth: Payer: Self-pay

## 2023-03-16 DIAGNOSIS — J4489 Other specified chronic obstructive pulmonary disease: Secondary | ICD-10-CM | POA: Diagnosis not present

## 2023-03-16 DIAGNOSIS — M25551 Pain in right hip: Secondary | ICD-10-CM | POA: Diagnosis not present

## 2023-03-16 DIAGNOSIS — Z9181 History of falling: Secondary | ICD-10-CM | POA: Diagnosis not present

## 2023-03-16 DIAGNOSIS — D649 Anemia, unspecified: Secondary | ICD-10-CM | POA: Diagnosis not present

## 2023-03-16 DIAGNOSIS — Z96641 Presence of right artificial hip joint: Secondary | ICD-10-CM | POA: Diagnosis not present

## 2023-03-16 DIAGNOSIS — F32A Depression, unspecified: Secondary | ICD-10-CM | POA: Diagnosis not present

## 2023-03-16 DIAGNOSIS — F17210 Nicotine dependence, cigarettes, uncomplicated: Secondary | ICD-10-CM | POA: Diagnosis not present

## 2023-03-16 DIAGNOSIS — N3000 Acute cystitis without hematuria: Secondary | ICD-10-CM | POA: Diagnosis not present

## 2023-03-16 DIAGNOSIS — S72001D Fracture of unspecified part of neck of right femur, subsequent encounter for closed fracture with routine healing: Secondary | ICD-10-CM | POA: Diagnosis not present

## 2023-03-16 NOTE — Telephone Encounter (Signed)
Home Health Verbal Orders - Caller/Agency: Shirleen from [employer] Number: [phone] Requesting OT Frequency:  1x7

## 2023-03-16 NOTE — Telephone Encounter (Unsigned)
Copied from CRM [id number]. Topic: General - Other >> Mar 16, 2023 11:36 AM Epimenio Foot F wrote: Reason for CRM: Home Health Verbal Orders - Caller/Agency: [facility] Callback Number: [phone]  Requesting OT/PT/Skilled Nursing/Social Work/Speech Therapy: Physical Therapy  Frequency: 2 Week 3 , 1 week 5   Joey says if he doesn't answer the phone the line is secure and the orders can be left on VM

## 2023-03-19 NOTE — Telephone Encounter (Signed)
Verbals given on secured voicemail

## 2023-03-19 NOTE — Telephone Encounter (Signed)
Verbals given on secure line

## 2023-03-23 DIAGNOSIS — S72001D Fracture of unspecified part of neck of right femur, subsequent encounter for closed fracture with routine healing: Secondary | ICD-10-CM | POA: Diagnosis not present

## 2023-03-23 DIAGNOSIS — Z96641 Presence of right artificial hip joint: Secondary | ICD-10-CM | POA: Diagnosis not present

## 2023-03-23 DIAGNOSIS — F32A Depression, unspecified: Secondary | ICD-10-CM | POA: Diagnosis not present

## 2023-03-23 DIAGNOSIS — J4489 Other specified chronic obstructive pulmonary disease: Secondary | ICD-10-CM | POA: Diagnosis not present

## 2023-03-23 DIAGNOSIS — D649 Anemia, unspecified: Secondary | ICD-10-CM | POA: Diagnosis not present

## 2023-03-23 DIAGNOSIS — Z9181 History of falling: Secondary | ICD-10-CM | POA: Diagnosis not present

## 2023-03-23 DIAGNOSIS — F17210 Nicotine dependence, cigarettes, uncomplicated: Secondary | ICD-10-CM | POA: Diagnosis not present

## 2023-03-23 DIAGNOSIS — N3000 Acute cystitis without hematuria: Secondary | ICD-10-CM | POA: Diagnosis not present

## 2023-03-24 DIAGNOSIS — S72001D Fracture of unspecified part of neck of right femur, subsequent encounter for closed fracture with routine healing: Secondary | ICD-10-CM | POA: Diagnosis not present

## 2023-03-24 DIAGNOSIS — Z96641 Presence of right artificial hip joint: Secondary | ICD-10-CM | POA: Diagnosis not present

## 2023-03-24 DIAGNOSIS — N3000 Acute cystitis without hematuria: Secondary | ICD-10-CM | POA: Diagnosis not present

## 2023-03-24 DIAGNOSIS — D649 Anemia, unspecified: Secondary | ICD-10-CM | POA: Diagnosis not present

## 2023-03-24 DIAGNOSIS — F32A Depression, unspecified: Secondary | ICD-10-CM | POA: Diagnosis not present

## 2023-03-24 DIAGNOSIS — Z9181 History of falling: Secondary | ICD-10-CM | POA: Diagnosis not present

## 2023-03-24 DIAGNOSIS — F17210 Nicotine dependence, cigarettes, uncomplicated: Secondary | ICD-10-CM | POA: Diagnosis not present

## 2023-03-24 DIAGNOSIS — J4489 Other specified chronic obstructive pulmonary disease: Secondary | ICD-10-CM | POA: Diagnosis not present

## 2023-03-25 ENCOUNTER — Encounter: Payer: Self-pay | Admitting: Physician Assistant

## 2023-03-25 ENCOUNTER — Ambulatory Visit (INDEPENDENT_AMBULATORY_CARE_PROVIDER_SITE_OTHER): Payer: Medicare Other | Admitting: Physician Assistant

## 2023-03-25 VITALS — BP 112/55 | HR 107

## 2023-03-25 DIAGNOSIS — R600 Localized edema: Secondary | ICD-10-CM

## 2023-03-25 DIAGNOSIS — Z96649 Presence of unspecified artificial hip joint: Secondary | ICD-10-CM | POA: Diagnosis not present

## 2023-03-25 NOTE — Progress Notes (Signed)
Established patient visit   Patient: Molly Leonard   DOB: 1946-08-16   77 y.o. Female  MRN: [id number] Visit Date: 03/25/2023  Today's healthcare provider: Alfredia Ferguson, PA-C   Chief Complaint  Patient presents with   Hospitalization Follow-up    Patient was seen at [facility] 02/05/23-02/18/23 due to closed right hip fracture after a fall. Patient was to follow-up with orthopedic surgery. She reports she has seen ortho and is feeling well since discharged.Reports she has had bilateral ankle edema for 2-3 days    Subjective    HPI  Pt had a mechanical fall 02/09/23 where she broke her right hip, s/p right hemiarthroplasty, d/c from hospital 02/18/23 to rehab. Spent 20 days in rehab, now getting home PT.  Reports she did not get regular PT in rehab, did not move much. Overall feels weak. She is working w/ pt and wants to get stronger. Using walker/wheelchair. Goals to return to work.  Reports overall aching, but denies pain. Reports not wanting to take painkillers. Reports one episode of lower/upper extremity tremors when getting up to use walker. Denies resting tremor or tremor of upper extremity with intentional movement.  Medications: Outpatient Medications Prior to Visit  Medication Sig   acetaminophen (TYLENOL) 325 MG tablet Take 1-2 tablets (325-650 mg total) by mouth every 6 (six) hours as needed for mild pain (pain score 1-3 or temp > 100.5).   albuterol (VENTOLIN HFA) 108 (90 Base) MCG/ACT inhaler Inhale 2 puffs into the lungs every 6 (six) hours as needed for wheezing or shortness of breath.   ALPRAZolam (XANAX) 1 MG tablet TAKE ONE TABLET THREE TIMES A DAY AS NEEDED FOR ANXIETY (Patient taking differently: Take 1 mg by mouth 3 (three) times daily as needed for anxiety.)   FLUoxetine (PROZAC) 20 MG capsule TAKE 2 CAPSULES BY MOUTH DAILY   fluticasone (FLONASE) 50 MCG/ACT nasal spray Place 2 sprays into both nostrils daily.   folic acid (FOLVITE) 1 MG tablet TAKE ONE TABLET BY  MOUTH DAILY.   hydrochlorothiazide (HYDRODIURIL) 12.5 MG tablet TAKE 1 TABLET BY MOUTH DAILY   polyethylene glycol (MIRALAX / GLYCOLAX) 17 g packet Take 17 g by mouth daily.   docusate sodium (COLACE) 100 MG capsule Take 2 capsules (200 mg total) by mouth 2 (two) times daily. (Patient not taking: Reported on 03/25/2023)   enoxaparin (LOVENOX) 40 MG/0.4ML injection Inject 0.4 mLs (40 mg total) into the skin daily for 14 days.   HYDROcodone-acetaminophen (NORCO/VICODIN) 5-325 MG tablet Take 1-2 tablets by mouth every 4 (four) hours as needed for moderate pain (pain score 4-6). (Patient not taking: Reported on 03/25/2023)   No facility-administered medications prior to visit.      Objective    BP (!) 112/55 (BP Location: Left Arm, Patient Position: Sitting, Cuff Size: Normal)   Pulse (!) 107   SpO2 99%   Physical Exam Constitutional:      General: She is awake.     Appearance: She is well-developed.  HENT:     Head: Normocephalic.  Eyes:     Conjunctiva/sclera: Conjunctivae normal.  Cardiovascular:     Rate and Rhythm: Normal rate and regular rhythm.     Heart sounds: Normal heart sounds.  Pulmonary:     Effort: Pulmonary effort is normal.     Breath sounds: Normal breath sounds.  Musculoskeletal:     Right lower leg: 1+ Pitting Edema present.     Left lower leg: 1+ Pitting Edema present.  Skin:    General: Skin is warm.  Neurological:     Mental Status: She is alert and oriented to person, place, and time.  Psychiatric:        Attention and Perception: Attention normal.        Mood and Affect: Mood normal.        Speech: Speech normal.        Behavior: Behavior is cooperative.      No results found for any visits on 03/25/23.  Assessment & Plan     1. S/P hip hemiarthroplasty Encouraged home PT and continuing with exercises and strengthening on a regular basis.  Advised I do not want her in the wheelchair, I want her goal to be using a walker, and then eventually  unassisted if she can!  2. Peripheral edema Likely 2/2 to immobility for the last month. Advised if worsens, to call office, but should improve with more activity.    Return in about 6 months (around 09/25/2023), or if symptoms worsen or fail to improve, for chronic conditions.      I, Alfredia Ferguson, PA-C have reviewed all documentation for this visit. The documentation on  03/25/23   for the exam, diagnosis, procedures, and orders are all accurate and complete. Alfredia Ferguson, PA-C [facility] [address] [place], [place], [address] Office: [phone] Fax: [phone]   [facility]

## 2023-03-31 DIAGNOSIS — Z9181 History of falling: Secondary | ICD-10-CM | POA: Diagnosis not present

## 2023-03-31 DIAGNOSIS — J4489 Other specified chronic obstructive pulmonary disease: Secondary | ICD-10-CM | POA: Diagnosis not present

## 2023-03-31 DIAGNOSIS — F32A Depression, unspecified: Secondary | ICD-10-CM | POA: Diagnosis not present

## 2023-03-31 DIAGNOSIS — D649 Anemia, unspecified: Secondary | ICD-10-CM | POA: Diagnosis not present

## 2023-03-31 DIAGNOSIS — S72001D Fracture of unspecified part of neck of right femur, subsequent encounter for closed fracture with routine healing: Secondary | ICD-10-CM | POA: Diagnosis not present

## 2023-03-31 DIAGNOSIS — Z96641 Presence of right artificial hip joint: Secondary | ICD-10-CM | POA: Diagnosis not present

## 2023-03-31 DIAGNOSIS — F17210 Nicotine dependence, cigarettes, uncomplicated: Secondary | ICD-10-CM | POA: Diagnosis not present

## 2023-03-31 DIAGNOSIS — N3000 Acute cystitis without hematuria: Secondary | ICD-10-CM | POA: Diagnosis not present

## 2023-04-01 DIAGNOSIS — F32A Depression, unspecified: Secondary | ICD-10-CM | POA: Diagnosis not present

## 2023-04-01 DIAGNOSIS — D649 Anemia, unspecified: Secondary | ICD-10-CM | POA: Diagnosis not present

## 2023-04-01 DIAGNOSIS — N3000 Acute cystitis without hematuria: Secondary | ICD-10-CM | POA: Diagnosis not present

## 2023-04-01 DIAGNOSIS — J4489 Other specified chronic obstructive pulmonary disease: Secondary | ICD-10-CM | POA: Diagnosis not present

## 2023-04-01 DIAGNOSIS — Z96641 Presence of right artificial hip joint: Secondary | ICD-10-CM | POA: Diagnosis not present

## 2023-04-01 DIAGNOSIS — Z9181 History of falling: Secondary | ICD-10-CM | POA: Diagnosis not present

## 2023-04-01 DIAGNOSIS — S72001D Fracture of unspecified part of neck of right femur, subsequent encounter for closed fracture with routine healing: Secondary | ICD-10-CM | POA: Diagnosis not present

## 2023-04-01 DIAGNOSIS — F17210 Nicotine dependence, cigarettes, uncomplicated: Secondary | ICD-10-CM | POA: Diagnosis not present

## 2023-04-02 DIAGNOSIS — D649 Anemia, unspecified: Secondary | ICD-10-CM | POA: Diagnosis not present

## 2023-04-02 DIAGNOSIS — Z96641 Presence of right artificial hip joint: Secondary | ICD-10-CM | POA: Diagnosis not present

## 2023-04-02 DIAGNOSIS — F32A Depression, unspecified: Secondary | ICD-10-CM | POA: Diagnosis not present

## 2023-04-02 DIAGNOSIS — J4489 Other specified chronic obstructive pulmonary disease: Secondary | ICD-10-CM | POA: Diagnosis not present

## 2023-04-02 DIAGNOSIS — Z9181 History of falling: Secondary | ICD-10-CM | POA: Diagnosis not present

## 2023-04-02 DIAGNOSIS — S72001D Fracture of unspecified part of neck of right femur, subsequent encounter for closed fracture with routine healing: Secondary | ICD-10-CM | POA: Diagnosis not present

## 2023-04-02 DIAGNOSIS — F17210 Nicotine dependence, cigarettes, uncomplicated: Secondary | ICD-10-CM | POA: Diagnosis not present

## 2023-04-02 DIAGNOSIS — N3000 Acute cystitis without hematuria: Secondary | ICD-10-CM | POA: Diagnosis not present

## 2023-04-05 DIAGNOSIS — F32A Depression, unspecified: Secondary | ICD-10-CM | POA: Diagnosis not present

## 2023-04-05 DIAGNOSIS — S72001D Fracture of unspecified part of neck of right femur, subsequent encounter for closed fracture with routine healing: Secondary | ICD-10-CM | POA: Diagnosis not present

## 2023-04-05 DIAGNOSIS — Z96641 Presence of right artificial hip joint: Secondary | ICD-10-CM | POA: Diagnosis not present

## 2023-04-05 DIAGNOSIS — J4489 Other specified chronic obstructive pulmonary disease: Secondary | ICD-10-CM | POA: Diagnosis not present

## 2023-04-05 DIAGNOSIS — D649 Anemia, unspecified: Secondary | ICD-10-CM | POA: Diagnosis not present

## 2023-04-05 DIAGNOSIS — Z9181 History of falling: Secondary | ICD-10-CM | POA: Diagnosis not present

## 2023-04-05 DIAGNOSIS — F17210 Nicotine dependence, cigarettes, uncomplicated: Secondary | ICD-10-CM | POA: Diagnosis not present

## 2023-04-05 DIAGNOSIS — N3000 Acute cystitis without hematuria: Secondary | ICD-10-CM | POA: Diagnosis not present

## 2023-04-06 DIAGNOSIS — F17210 Nicotine dependence, cigarettes, uncomplicated: Secondary | ICD-10-CM | POA: Diagnosis not present

## 2023-04-06 DIAGNOSIS — F32A Depression, unspecified: Secondary | ICD-10-CM | POA: Diagnosis not present

## 2023-04-06 DIAGNOSIS — D649 Anemia, unspecified: Secondary | ICD-10-CM | POA: Diagnosis not present

## 2023-04-06 DIAGNOSIS — Z9181 History of falling: Secondary | ICD-10-CM | POA: Diagnosis not present

## 2023-04-06 DIAGNOSIS — S72001D Fracture of unspecified part of neck of right femur, subsequent encounter for closed fracture with routine healing: Secondary | ICD-10-CM | POA: Diagnosis not present

## 2023-04-06 DIAGNOSIS — N3000 Acute cystitis without hematuria: Secondary | ICD-10-CM | POA: Diagnosis not present

## 2023-04-06 DIAGNOSIS — Z96641 Presence of right artificial hip joint: Secondary | ICD-10-CM | POA: Diagnosis not present

## 2023-04-06 DIAGNOSIS — J4489 Other specified chronic obstructive pulmonary disease: Secondary | ICD-10-CM | POA: Diagnosis not present

## 2023-04-12 DIAGNOSIS — Z96641 Presence of right artificial hip joint: Secondary | ICD-10-CM | POA: Diagnosis not present

## 2023-04-12 DIAGNOSIS — F32A Depression, unspecified: Secondary | ICD-10-CM | POA: Diagnosis not present

## 2023-04-12 DIAGNOSIS — F17210 Nicotine dependence, cigarettes, uncomplicated: Secondary | ICD-10-CM | POA: Diagnosis not present

## 2023-04-12 DIAGNOSIS — J4489 Other specified chronic obstructive pulmonary disease: Secondary | ICD-10-CM | POA: Diagnosis not present

## 2023-04-12 DIAGNOSIS — D649 Anemia, unspecified: Secondary | ICD-10-CM | POA: Diagnosis not present

## 2023-04-12 DIAGNOSIS — N3000 Acute cystitis without hematuria: Secondary | ICD-10-CM | POA: Diagnosis not present

## 2023-04-12 DIAGNOSIS — Z9181 History of falling: Secondary | ICD-10-CM | POA: Diagnosis not present

## 2023-04-12 DIAGNOSIS — S72001D Fracture of unspecified part of neck of right femur, subsequent encounter for closed fracture with routine healing: Secondary | ICD-10-CM | POA: Diagnosis not present

## 2023-04-14 DIAGNOSIS — F32A Depression, unspecified: Secondary | ICD-10-CM | POA: Diagnosis not present

## 2023-04-14 DIAGNOSIS — Z9181 History of falling: Secondary | ICD-10-CM | POA: Diagnosis not present

## 2023-04-14 DIAGNOSIS — F17210 Nicotine dependence, cigarettes, uncomplicated: Secondary | ICD-10-CM | POA: Diagnosis not present

## 2023-04-14 DIAGNOSIS — N3000 Acute cystitis without hematuria: Secondary | ICD-10-CM | POA: Diagnosis not present

## 2023-04-14 DIAGNOSIS — J4489 Other specified chronic obstructive pulmonary disease: Secondary | ICD-10-CM | POA: Diagnosis not present

## 2023-04-14 DIAGNOSIS — Z96641 Presence of right artificial hip joint: Secondary | ICD-10-CM | POA: Diagnosis not present

## 2023-04-14 DIAGNOSIS — D649 Anemia, unspecified: Secondary | ICD-10-CM | POA: Diagnosis not present

## 2023-04-14 DIAGNOSIS — S72001D Fracture of unspecified part of neck of right femur, subsequent encounter for closed fracture with routine healing: Secondary | ICD-10-CM | POA: Diagnosis not present

## 2023-04-16 DIAGNOSIS — R600 Localized edema: Secondary | ICD-10-CM | POA: Diagnosis not present

## 2023-04-16 DIAGNOSIS — L03115 Cellulitis of right lower limb: Secondary | ICD-10-CM | POA: Diagnosis not present

## 2023-04-19 DIAGNOSIS — F17210 Nicotine dependence, cigarettes, uncomplicated: Secondary | ICD-10-CM | POA: Diagnosis not present

## 2023-04-19 DIAGNOSIS — Z96641 Presence of right artificial hip joint: Secondary | ICD-10-CM | POA: Diagnosis not present

## 2023-04-19 DIAGNOSIS — N3000 Acute cystitis without hematuria: Secondary | ICD-10-CM | POA: Diagnosis not present

## 2023-04-19 DIAGNOSIS — S72001D Fracture of unspecified part of neck of right femur, subsequent encounter for closed fracture with routine healing: Secondary | ICD-10-CM | POA: Diagnosis not present

## 2023-04-19 DIAGNOSIS — J4489 Other specified chronic obstructive pulmonary disease: Secondary | ICD-10-CM | POA: Diagnosis not present

## 2023-04-19 DIAGNOSIS — Z9181 History of falling: Secondary | ICD-10-CM | POA: Diagnosis not present

## 2023-04-19 DIAGNOSIS — F32A Depression, unspecified: Secondary | ICD-10-CM | POA: Diagnosis not present

## 2023-04-19 DIAGNOSIS — D649 Anemia, unspecified: Secondary | ICD-10-CM | POA: Diagnosis not present

## 2023-04-20 DIAGNOSIS — F17210 Nicotine dependence, cigarettes, uncomplicated: Secondary | ICD-10-CM | POA: Diagnosis not present

## 2023-04-20 DIAGNOSIS — N3000 Acute cystitis without hematuria: Secondary | ICD-10-CM | POA: Diagnosis not present

## 2023-04-20 DIAGNOSIS — J4489 Other specified chronic obstructive pulmonary disease: Secondary | ICD-10-CM | POA: Diagnosis not present

## 2023-04-20 DIAGNOSIS — Z96641 Presence of right artificial hip joint: Secondary | ICD-10-CM | POA: Diagnosis not present

## 2023-04-20 DIAGNOSIS — S72001D Fracture of unspecified part of neck of right femur, subsequent encounter for closed fracture with routine healing: Secondary | ICD-10-CM | POA: Diagnosis not present

## 2023-04-20 DIAGNOSIS — F32A Depression, unspecified: Secondary | ICD-10-CM | POA: Diagnosis not present

## 2023-04-20 DIAGNOSIS — Z9181 History of falling: Secondary | ICD-10-CM | POA: Diagnosis not present

## 2023-04-20 DIAGNOSIS — D649 Anemia, unspecified: Secondary | ICD-10-CM | POA: Diagnosis not present

## 2023-04-22 DIAGNOSIS — D649 Anemia, unspecified: Secondary | ICD-10-CM | POA: Diagnosis not present

## 2023-04-22 DIAGNOSIS — N3000 Acute cystitis without hematuria: Secondary | ICD-10-CM | POA: Diagnosis not present

## 2023-04-22 DIAGNOSIS — Z9181 History of falling: Secondary | ICD-10-CM | POA: Diagnosis not present

## 2023-04-22 DIAGNOSIS — F17210 Nicotine dependence, cigarettes, uncomplicated: Secondary | ICD-10-CM | POA: Diagnosis not present

## 2023-04-22 DIAGNOSIS — Z96641 Presence of right artificial hip joint: Secondary | ICD-10-CM | POA: Diagnosis not present

## 2023-04-22 DIAGNOSIS — F32A Depression, unspecified: Secondary | ICD-10-CM | POA: Diagnosis not present

## 2023-04-22 DIAGNOSIS — J4489 Other specified chronic obstructive pulmonary disease: Secondary | ICD-10-CM | POA: Diagnosis not present

## 2023-04-22 DIAGNOSIS — S72001D Fracture of unspecified part of neck of right femur, subsequent encounter for closed fracture with routine healing: Secondary | ICD-10-CM | POA: Diagnosis not present

## 2023-04-27 DIAGNOSIS — N3000 Acute cystitis without hematuria: Secondary | ICD-10-CM | POA: Diagnosis not present

## 2023-04-27 DIAGNOSIS — Z96641 Presence of right artificial hip joint: Secondary | ICD-10-CM | POA: Diagnosis not present

## 2023-04-27 DIAGNOSIS — F32A Depression, unspecified: Secondary | ICD-10-CM | POA: Diagnosis not present

## 2023-04-27 DIAGNOSIS — S72001D Fracture of unspecified part of neck of right femur, subsequent encounter for closed fracture with routine healing: Secondary | ICD-10-CM | POA: Diagnosis not present

## 2023-04-27 DIAGNOSIS — D649 Anemia, unspecified: Secondary | ICD-10-CM | POA: Diagnosis not present

## 2023-04-27 DIAGNOSIS — F17210 Nicotine dependence, cigarettes, uncomplicated: Secondary | ICD-10-CM | POA: Diagnosis not present

## 2023-04-27 DIAGNOSIS — Z9181 History of falling: Secondary | ICD-10-CM | POA: Diagnosis not present

## 2023-04-27 DIAGNOSIS — J4489 Other specified chronic obstructive pulmonary disease: Secondary | ICD-10-CM | POA: Diagnosis not present

## 2023-04-29 DIAGNOSIS — F17210 Nicotine dependence, cigarettes, uncomplicated: Secondary | ICD-10-CM | POA: Diagnosis not present

## 2023-04-29 DIAGNOSIS — F32A Depression, unspecified: Secondary | ICD-10-CM | POA: Diagnosis not present

## 2023-04-29 DIAGNOSIS — J4489 Other specified chronic obstructive pulmonary disease: Secondary | ICD-10-CM | POA: Diagnosis not present

## 2023-04-29 DIAGNOSIS — N3000 Acute cystitis without hematuria: Secondary | ICD-10-CM | POA: Diagnosis not present

## 2023-04-29 DIAGNOSIS — S72001D Fracture of unspecified part of neck of right femur, subsequent encounter for closed fracture with routine healing: Secondary | ICD-10-CM | POA: Diagnosis not present

## 2023-04-29 DIAGNOSIS — D649 Anemia, unspecified: Secondary | ICD-10-CM | POA: Diagnosis not present

## 2023-04-29 DIAGNOSIS — Z9181 History of falling: Secondary | ICD-10-CM | POA: Diagnosis not present

## 2023-04-29 DIAGNOSIS — Z96641 Presence of right artificial hip joint: Secondary | ICD-10-CM | POA: Diagnosis not present

## 2023-05-03 ENCOUNTER — Other Ambulatory Visit: Payer: Self-pay | Admitting: Physician Assistant

## 2023-05-03 ENCOUNTER — Other Ambulatory Visit: Payer: Self-pay | Admitting: Family

## 2023-05-03 DIAGNOSIS — F339 Major depressive disorder, recurrent, unspecified: Secondary | ICD-10-CM

## 2023-05-06 DIAGNOSIS — F17210 Nicotine dependence, cigarettes, uncomplicated: Secondary | ICD-10-CM | POA: Diagnosis not present

## 2023-05-06 DIAGNOSIS — N3000 Acute cystitis without hematuria: Secondary | ICD-10-CM | POA: Diagnosis not present

## 2023-05-06 DIAGNOSIS — S72001D Fracture of unspecified part of neck of right femur, subsequent encounter for closed fracture with routine healing: Secondary | ICD-10-CM | POA: Diagnosis not present

## 2023-05-06 DIAGNOSIS — Z9181 History of falling: Secondary | ICD-10-CM | POA: Diagnosis not present

## 2023-05-06 DIAGNOSIS — J4489 Other specified chronic obstructive pulmonary disease: Secondary | ICD-10-CM | POA: Diagnosis not present

## 2023-05-06 DIAGNOSIS — D649 Anemia, unspecified: Secondary | ICD-10-CM | POA: Diagnosis not present

## 2023-05-06 DIAGNOSIS — Z96641 Presence of right artificial hip joint: Secondary | ICD-10-CM | POA: Diagnosis not present

## 2023-05-25 DIAGNOSIS — Z96641 Presence of right artificial hip joint: Secondary | ICD-10-CM | POA: Diagnosis not present

## 2023-07-02 ENCOUNTER — Other Ambulatory Visit: Payer: Self-pay | Admitting: Physician Assistant

## 2023-07-02 DIAGNOSIS — F419 Anxiety disorder, unspecified: Secondary | ICD-10-CM

## 2023-07-07 ENCOUNTER — Other Ambulatory Visit: Payer: Self-pay | Admitting: Physician Assistant

## 2023-07-07 DIAGNOSIS — F419 Anxiety disorder, unspecified: Secondary | ICD-10-CM

## 2023-07-07 DIAGNOSIS — M25551 Pain in right hip: Secondary | ICD-10-CM | POA: Diagnosis not present

## 2023-07-07 DIAGNOSIS — Z96649 Presence of unspecified artificial hip joint: Secondary | ICD-10-CM | POA: Diagnosis not present

## 2023-07-14 DIAGNOSIS — M25551 Pain in right hip: Secondary | ICD-10-CM | POA: Diagnosis not present

## 2023-07-14 DIAGNOSIS — Z96649 Presence of unspecified artificial hip joint: Secondary | ICD-10-CM | POA: Diagnosis not present

## 2023-07-16 ENCOUNTER — Other Ambulatory Visit: Payer: Self-pay | Admitting: Physician Assistant

## 2023-07-16 DIAGNOSIS — F419 Anxiety disorder, unspecified: Secondary | ICD-10-CM

## 2023-07-16 NOTE — Telephone Encounter (Signed)
Requested medication (s) are due for refill today - no  Requested medication (s) are on the active medication list -yes  Future visit scheduled -no  Last refill: 12/09/22 #90 3RF  Notes to clinic: duplicate request- non delegated Rx  Requested Prescriptions  Pending Prescriptions Disp Refills   ALPRAZolam (XANAX) 1 MG tablet 90 tablet 3     Not Delegated - Psychiatry: Anxiolytics/Hypnotics 2 Failed - 07/16/2023 10:29 AM      Failed - This refill cannot be delegated      Failed - Urine Drug Screen completed in last 360 days      Passed - Patient is not pregnant      Passed - Valid encounter within last 6 months    Recent Outpatient Visits           3 months ago S/P hip hemiarthroplasty   [facility] Alfredia Ferguson, PA-C   12 months ago Depression, recurrent [facility])   [place] [facility] Alfredia Ferguson, PA-C   1 year ago Depression, recurrent [facility])   [place] [facility] Alfredia Ferguson, PA-C   1 year ago Depression, recurrent [facility])   [facility] Alfredia Ferguson, PA-C   1 year ago White coat syndrome with hypertension   [facility] Alfredia Ferguson, [place]                 Requested Prescriptions  Pending Prescriptions Disp Refills   ALPRAZolam (XANAX) 1 MG tablet 90 tablet 3     Not Delegated - Psychiatry: Anxiolytics/Hypnotics 2 Failed - 07/16/2023 10:29 AM      Failed - This refill cannot be delegated      Failed - Urine Drug Screen completed in last 360 days      Passed - Patient is not pregnant      Passed - Valid encounter within last 6 months    Recent Outpatient Visits           3 months ago S/P hip hemiarthroplasty   [facility] Alfredia Ferguson, PA-C   12 months ago Depression, recurrent [facility])   [facility] Alfredia Ferguson, PA-C   1 year ago Depression, recurrent [facility])   [facility] Alfredia Ferguson, PA-C   1 year ago Depression, recurrent [facility])   [facility] Alfredia Ferguson, PA-C   1 year ago White coat syndrome with hypertension   [facility] Alfredia Ferguson, [place]

## 2023-07-16 NOTE — Telephone Encounter (Signed)
Medication Refill - Medication: ALPRAZolam (XANAX) 1 MG tablet [[id number]]   Has the patient contacted their pharmacy? Yes.    (Agent: If yes, when and what did the pharmacy advise?) Contact PCP   Preferred Pharmacy (with phone number or street name): [facility] [address] [place] [address]   Has the patient been seen for an appointment in the last year OR does the patient have an upcoming appointment? Yes.    Agent: Please be advised that RX refills may take up to 3 business days. We ask that you follow-up with your pharmacy.    PT is requesting the nurse give her a call if the medication cannot be refilled or if she needs to come in and see another provider.

## 2023-07-16 NOTE — Telephone Encounter (Signed)
LMTCB to schedule appointment °

## 2023-07-19 ENCOUNTER — Ambulatory Visit (INDEPENDENT_AMBULATORY_CARE_PROVIDER_SITE_OTHER): Payer: Medicare Other | Admitting: Family Medicine

## 2023-07-19 VITALS — BP 128/69 | HR 88 | Temp 97.9°F | Ht 67.0 in | Wt 136.0 lb

## 2023-07-19 DIAGNOSIS — F17211 Nicotine dependence, cigarettes, in remission: Secondary | ICD-10-CM | POA: Diagnosis not present

## 2023-07-19 DIAGNOSIS — F419 Anxiety disorder, unspecified: Secondary | ICD-10-CM | POA: Diagnosis not present

## 2023-07-19 DIAGNOSIS — D539 Nutritional anemia, unspecified: Secondary | ICD-10-CM | POA: Diagnosis not present

## 2023-07-19 DIAGNOSIS — E538 Deficiency of other specified B group vitamins: Secondary | ICD-10-CM | POA: Diagnosis not present

## 2023-07-19 DIAGNOSIS — E7800 Pure hypercholesterolemia, unspecified: Secondary | ICD-10-CM | POA: Diagnosis not present

## 2023-07-19 DIAGNOSIS — F5104 Psychophysiologic insomnia: Secondary | ICD-10-CM

## 2023-07-19 DIAGNOSIS — F321 Major depressive disorder, single episode, moderate: Secondary | ICD-10-CM | POA: Diagnosis not present

## 2023-07-19 MED ORDER — ALPRAZOLAM 0.5 MG PO TABS
ORAL_TABLET | ORAL | 0 refills | Status: DC
Start: 2023-07-19 — End: 2023-08-16

## 2023-07-19 MED ORDER — TRAZODONE HCL 50 MG PO TABS
25.0000 mg | ORAL_TABLET | Freq: Every day | ORAL | 1 refills | Status: DC
Start: 2023-07-19 — End: 2023-12-24

## 2023-07-19 NOTE — Patient Instructions (Signed)
Alprazolam: I am sending 0.5 mg instead of 1 mg pills. Plan to take 2 tablets in the afternoon, then take 1 before bed.       - Try to wait an hour to see if 1 tablet alone is tolerable prior to considering taking a second tablet.

## 2023-07-19 NOTE — Assessment & Plan Note (Signed)
Declined LDCT to screen for lung cancer.

## 2023-07-19 NOTE — Progress Notes (Signed)
Established patient visit   Patient: Molly Leonard   DOB: 15-Feb-1946   77 y.o. Female  MRN: [id number] Visit Date: 07/19/2023  Today's healthcare provider: Sherlyn Hay, DO   Chief Complaint  Patient presents with   Anxiety    Patient is here so she can get he Xanax refilled.  She had fallen and broke her hip in July.   She state she was sent to [facility] after her surgery but did not get any physical therapy while there.  She states she is still having a terrible time getting around due to her hip.  She is currently in physical therapy at [facility].   Subjective    HPI Walks all the time at home without a walker now that she's in physical therapy at [facility].  Her hand slipped as she went out the door and she fell but doesn't believe she was lightheaded/dizzy. Takes alprazolam when she gets home from work (works 0830-1700) and also on the weekends. Takes it before sleep as well to relax.  Anxiety, Follow-up  She was last seen for anxiety 07/22/2021. Changes made at last visit include citalopram was discontinued and patient was started on fluoxetine.  She was also given a refill of alprazolam 1 mg 3 times daily as needed dosing.  -currently on fluoxetine 20 mg BID (one in morning and one in afternoon) along with the Xanax.  Previously, she was on (obtained through record review), along with alprazolam: 2016-2017 - duloxetine 60 mg daily and bupropion 100 mg twice daily  2018 - patient was not taking her duloxetine and was started on citalopram 20 mg daily and sertraline titrated up to 100 mg daily.  - Citalopram was titrated down to 5 mg daily in May 2018, sertraline was stopped and patient was briefly started on Lexapro 5 mg daily in May 2018 (one-time 30-day prescription; this was never increased). -Escitalopram 5 mg daily was continued through her visit in 2022 along with alprazolam 1 mg 3 times daily as needed. 2021 - bupropion XL 150 mg was restarted in March  2021    - It is unclear whether bupropion was stopped in November 2022    She reports excellent compliance with treatment. She reports excellent tolerance of treatment. She is not having side effects.   She feels her anxiety is severe and Unchanged since last visit.  She feels that her anxiety stems significantly from poor sleep.  Symptoms: No chest pain No difficulty concentrating  No dizziness No fatigue  No feelings of losing control Yes insomnia  No irritable No palpitations  Yes panic attacks Yes racing thoughts  No shortness of breath No sweating  No tremors/shakes    GAD-7 Results    03/18/2022    8:16 AM 02/02/2022    9:45 AM 11/04/2021    9:18 AM  GAD-7 Generalized Anxiety Disorder Screening Tool  1. Feeling Nervous, Anxious, or on Edge 2 3 2   2. Not Being Able to Stop or Control Worrying 2 0 3  3. Worrying Too Much About Different Things 2 3 3   4. Trouble Relaxing 2 2 --  5. Being So Restless it's Hard To Sit Still 2 0 0  6. Becoming Easily Annoyed or Irritable 2 2 0  7. Feeling Afraid As If Something Awful Might Happen 2 3 0  Total GAD-7 Score 14 13   Difficulty At Work, Home, or Getting  Along With Others? Not difficult at all Somewhat  difficult     PHQ-9 Scores    03/25/2023   10:50 AM 07/20/2022   10:45 AM 02/02/2022    9:29 AM  PHQ9 SCORE ONLY  PHQ-9 Total Score 9 18 0    ---------------------------------------------------------------------------------------------------      Medications: Outpatient Medications Prior to Visit  Medication Sig   ALPRAZolam (XANAX) 1 MG tablet TAKE ONE TABLET THREE TIMES A DAY AS NEEDED FOR ANXIETY (Patient taking differently: Take 1 mg by mouth 3 (three) times daily as needed for anxiety.)   docusate sodium (COLACE) 100 MG capsule Take 2 capsules (200 mg total) by mouth 2 (two) times daily.   FLUoxetine (PROZAC) 20 MG capsule TAKE 2 CAPSULES BY MOUTH EVERY DAY   fluticasone (FLONASE) 50 MCG/ACT nasal spray Place 2  sprays into both nostrils daily.   folic acid (FOLVITE) 1 MG tablet TAKE ONE TABLET BY MOUTH DAILY.   hydrochlorothiazide (HYDRODIURIL) 12.5 MG tablet TAKE 1 TABLET BY MOUTH DAILY   polyethylene glycol (MIRALAX / GLYCOLAX) 17 g packet Take 17 g by mouth daily.   acetaminophen (TYLENOL) 325 MG tablet Take 1-2 tablets (325-650 mg total) by mouth every 6 (six) hours as needed for mild pain (pain score 1-3 or temp > 100.5). (Patient not taking: Reported on 07/19/2023)   albuterol (VENTOLIN HFA) 108 (90 Base) MCG/ACT inhaler Inhale 2 puffs into the lungs every 6 (six) hours as needed for wheezing or shortness of breath. (Patient not taking: Reported on 07/19/2023)   [DISCONTINUED] enoxaparin (LOVENOX) 40 MG/0.4ML injection Inject 0.4 mLs (40 mg total) into the skin daily for 14 days.   [DISCONTINUED] HYDROcodone-acetaminophen (NORCO/VICODIN) 5-325 MG tablet Take 1-2 tablets by mouth every 4 (four) hours as needed for moderate pain (pain score 4-6). (Patient not taking: Reported on 03/25/2023)   No facility-administered medications prior to visit.        Objective    BP 128/69 (BP Location: Right Arm, Patient Position: Sitting, Cuff Size: Normal)   Pulse 88   Temp 97.9 F (36.6 C) (Oral)   Ht 5\' 7"  (1.702 m)   Wt 136 lb (61.7 kg)   SpO2 99%   BMI 21.30 kg/m     Physical Exam Vitals and nursing note reviewed.  Constitutional:      General: She is not in acute distress.    Appearance: Normal appearance.  HENT:     Head: Normocephalic and atraumatic.  Eyes:     General: No scleral icterus.    Conjunctiva/sclera: Conjunctivae normal.  Cardiovascular:     Rate and Rhythm: Normal rate.  Pulmonary:     Effort: Pulmonary effort is normal.  Neurological:     Mental Status: She is alert and oriented to person, place, and time. Mental status is at baseline.  Psychiatric:        Mood and Affect: Mood normal.        Behavior: Behavior normal.      No results found for any visits on  07/19/23.  Assessment & Plan    Anxiety Assessment & Plan: Stable.  Managed with Prozac 20 mg twice daily and alprazolam, originally 1 mg 3 times daily as needed.  Based on our discussion, she has been using 1 tablet in the afternoon and 1 tablet before bed, predominantly to address insomnia. -Counseled patient on risks associated with long-term alprazolam use, particularly as we become older and our bodies do not process medications as effectively. Will start trazodone for sleep as noted and attempt to taper down alprazolam  use.  Patient expressed understanding and is in agreement with this plan. Prescribed alprazolam 0.5 mg with instructions to continue with original dosing, then start to taper down as tolerated.  Orders: -     ALPRAZolam; Take 2 tablets in the afternoon, then take 1 before bed.      -  May repeat 1 tablet before bed after 1 hour if not effective.  Dispense: 120 tablet; Refill: 0  Psychophysiological insomnia Assessment & Plan: Starting trazodone to address patient's insomnia and attempting to taper down alprazolam use, since her nighttime use was predominantly for insomnia.  Patient expressed understanding and is in agreement with this plan.  Orders: -     traZODone HCl; Take 0.5 tablets (25 mg total) by mouth at bedtime.  Dispense: 15 tablet; Refill: 1 -     ALPRAZolam; Take 2 tablets in the afternoon, then take 1 before bed.      -  May repeat 1 tablet before bed after 1 hour if not effective.  Dispense: 120 tablet; Refill: 0  Depression, major, single episode, moderate (HCC) Assessment & Plan: Stable.  Managed with Prozac 20 mg twice daily. Continue without changes.   Nicotine dependence, cigarettes, in remission Assessment & Plan: Declined LDCT to screen for lung cancer.   Hypercholesterolemia Assessment & Plan: Historically well-controlled with diet and exercise. Ordering CMP and lipid panel today for patient to obtain prior to next visit  Orders: -      Comprehensive metabolic panel -     Lipid panel  Macrocytic anemia Assessment & Plan: Continue folic acid 1 mg daily Will order recheck of CBC today  Orders: -     CBC  Vitamin B12 deficiency -     Vitamin B12   Return in about 2 weeks (around 08/02/2023) for mAWV.     Return in 4 weeks for CPE, Anx/Dep, insomnia    I discussed the assessment and treatment plan with the patient  The patient was provided an opportunity to ask questions and all were answered. The patient agreed with the plan and demonstrated an understanding of the instructions.   The patient was advised to call back or seek an in-person evaluation if the symptoms worsen or if the condition fails to improve as anticipated.  Total time was 45 minutes. That includes chart review before the visit, the actual patient visit, and time spent on documentation after the visit.    Sherlyn Hay, DO  [facility] [phone] (phone) [phone] (fax)  [facility]

## 2023-07-31 ENCOUNTER — Encounter: Payer: Self-pay | Admitting: Family Medicine

## 2023-07-31 DIAGNOSIS — F321 Major depressive disorder, single episode, moderate: Secondary | ICD-10-CM | POA: Insufficient documentation

## 2023-07-31 HISTORY — DX: Major depressive disorder, single episode, moderate: F32.1

## 2023-07-31 NOTE — Assessment & Plan Note (Signed)
Stable.  Managed with Prozac 20 mg twice daily. Continue without changes.

## 2023-07-31 NOTE — Assessment & Plan Note (Signed)
Historically well-controlled with diet and exercise. Ordering CMP and lipid panel today for patient to obtain prior to next visit

## 2023-07-31 NOTE — Assessment & Plan Note (Signed)
Starting trazodone to address patient's insomnia and attempting to taper down alprazolam use, since her nighttime use was predominantly for insomnia.  Patient expressed understanding and is in agreement with this plan.

## 2023-07-31 NOTE — Assessment & Plan Note (Addendum)
Continue folic acid 1 mg daily Will order recheck of CBC today

## 2023-07-31 NOTE — Assessment & Plan Note (Addendum)
Stable.  Managed with Prozac 20 mg twice daily and alprazolam, originally 1 mg 3 times daily as needed.  Based on our discussion, she has been using 1 tablet in the afternoon and 1 tablet before bed, predominantly to address insomnia. -Counseled patient on risks associated with long-term alprazolam use, particularly as we become older and our bodies do not process medications as effectively. Will start trazodone for sleep as noted and attempt to taper down alprazolam use.  Patient expressed understanding and is in agreement with this plan. Prescribed alprazolam 0.5 mg with instructions to continue with original dosing, then start to taper down as tolerated.

## 2023-08-05 ENCOUNTER — Other Ambulatory Visit: Payer: Self-pay | Admitting: Physician Assistant

## 2023-08-10 ENCOUNTER — Ambulatory Visit: Payer: Medicare Other

## 2023-08-10 DIAGNOSIS — Z0000 Encounter for general adult medical examination without abnormal findings: Secondary | ICD-10-CM | POA: Diagnosis not present

## 2023-08-10 NOTE — Progress Notes (Signed)
Subjective:   Molly Leonard is a 77 y.o. female who presents for Medicare Annual (Subsequent) preventive examination.  Visit Complete: Virtual I connected with  Marisa Sprinkles on 08/10/23 by a audio enabled telemedicine application and verified that I am speaking with the correct person using two identifiers.  Patient Location: Home  Provider Location: Office/Clinic  I discussed the limitations of evaluation and management by telemedicine. The patient expressed understanding and agreed to proceed.  Vital Signs: Because this visit was a virtual/telehealth visit, some criteria may be missing or patient reported. Any vitals not documented were not able to be obtained and vitals that have been documented are patient reported.  Cardiac Risk Factors include: advanced age (>15men, >41 women);hypertension     Objective:    There were no vitals filed for this visit. There is no height or weight on file to calculate BMI.     08/10/2023    9:16 AM 02/09/2023    2:48 PM 02/09/2023    8:55 AM 10/28/2021    2:42 PM 10/22/2020    2:17 PM 10/18/2019    2:07 PM 12/20/2017    9:44 AM  Advanced Directives  Does Patient Have a Medical Advance Directive? No No No No No No No  Does patient want to make changes to medical advance directive?       No - Patient declined  Would patient like information on creating a medical advance directive? No - Patient declined No - Patient declined No - Patient declined No - Patient declined No - Patient declined No - Patient declined     Current Medications (verified) Outpatient Encounter Medications as of 08/10/2023  Medication Sig   ALPRAZolam (XANAX) 0.5 MG tablet Take 2 tablets in the afternoon, then take 1 before bed.      -  May repeat 1 tablet before bed after 1 hour if not effective.   ALPRAZolam (XANAX) 1 MG tablet TAKE ONE TABLET THREE TIMES A DAY AS NEEDED FOR ANXIETY (Patient taking differently: Take 1 mg by mouth 3 (three) times daily as needed for anxiety.)    docusate sodium (COLACE) 100 MG capsule Take 2 capsules (200 mg total) by mouth 2 (two) times daily.   FLUoxetine (PROZAC) 20 MG capsule TAKE 2 CAPSULES BY MOUTH EVERY DAY   fluticasone (FLONASE) 50 MCG/ACT nasal spray Place 2 sprays into both nostrils daily.   folic acid (FOLVITE) 1 MG tablet TAKE ONE TABLET BY MOUTH DAILY.   hydrochlorothiazide (HYDRODIURIL) 12.5 MG tablet TAKE 1 TABLET BY MOUTH DAILY   acetaminophen (TYLENOL) 325 MG tablet Take 1-2 tablets (325-650 mg total) by mouth every 6 (six) hours as needed for mild pain (pain score 1-3 or temp > 100.5). (Patient not taking: Reported on 07/19/2023)   albuterol (VENTOLIN HFA) 108 (90 Base) MCG/ACT inhaler Inhale 2 puffs into the lungs every 6 (six) hours as needed for wheezing or shortness of breath. (Patient not taking: Reported on 07/19/2023)   polyethylene glycol (MIRALAX / GLYCOLAX) 17 g packet Take 17 g by mouth daily.   traZODone (DESYREL) 50 MG tablet Take 0.5 tablets (25 mg total) by mouth at bedtime. (Patient not taking: Reported on 08/10/2023)   No facility-administered encounter medications on file as of 08/10/2023.    Allergies (verified) Patient has no known allergies.   History: Past Medical History:  Diagnosis Date   Anxiety    Depression    Past Surgical History:  Procedure Laterality Date   ABDOMINAL HYSTERECTOMY  1973  HIP ARTHROPLASTY Right 02/09/2023   Procedure: ARTHROPLASTY BIPOLAR HIP (HEMIARTHROPLASTY);  Surgeon: Reinaldo Berber, MD;  Location: [facility] ORS;  Service: Orthopedics;  Laterality: Right;   Family History  Problem Relation Age of Onset   Heart disease Mother    Diabetes Mother    Multiple sclerosis Sister    Bone cancer Brother    Social History   Socioeconomic History   Marital status: Married    Spouse name: Not on file   Number of children: 3   Years of education: Not on file   Highest education level: 12th grade  Occupational History   Occupation: Network engineer    Comment: full time   Tobacco Use   Smoking status: Every Day    Current packs/day: 0.75    Types: Cigarettes   Smokeless tobacco: Never  Vaping Use   Vaping status: Former  Substance and Sexual Activity   Alcohol use: No   Drug use: No   Sexual activity: Not Currently  Other Topics Concern   Not on file  Social History Narrative   Not on file   Social Determinants of Health   Financial Resource Strain: Low Risk  (08/10/2023)   Overall Financial Resource Strain (CARDIA)    Difficulty of Paying Living Expenses: Not hard at all  Food Insecurity: No Food Insecurity (08/10/2023)   Hunger Vital Sign    Worried About Running Out of Food in the Last Year: Never true    Ran Out of Food in the Last Year: Never true  Transportation Needs: No Transportation Needs (08/10/2023)   PRAPARE - Administrator, Civil Service (Medical): No    Lack of Transportation (Non-Medical): No  Physical Activity: Sufficiently Active (08/10/2023)   Exercise Vital Sign    Days of Exercise per Week: 5 days    Minutes of Exercise per Session: 30 min  Stress: No Stress Concern Present (08/10/2023)   [employer] of Occupational Health - Occupational Stress Questionnaire    Feeling of Stress : Only a little  Social Connections: Moderately Isolated (08/10/2023)   Social Connection and Isolation Panel [NHANES]    Frequency of Communication with Friends and Family: More than three times a week    Frequency of Social Gatherings with Friends and Family: More than three times a week    Attends Religious Services: Never    Database administrator or Organizations: No    Attends Engineer, structural: Never    Marital Status: Married    Tobacco Counseling Ready to quit: Not Answered Counseling given: Not Answered   Clinical Intake:  Pre-visit preparation completed: Yes  Pain : No/denies pain     BMI - recorded: 21.3 Nutritional Status: BMI of 19-24  Normal Nutritional Risks: None Diabetes:  No  How often do you need to have someone help you when you read instructions, pamphlets, or other written materials from your doctor or pharmacy?: 1 - Never  Interpreter Needed?: No  Information entered by :: Kennedy Bucker, LPN   Activities of Daily Living    08/10/2023    9:17 AM 02/18/2023   11:17 AM  In your present state of health, do you have any difficulty performing the following activities:  Hearing? 0   Vision? 0   Difficulty concentrating or making decisions? 0   Walking or climbing stairs? 0   Dressing or bathing? 0   Doing errands, shopping? 0 0  Preparing Food and eating ? N   Using the Toilet? N  In the past six months, have you accidently leaked urine? N   Do you have problems with loss of bowel control? N   Managing your Medications? N   Managing your Finances? N   Housekeeping or managing your Housekeeping? N     Patient Care Team: Pardue, Monico Blitz, DO as PCP - General (Family Medicine) Domingo Madeira, OD (Optometry)  Indicate any recent Medical Services you may have received from other than [facility] providers in the past year (date may be approximate).     Assessment:   This is a routine wellness examination for Jet.  Hearing/Vision screen Hearing Screening - Comments:: No aids Vision Screening - Comments:: Wears glasses- [facility]   Goals Addressed             This Visit's Progress    Cut out extra servings         Depression Screen    08/10/2023    9:14 AM 03/25/2023   10:50 AM 07/20/2022   10:45 AM 03/18/2022    8:18 AM 02/02/2022    9:29 AM 11/04/2021    9:17 AM 10/28/2021    2:40 PM  PHQ 2/9 Scores  PHQ - 2 Score 2 4 4   0 2 0  PHQ- 9 Score  9 18  0 3   Exception Documentation    Patient refusal       Fall Risk    08/10/2023    9:17 AM 03/25/2023   10:50 AM 07/20/2022   10:46 AM 02/02/2022    9:29 AM 10/28/2021    2:43 PM  Fall Risk   Falls in the past year? 1 1 0 0 0  Number falls in past yr: 0 0 0 0 0  Injury with Fall? 1 1 0  0 0  Risk for fall due to : History of fall(s) History of fall(s) No Fall Risks  No Fall Risks  Follow up Falls prevention discussed;Falls evaluation completed Falls evaluation completed Falls evaluation completed  Falls evaluation completed    MEDICARE RISK AT HOME: Medicare Risk at Home Any stairs in or around the home?: Yes If so, are there any without handrails?: No Home free of loose throw rugs in walkways, pet beds, electrical cords, etc?: Yes Adequate lighting in your home to reduce risk of falls?: Yes Life alert?: No Use of a cane, walker or w/c?: Yes (walker) Grab bars in the bathroom?: Yes Shower chair or bench in shower?: Yes Elevated toilet seat or a handicapped toilet?: Yes  TIMED UP AND GO:  Was the test performed?  No    Cognitive Function:        08/10/2023    9:19 AM  6CIT Screen  What Year? 0 points  What month? 0 points  What time? 0 points  Count back from 20 4 points  Months in reverse 4 points  Repeat phrase 2 points  Total Score 10 points    Immunizations Immunization History  Administered Date(s) Administered   Fluad Quad(high Dose 65+) 06/20/2019, 07/20/2022   Influenza Split 07/22/2016   Influenza, High Dose Seasonal PF 07/12/2015, 06/16/2017   Influenza-Unspecified 06/16/2017, 06/04/2018, 07/14/2021   PFIZER(Purple Top)SARS-COV-2 Vaccination 09/29/2019, 10/20/2019, 07/09/2020   Pneumococcal Conjugate-13 10/06/2013   Pneumococcal Polysaccharide-23 10/17/2014   Tdap 06/17/2011    TDAP status: Due, Education has been provided regarding the importance of this vaccine. Advised may receive this vaccine at local pharmacy or Health Dept. Aware to provide a copy of the vaccination record  if obtained from local pharmacy or Health Dept. Verbalized acceptance and understanding.  Flu Vaccine status: Up to date  Pneumococcal vaccine status: Declined,  Education has been provided regarding the importance of this vaccine but patient still declined.  Advised may receive this vaccine at local pharmacy or Health Dept. Aware to provide a copy of the vaccination record if obtained from local pharmacy or Health Dept. Verbalized acceptance and understanding.   Covid-19 vaccine status: Completed vaccines  Qualifies for Shingles Vaccine? Yes   Zostavax completed No   Shingrix Completed?: No.    Education has been provided regarding the importance of this vaccine. Patient has been advised to call insurance company to determine out of pocket expense if they have not yet received this vaccine. Advised may also receive vaccine at local pharmacy or Health Dept. Verbalized acceptance and understanding.  Screening Tests Health Maintenance  Topic Date Due   Zoster Vaccines- Shingrix (1 of 2) Never done   DEXA SCAN  03/04/2011   DTaP/Tdap/Td (2 - Td or Tdap) 06/16/2021   COVID-19 Vaccine (4 - 2023-24 season) 05/16/2023   INFLUENZA VACCINE  12/13/2023 (Originally 04/15/2023)   Medicare Annual Wellness (AWV)  08/09/2024   Pneumonia Vaccine 35+ Years old  Completed   Hepatitis C Screening  Completed   HPV VACCINES  Aged Out    Health Maintenance  Health Maintenance Due  Topic Date Due   Zoster Vaccines- Shingrix (1 of 2) Never done   DEXA SCAN  03/04/2011   DTaP/Tdap/Td (2 - Td or Tdap) 06/16/2021   COVID-19 Vaccine (4 - 2023-24 season) 05/16/2023    Colorectal cancer screening: No longer required.   Mammogram status: No longer required due to age.  Bone Density status: Completed 03/03/06. Results reflect: Bone density results: OSTEOPENIA. Repeat every 5 years. DECLINED REFERRAL   Lung Cancer Screening: (Low Dose CT Chest recommended if Age 52-80 years, 20 pack-year currently smoking OR have quit w/in 15years.) does not qualify.    Additional Screening:  Hepatitis C Screening: does qualify; Completed 12/20/17  Vision Screening: Recommended annual ophthalmology exams for early detection of glaucoma and other disorders of the eye. Is the  patient up to date with their annual eye exam?  Yes  Who is the provider or what is the name of the office in which the patient attends annual eye exams? [facility] If pt is not established with a provider, would they like to be referred to a provider to establish care? No .   Dental Screening: Recommended annual dental exams for proper oral hygiene   Community Resource Referral / Chronic Care Management: CRR required this visit?  No   CCM required this visit?  No     Plan:     I have personally reviewed and noted the following in the patient's chart:   Medical and social history Use of alcohol, tobacco or illicit drugs  Current medications and supplements including opioid prescriptions. Patient is not currently taking opioid prescriptions. Functional ability and status Nutritional status Physical activity Advanced directives List of other physicians Hospitalizations, surgeries, and ER visits in previous 12 months Vitals Screenings to include cognitive, depression, and falls Referrals and appointments  In addition, I have reviewed and discussed with patient certain preventive protocols, quality metrics, and best practice recommendations. A written personalized care plan for preventive services as well as general preventive health recommendations were provided to patient.     Hal Hope, LPN   [id number]   After Visit Summary: (  MyChart) Due to this being a telephonic visit, the after visit summary with patients personalized plan was offered to patient via MyChart   Nurse Notes: none

## 2023-08-10 NOTE — Patient Instructions (Addendum)
Molly Leonard , Thank you for taking time to come for your Medicare Wellness Visit. I appreciate your ongoing commitment to your health goals. Please review the following plan we discussed and let me know if I can assist you in the future.   Referrals/Orders/Follow-Ups/Clinician Recommendations: none  This is a list of the screening recommended for you and due dates:  Health Maintenance  Topic Date Due   Zoster (Shingles) Vaccine (1 of 2) Never done   DEXA scan (bone density measurement)  03/04/2011   DTaP/Tdap/Td vaccine (2 - Td or Tdap) 06/16/2021   COVID-19 Vaccine (4 - 2023-24 season) 05/16/2023   Flu Shot  12/13/2023*   Medicare Annual Wellness Visit  08/09/2024   Pneumonia Vaccine  Completed   Hepatitis C Screening  Completed   HPV Vaccine  Aged Out  *Topic was postponed. The date shown is not the original due date.    Advanced directives: (ACP Link)Information on Advanced Care Planning can be found at [facility] Directives Advance Health Care Directives ([email or web address]   Next Medicare Annual Wellness Visit scheduled for next year: Yes   08/15/24 @ 8:50 am by video

## 2023-08-16 ENCOUNTER — Ambulatory Visit: Payer: Medicare Other | Admitting: Family Medicine

## 2023-08-16 VITALS — BP 126/55 | HR 96 | Temp 97.6°F | Ht 67.0 in | Wt 135.0 lb

## 2023-08-16 DIAGNOSIS — M8580 Other specified disorders of bone density and structure, unspecified site: Secondary | ICD-10-CM | POA: Diagnosis not present

## 2023-08-16 DIAGNOSIS — J418 Mixed simple and mucopurulent chronic bronchitis: Secondary | ICD-10-CM

## 2023-08-16 DIAGNOSIS — I10 Essential (primary) hypertension: Secondary | ICD-10-CM

## 2023-08-16 DIAGNOSIS — Z0001 Encounter for general adult medical examination with abnormal findings: Secondary | ICD-10-CM

## 2023-08-16 DIAGNOSIS — F419 Anxiety disorder, unspecified: Secondary | ICD-10-CM

## 2023-08-16 DIAGNOSIS — D539 Nutritional anemia, unspecified: Secondary | ICD-10-CM

## 2023-08-16 DIAGNOSIS — F339 Major depressive disorder, recurrent, unspecified: Secondary | ICD-10-CM | POA: Diagnosis not present

## 2023-08-16 DIAGNOSIS — F5104 Psychophysiologic insomnia: Secondary | ICD-10-CM

## 2023-08-16 DIAGNOSIS — Z0000 Encounter for general adult medical examination without abnormal findings: Secondary | ICD-10-CM | POA: Insufficient documentation

## 2023-08-16 DIAGNOSIS — E7800 Pure hypercholesterolemia, unspecified: Secondary | ICD-10-CM | POA: Diagnosis not present

## 2023-08-16 DIAGNOSIS — E538 Deficiency of other specified B group vitamins: Secondary | ICD-10-CM | POA: Diagnosis not present

## 2023-08-16 DIAGNOSIS — Z780 Asymptomatic menopausal state: Secondary | ICD-10-CM | POA: Insufficient documentation

## 2023-08-16 MED ORDER — FLUOXETINE HCL 10 MG PO CAPS
10.0000 mg | ORAL_CAPSULE | Freq: Every day | ORAL | 3 refills | Status: DC
Start: 2023-08-16 — End: 2024-07-21

## 2023-08-16 MED ORDER — FLUTICASONE FUROATE-VILANTEROL 100-25 MCG/ACT IN AEPB
1.0000 | INHALATION_SPRAY | Freq: Every day | RESPIRATORY_TRACT | 3 refills | Status: DC
Start: 2023-08-16 — End: 2023-12-24

## 2023-08-16 MED ORDER — FLUOXETINE HCL 20 MG PO CAPS
20.0000 mg | ORAL_CAPSULE | Freq: Every day | ORAL | 0 refills | Status: DC
Start: 2023-08-16 — End: 2023-09-13

## 2023-08-16 MED ORDER — ALPRAZOLAM 0.5 MG PO TABS: ORAL_TABLET | ORAL | 0 refills | Status: DC

## 2023-08-16 NOTE — Progress Notes (Signed)
Complete physical exam   Patient: Molly Leonard   DOB: [birth year]   77 y.o. Female  MRN: [id number] Visit Date: 08/16/2023  Today's healthcare provider: Sherlyn Hay, DO   Chief Complaint  Patient presents with   Annual Exam    Patient was seen by nurse health advisor for Medicare Annual Wellness on 08/10/23   Subjective    [name] is a 77 y.o. female who presents today for a complete physical exam.  She reports consuming a general diet. The patient does not participate in regular exercise at present. She generally feels fairly well. She reports sleeping ok overall. She does not have additional problems to discuss today.  HPI HPI     Annual Exam    Additional comments: Patient was seen by nurse health advisor for Medicare Annual Wellness on 08/10/23      Last edited by Adline Peals, CMA on 08/16/2023  3:54 PM.     Last mAWV: 08/10/2023  The patient, with a history of chronic obstructive pulmonary disease (COPD), presents with concerns about sleep disturbances and feelings of sadness. She reports a recent change in sleep quality, with increased sensitivity to noise and a general feeling of not sleeping as well as before. The patient also expresses feelings of sadness, which she attributes to the loss of her daughter, but also notes a general unhappiness without a clear cause.  - Did not take the trazodone for her insomnia because a relative who works at a veterinary office told her it's what they use make mean dogs passive and her daughter told her she gave her dog the same medicine and it caused it to sleep all the time.  The patient has been managing her COPD without the use of inhalers, despite experiencing shortness of breath during physical exertion and periods of emotional stress. She reports a history of pneumonia in her youth and a long-term smoking habit, which she has since quit.  In addition to her respiratory concerns, the patient has been dealing with a skin  condition on her legs, for which she has been prescribed an antibiotic. She also reports a history of thrush, which she is keen to avoid in the future.  The patient has been taking alprazolam (Xanax) for anxiety, with a current regimen of two doses in the afternoon and two in the evening. She has also been prescribed fluoxetine, which she has been taking at a dose of 20mg  daily, although she reports feeling more tired when taking two doses.  Patient's previous DEXA scan in 2007 showed osteopenia, and she has declined a repeat scan.  She did suffer a right hip fracture in May 2024 and is now status post right hip replacement  She also reports a history of shingles, but has not yet received the vaccine.  The patient's diet is described as consisting of "real food," with a preference for savory over sweet items. She has a history of weight loss following the death of her daughter, but reports that she has since regained her appetite.  The patient's medication regimen also includes trazodone for sleep, which she has been hesitant to take due to concerns about feeling tired. She has expressed a willingness to try a lower dose of the medication to see if it improves her sleep without causing excessive tiredness.  The patient's overall mood is described as generally low, with a particular sadness related to the loss of her daughter. She reports feeling nervous about medical  appointments, which she believes contributes to her shortness of breath.    Past Medical History:  Diagnosis Date   Anxiety    Depression    Past Surgical History:  Procedure Laterality Date   ABDOMINAL HYSTERECTOMY  1973   HIP ARTHROPLASTY Right 02/09/2023   Procedure: ARTHROPLASTY BIPOLAR HIP (HEMIARTHROPLASTY);  Surgeon: Reinaldo Berber, MD;  Location: [facility] ORS;  Service: Orthopedics;  Laterality: Right;   Social History   Socioeconomic History   Marital status: Married    Spouse name: Not on file   Number of children:  3   Years of education: Not on file   Highest education level: 12th grade  Occupational History   Occupation: Owner    Comment: full time  Tobacco Use   Smoking status: Every Day    Current packs/day: 0.75    Types: Cigarettes   Smokeless tobacco: Never  Vaping Use   Vaping status: Former  Substance and Sexual Activity   Alcohol use: No   Drug use: No   Sexual activity: Not Currently  Other Topics Concern   Not on file  Social History Narrative   Not on file   Social Determinants of Health   Financial Resource Strain: Low Risk  (08/10/2023)   Overall Financial Resource Strain (CARDIA)    Difficulty of Paying Living Expenses: Not hard at all  Food Insecurity: No Food Insecurity (08/10/2023)   Hunger Vital Sign    Worried About Running Out of Food in the Last Year: Never true    Ran Out of Food in the Last Year: Never true  Transportation Needs: No Transportation Needs (08/10/2023)   PRAPARE - Administrator, Civil Service (Medical): No    Lack of Transportation (Non-Medical): No  Physical Activity: Sufficiently Active (08/10/2023)   Exercise Vital Sign    Days of Exercise per Week: 5 days    Minutes of Exercise per Session: 30 min  Stress: No Stress Concern Present (08/10/2023)   [employer] of Occupational Health - Occupational Stress Questionnaire    Feeling of Stress : Only a little  Social Connections: Moderately Isolated (08/10/2023)   Social Connection and Isolation Panel [NHANES]    Frequency of Communication with Friends and Family: More than three times a week    Frequency of Social Gatherings with Friends and Family: More than three times a week    Attends Religious Services: Never    Database administrator or Organizations: No    Attends Banker Meetings: Never    Marital Status: Married  Catering manager Violence: Not At Risk (08/10/2023)   Humiliation, Afraid, Rape, and Kick questionnaire    Fear of Current or Ex-Partner:  No    Emotionally Abused: No    Physically Abused: No    Sexually Abused: No   Family Status  Relation Name Status   Mother  Deceased at age 67   Sister  Deceased at age suicide   Brother  Deceased   Father  Deceased at age 99       ALD   Daughter  Deceased at age 42       breast cancer   Son  Alive   Son  Alive  No partnership data on file   Family History  Problem Relation Age of Onset   Heart disease Mother    Diabetes Mother    Multiple sclerosis Sister    Bone cancer Brother    No Known Allergies  Patient Care Team:  Sherlyn Hay, DO as PCP - General (Family Medicine) Domingo Madeira, OD (Optometry)   Medications: Outpatient Medications Prior to Visit  Medication Sig   docusate sodium (COLACE) 100 MG capsule Take 2 capsules (200 mg total) by mouth 2 (two) times daily.   fluticasone (FLONASE) 50 MCG/ACT nasal spray Place 2 sprays into both nostrils daily.   folic acid (FOLVITE) 1 MG tablet TAKE ONE TABLET BY MOUTH DAILY.   hydrochlorothiazide (HYDRODIURIL) 12.5 MG tablet TAKE 1 TABLET BY MOUTH DAILY   polyethylene glycol (MIRALAX / GLYCOLAX) 17 g packet Take 17 g by mouth daily.   [DISCONTINUED] ALPRAZolam (XANAX) 0.5 MG tablet Take 2 tablets in the afternoon, then take 1 before bed.      -  May repeat 1 tablet before bed after 1 hour if not effective.   [DISCONTINUED] FLUoxetine (PROZAC) 20 MG capsule TAKE 2 CAPSULES BY MOUTH EVERY DAY   traZODone (DESYREL) 50 MG tablet Take 0.5 tablets (25 mg total) by mouth at bedtime. (Patient not taking: Reported on 08/10/2023)   [DISCONTINUED] acetaminophen (TYLENOL) 325 MG tablet Take 1-2 tablets (325-650 mg total) by mouth every 6 (six) hours as needed for mild pain (pain score 1-3 or temp > 100.5). (Patient not taking: Reported on 07/19/2023)   [DISCONTINUED] albuterol (VENTOLIN HFA) 108 (90 Base) MCG/ACT inhaler Inhale 2 puffs into the lungs every 6 (six) hours as needed for wheezing or shortness of breath. (Patient not taking:  Reported on 07/19/2023)   [DISCONTINUED] ALPRAZolam (XANAX) 1 MG tablet TAKE ONE TABLET THREE TIMES A DAY AS NEEDED FOR ANXIETY (Patient taking differently: Take 1 mg by mouth 3 (three) times daily as needed for anxiety.)   No facility-administered medications prior to visit.    Review of Systems  Constitutional:  Negative for appetite change, chills, fatigue and fever.  HENT:  Negative for congestion, ear pain and sore throat.   Respiratory:  Negative for chest tightness and shortness of breath.   Cardiovascular:  Negative for chest pain and palpitations.  Gastrointestinal:  Negative for abdominal pain, nausea and vomiting.  Neurological:  Negative for dizziness and weakness.  Psychiatric/Behavioral:  Positive for dysphoric mood and sleep disturbance. The patient is nervous/anxious.       Objective    BP (!) 126/55 (BP Location: Left Arm, Patient Position: Sitting, Cuff Size: Normal)   Pulse 96   Temp 97.6 F (36.4 C) (Oral)   Ht 5\' 7"  (1.702 m)   Wt 135 lb (61.2 kg)   SpO2 100%   BMI 21.14 kg/m    Physical Exam Vitals and nursing note reviewed.  Constitutional:      General: She is awake.     Appearance: Normal appearance.  HENT:     Head: Normocephalic and atraumatic.     Right Ear: Tympanic membrane, ear canal and external ear normal.     Left Ear: Tympanic membrane, ear canal and external ear normal.     Nose: Nose normal.     Mouth/Throat:     Mouth: Mucous membranes are moist.     Pharynx: Oropharynx is clear. No oropharyngeal exudate or posterior oropharyngeal erythema.  Eyes:     General: No scleral icterus.    Extraocular Movements: Extraocular movements intact.     Conjunctiva/sclera: Conjunctivae normal.     Pupils: Pupils are equal, round, and reactive to light.  Neck:     Thyroid: No thyromegaly or thyroid tenderness.  Cardiovascular:     Rate and Rhythm: Normal rate  and regular rhythm.     Pulses: Normal pulses.     Heart sounds: Normal heart sounds.   Pulmonary:     Effort: Pulmonary effort is normal. No tachypnea, bradypnea or respiratory distress.     Breath sounds: Normal breath sounds. No stridor. No wheezing, rhonchi or rales.  Abdominal:     General: Bowel sounds are normal. There is no distension.     Palpations: Abdomen is soft. There is no mass.     Tenderness: There is no abdominal tenderness. There is no guarding.     Hernia: No hernia is present.  Musculoskeletal:     Cervical back: Normal range of motion and neck supple.     Right lower leg: No edema.     Left lower leg: No edema.  Lymphadenopathy:     Cervical: No cervical adenopathy.  Skin:    General: Skin is warm and dry.  Neurological:     Mental Status: She is alert and oriented to person, place, and time. Mental status is at baseline.  Psychiatric:        Mood and Affect: Mood normal.        Behavior: Behavior normal.      Last depression screening scores    08/10/2023    9:14 AM 03/25/2023   10:50 AM 07/20/2022   10:45 AM  PHQ 2/9 Scores  PHQ - 2 Score 2 4 4   PHQ- 9 Score  9 18   Last fall risk screening    08/10/2023    9:17 AM  Fall Risk   Falls in the past year? 1  Number falls in past yr: 0  Injury with Fall? 1  Risk for fall due to : History of fall(s)  Follow up Falls prevention discussed;Falls evaluation completed   Last Audit-C alcohol use screening    08/10/2023    9:13 AM  Alcohol Use Disorder Test (AUDIT)  1. How often do you have a drink containing alcohol? 0  2. How many drinks containing alcohol do you have on a typical day when you are drinking? 0  3. How often do you have six or more drinks on one occasion? 0  AUDIT-C Score 0   A score of 3 or more in women, and 4 or more in men indicates increased risk for alcohol abuse, EXCEPT if all of the points are from question 1   No results found for any visits on 08/16/23.  Assessment & Plan    Routine Health Maintenance and Physical Exam  Exercise Activities and Dietary  recommendations  Goals      Cut out extra servings     DIET - EAT MORE FRUITS AND VEGETABLES     DIET - INCREASE WATER INTAKE     Recommend to start drinking 3 glasses of water a day.       Quit Smoking     Recommend to continue efforts to reduce smoking habits until no longer smoking.         Immunization History  Administered Date(s) Administered   Fluad Quad(high Dose 65+) 06/20/2019, 07/20/2022   Influenza Split 07/22/2016   Influenza, High Dose Seasonal PF 07/12/2015, 06/16/2017   Influenza-Unspecified 06/16/2017, 06/04/2018, 07/14/2021   PFIZER(Purple Top)SARS-COV-2 Vaccination 09/29/2019, 10/20/2019, 07/09/2020   Pneumococcal Conjugate-13 10/06/2013   Pneumococcal Polysaccharide-23 10/17/2014   Tdap 06/17/2011    Health Maintenance  Topic Date Due   DTaP/Tdap/Td (2 - Td or Tdap) 12/13/2023 (Originally 06/16/2021)   INFLUENZA VACCINE  12/13/2023 (  Originally 04/15/2023)   Zoster Vaccines- Shingrix (1 of 2) 12/13/2023 (Originally 12/21/1964)   COVID-19 Vaccine (4 - 2023-24 season) 05/15/2024 (Originally 05/16/2023)   DEXA SCAN  08/25/2024 (Originally 03/04/2011)   Medicare Annual Wellness (AWV)  08/09/2024   Pneumonia Vaccine 80+ Years old  Completed   Hepatitis C Screening  Completed   HPV VACCINES  Aged Out    Discussed health benefits of physical activity, and encouraged her to engage in regular exercise appropriate for her age and condition.   Annual physical exam Assessment & Plan: Physical exam overall unremarkable except as noted above. Routine lab work ordered previously; requisition reprinted for patient to have lab work drawn today.   Depression, recurrent (HCC) Assessment & Plan: Reports sadness, anxiety about her daughter, and poor sleep quality. Currently on fluoxetine but only intermittently takes two pills, due to increased fatigue at the higher dose. Discussed increasing fluoxetine to 30 mg daily and potential use of trazodone or Seroquel for sleep. -  Increase fluoxetine to 30 mg daily (20 mg in the morning, 10 mg in the afternoon) - Start trazodone for sleep, beginning with a half tablet and considering a quarter tablet if side effects occur - Consider Seroquel for sleep if trazodone is ineffective  Orders: -     FLUoxetine HCl; Take 1 capsule (10 mg total) by mouth daily. Take with fluoxetine 20 mg dose  Dispense: 90 capsule; Refill: 3 -     FLUoxetine HCl; Take 1 capsule (20 mg total) by mouth daily. Take with fluoxetine 10 mg dose  Dispense: 30 capsule; Refill: 0  Anxiety Assessment & Plan: Addressed as noted under Depression  Orders: -     ALPRAZolam; Take 2 tablets in the afternoon, then take 1 before bed.      -  May repeat 1 tablet before bed after 1 hour if not effective.  Dispense: 120 tablet; Refill: 0  White coat syndrome with hypertension Assessment & Plan: Blood pressure well-controlled today at 126/55. No home monitoring reported. Continue hydrochlorothiazide 12.5 mg daily.   Osteopenia, unspecified location  Mixed simple and mucopurulent chronic bronchitis (HCC) Assessment & Plan: COPD likely due to long-term smoking (one pack per day since age 2). Reports dyspnea on exertion, especially when anxious. Not using inhalers. Discussed prescribing Symbicort and importance of rinsing mouth post-use to prevent thrush. - Prescribe Symbicort inhaler, one puff daily - Instruct to report if inhaler is unaffordable or not covered by insurance - Emphasize rinsing mouth after use to prevent thrush  Orders: -     Fluticasone Furoate-Vilanterol; Inhale 1 puff into the lungs daily.  Dispense: 1 each; Refill: 3  Psychophysiological insomnia -     ALPRAZolam; Take 2 tablets in the afternoon, then take 1 before bed.      -  May repeat 1 tablet before bed after 1 hour if not effective.  Dispense: 120 tablet; Refill: 0  General Health Maintenance Vaccinations and screenings reviewed. Received flu shot, interested in RSV vaccine.  Last tetanus shot in 2012, no shingles vaccine despite history of shingles. Discussed Prevnar 20 vaccine. Encouraged to get RSV, shingles and tetanus vaccines at pharmacy, due to insurance requirements. - Administer Tdap vaccine - Administer Shingrix vaccine - Discuss Prevnar 20 vaccine - Encourage RSV vaccine with spouse   Return in about 2 months (around 10/17/2023) for Anx/Dep.     I discussed the assessment and treatment plan with the patient  The patient was provided an opportunity to ask questions and all were  answered. The patient agreed with the plan and demonstrated an understanding of the instructions.   The patient was advised to call back or seek an in-person evaluation if the symptoms worsen or if the condition fails to improve as anticipated.    Sherlyn Hay, DO  [facility] [phone] (phone) [phone] (fax)  [facility]

## 2023-08-16 NOTE — Assessment & Plan Note (Addendum)
Reports sadness, anxiety about her daughter, and poor sleep quality. Currently on fluoxetine but only intermittently takes two pills, due to increased fatigue at the higher dose. Discussed increasing fluoxetine to 30 mg daily and potential use of trazodone or Seroquel for sleep. - Increase fluoxetine to 30 mg daily (20 mg in the morning, 10 mg in the afternoon) - Start trazodone for sleep, beginning with a half tablet and considering a quarter tablet if side effects occur - Consider Seroquel for sleep if trazodone is ineffective

## 2023-08-16 NOTE — Assessment & Plan Note (Addendum)
Blood pressure well-controlled today at 126/55. No home monitoring reported. Continue hydrochlorothiazide 12.5 mg daily.

## 2023-08-16 NOTE — Patient Instructions (Addendum)
Recommend getting Tdap (tetatnus), Shingrix (shingles) and RSV vaccines.

## 2023-08-16 NOTE — Assessment & Plan Note (Addendum)
Addressed as noted under Depression

## 2023-08-17 LAB — COMPREHENSIVE METABOLIC PANEL
ALT: 14 [IU]/L (ref 0–32)
AST: 21 [IU]/L (ref 0–40)
Albumin: 4.3 g/dL (ref 3.8–4.8)
Alkaline Phosphatase: 118 [IU]/L (ref 44–121)
BUN/Creatinine Ratio: 28 (ref 12–28)
BUN: 34 mg/dL — ABNORMAL HIGH (ref 8–27)
Bilirubin Total: <0.2 mg/dL (ref 0.0–1.2)
CO2: 24 mmol/L (ref 20–29)
Calcium: 9.5 mg/dL (ref 8.7–10.3)
Chloride: 99 mmol/L (ref 96–106)
Creatinine, Ser: 1.22 mg/dL — ABNORMAL HIGH (ref 0.57–1.00)
Globulin, Total: 2.6 g/dL (ref 1.5–4.5)
Glucose: 117 mg/dL — ABNORMAL HIGH (ref 70–99)
Potassium: 3.9 mmol/L (ref 3.5–5.2)
Sodium: 140 mmol/L (ref 134–144)
Total Protein: 6.9 g/dL (ref 6.0–8.5)
eGFR: 46 mL/min/{1.73_m2} — ABNORMAL LOW (ref >59)

## 2023-08-17 LAB — CBC
Hematocrit: 35.7 % (ref 34.0–46.6)
Hemoglobin: 11.5 g/dL (ref 11.1–15.9)
MCH: 30.4 pg (ref 26.6–33.0)
MCHC: 32.2 g/dL (ref 31.5–35.7)
MCV: 94 fL (ref 79–97)
Platelets: 414 10*3/uL (ref 150–450)
RBC: 3.78 x10E6/uL (ref 3.77–5.28)
RDW: 12.7 % (ref 11.7–15.4)
WBC: 8.1 10*3/uL (ref 3.4–10.8)

## 2023-08-17 LAB — LIPID PANEL
Chol/HDL Ratio: 6.8 {ratio} — ABNORMAL HIGH (ref 0.0–4.4)
Cholesterol, Total: 231 mg/dL — ABNORMAL HIGH (ref 100–199)
HDL: 34 mg/dL — ABNORMAL LOW (ref >39)
LDL Chol Calc (NIH): 134 mg/dL — ABNORMAL HIGH (ref 0–99)
Triglycerides: 351 mg/dL — ABNORMAL HIGH (ref 0–149)
VLDL Cholesterol Cal: 63 mg/dL — ABNORMAL HIGH (ref 5–40)

## 2023-08-17 LAB — VITAMIN B12: Vitamin B-12: 269 pg/mL (ref 232–1245)

## 2023-08-26 ENCOUNTER — Encounter: Payer: Self-pay | Admitting: Family Medicine

## 2023-08-26 NOTE — Assessment & Plan Note (Signed)
COPD likely due to long-term smoking (one pack per day since age 77). Reports dyspnea on exertion, especially when anxious. Not using inhalers. Discussed prescribing Symbicort and importance of rinsing mouth post-use to prevent thrush. - Prescribe Symbicort inhaler, one puff daily - Instruct to report if inhaler is unaffordable or not covered by insurance - Emphasize rinsing mouth after use to prevent thrush

## 2023-08-26 NOTE — Assessment & Plan Note (Signed)
Physical exam overall unremarkable except as noted above. Routine lab work ordered previously; requisition reprinted for patient to have lab work drawn today.

## 2023-09-11 ENCOUNTER — Other Ambulatory Visit: Payer: Self-pay | Admitting: Family Medicine

## 2023-09-11 DIAGNOSIS — F339 Major depressive disorder, recurrent, unspecified: Secondary | ICD-10-CM

## 2023-10-05 DIAGNOSIS — Z96641 Presence of right artificial hip joint: Secondary | ICD-10-CM | POA: Diagnosis not present

## 2023-10-18 ENCOUNTER — Ambulatory Visit: Payer: Self-pay | Admitting: Family Medicine

## 2023-10-25 ENCOUNTER — Ambulatory Visit: Payer: Medicare Other | Admitting: Family Medicine

## 2023-10-25 ENCOUNTER — Encounter: Payer: Self-pay | Admitting: Family Medicine

## 2023-10-25 VITALS — BP 124/52 | HR 81 | Ht 67.0 in | Wt 138.0 lb

## 2023-10-25 DIAGNOSIS — F419 Anxiety disorder, unspecified: Secondary | ICD-10-CM

## 2023-10-25 DIAGNOSIS — Z5320 Procedure and treatment not carried out because of patient's decision for unspecified reasons: Secondary | ICD-10-CM | POA: Diagnosis not present

## 2023-10-25 DIAGNOSIS — J309 Allergic rhinitis, unspecified: Secondary | ICD-10-CM

## 2023-10-25 DIAGNOSIS — Z09 Encounter for follow-up examination after completed treatment for conditions other than malignant neoplasm: Secondary | ICD-10-CM | POA: Diagnosis not present

## 2023-10-25 DIAGNOSIS — E7800 Pure hypercholesterolemia, unspecified: Secondary | ICD-10-CM | POA: Diagnosis not present

## 2023-10-25 DIAGNOSIS — R251 Tremor, unspecified: Secondary | ICD-10-CM | POA: Diagnosis not present

## 2023-10-25 DIAGNOSIS — J418 Mixed simple and mucopurulent chronic bronchitis: Secondary | ICD-10-CM | POA: Diagnosis not present

## 2023-10-25 DIAGNOSIS — F339 Major depressive disorder, recurrent, unspecified: Secondary | ICD-10-CM

## 2023-10-25 DIAGNOSIS — Z96641 Presence of right artificial hip joint: Secondary | ICD-10-CM

## 2023-10-25 DIAGNOSIS — F5104 Psychophysiologic insomnia: Secondary | ICD-10-CM

## 2023-10-25 DIAGNOSIS — N1832 Chronic kidney disease, stage 3b: Secondary | ICD-10-CM

## 2023-10-25 MED ORDER — ATORVASTATIN CALCIUM 10 MG PO TABS
10.0000 mg | ORAL_TABLET | Freq: Every day | ORAL | 3 refills | Status: DC
Start: 2023-10-25 — End: 2023-12-24

## 2023-10-25 MED ORDER — FLUTICASONE PROPIONATE 50 MCG/ACT NA SUSP
2.0000 | Freq: Every day | NASAL | 6 refills | Status: AC
Start: 2023-10-25 — End: ?

## 2023-10-25 MED ORDER — ALPRAZOLAM 0.5 MG PO TABS: ORAL_TABLET | ORAL | 0 refills | Status: DC

## 2023-10-25 NOTE — Assessment & Plan Note (Signed)
 Reports intermittent tremors, particularly when tired or nervous. Tremors have improved with exercise. Recommended continuing exercises to strengthen her hip and overall body. Discussed that gradual increase in physical activity can help manage tremors.   - Continue prescribed exercises for hip and general strength   - Encourage gradual increase in physical activity

## 2023-10-25 NOTE — Assessment & Plan Note (Signed)
 Recovering from a hip fracture and uses a walker for stability. Has exercises prescribed by her therapist and is encouraged to continue them to regain strength. Discussed that continuing these exercises and gradually increasing physical activity can help improve her condition.   - Continue prescribed hip exercises   - Use walker for stability when walking outside

## 2023-10-25 NOTE — Assessment & Plan Note (Signed)
 Quit smoking over a year ago. Discussed the option of a low-dose CT scan for lung cancer screening, but she declined.

## 2023-10-25 NOTE — Assessment & Plan Note (Signed)
 Cholesterol levels are elevated (total cholesterol 231 mg/dL, LDL 94 mg/dL). Previously on a statin (10 mg) but discontinued it years ago. Hesitant about dietary changes due to husband's eating habits and doubts the effectiveness of diet alone. Discussed limited long-term benefits of statins in older adults but agreed to restart atorvastatin  10 mg. Explained that reducing cholesterol through diet and exercise can help but may be challenging given her circumstances.   - Prescribe atorvastatin  10 mg at night   - Discuss dietary modifications to reduce cholesterol, including reducing greasy, fried, and fatty foods, and using less oil in cooking   - Encourage regular exercise as tolerated

## 2023-10-25 NOTE — Assessment & Plan Note (Signed)
 Stable. Continue fluoxetine  and alprazolam  as noted.

## 2023-10-25 NOTE — Assessment & Plan Note (Signed)
 Requires a refill of alprazolam  for management of insomnia s/t anxiety. Takes 2 tablets in the afternoon and sometimes an additional one at night if unable to sleep. Has not yet tried the trazodone  prescribed for nighttime use due to concerns about combining it with alprazolam . Discussed that it is safe to take trazodone  at night even if alprazolam  is taken in the afternoon.   - Refill alprazolam    - Encourage trial of trazodone  at night, starting with a half tablet

## 2023-10-25 NOTE — Assessment & Plan Note (Signed)
 Uses fluticasone  nasal spray for sinus issues, which she finds effective in relieving symptoms.   - Refill fluticasone  nasal spray

## 2023-10-25 NOTE — Assessment & Plan Note (Signed)
 On fluoxetine  for depression, which was been increased to 30 mg. Reports that the current dose is effective and prefers to continue with it.   - Continue fluoxetine  30 mg

## 2023-10-25 NOTE — Assessment & Plan Note (Signed)
 Uses Breo Ellipta  inhaler for COPD management. Has been able to obtain the medication despite its cost.   - Continue Breo Ellipta  inhaler

## 2023-10-25 NOTE — Assessment & Plan Note (Signed)
 Noted.  No acute concerns.  Continue to monitor.

## 2023-10-25 NOTE — Progress Notes (Signed)
 Established patient visit   Patient: Molly Leonard   DOB: [birth year]   78 y.o. Female  MRN: [id number] Visit Date: 10/25/2023  Today's healthcare provider: Carlean Charter, DO   Chief Complaint  Patient presents with   Anxiety    Patient was last seen in December.  She presents today for refill on her Alprazolam .   Subjective    HPI Molly Leonard is a 78 year old female who presents for medication refills and management of multiple chronic conditions.  She is seeking refills for her medications, including alprazolam  and fluticasone  nasal spray. She refers to alprazolam  as 'nerve pills' and typically takes two in the afternoon, sometimes a third if she has trouble sleeping. She has not yet tried the trazodone  that was recently prescribed. Fluticasone  nasal spray is effective for her sinus issues and associated headaches.  She experiences anxiety, for which she takes alprazolam . She takes two tablets in the afternoon and sometimes a third if she cannot sleep. She also experiences tremors, which may be exacerbated by anxiety and nervousness, particularly when visiting the doctor.  She has a history of high cholesterol and was previously on a 10 mg atorvastatin  for it, which she stopped a long time ago. Her cholesterol levels are currently higher than before. She attributes some weight gain to her depression medication, fluoxetine , which has increased her appetite. She is currently on a 30 mg of fluoxetine , which she finds more manageable.  She reports that the current dose is effective.  She experiences tremors, particularly when nervous or tired, and notes that these were more pronounced after returning from the hospital. She finds that exercise helps reduce the tremors. She uses a walker for outdoor activities to prevent falls due to a previous hip fracture.  She has a history of smoking, having quit over a year ago after starting in her late teens.     Medications: Outpatient  Medications Prior to Visit  Medication Sig   FLUoxetine  (PROZAC ) 10 MG capsule Take 1 capsule (10 mg total) by mouth daily. Take with fluoxetine  20 mg dose   FLUoxetine  (PROZAC ) 20 MG capsule TAKE 1 CAPSULE BY MOUTH DAILY. TAKE WITHFLUOXETINE 10MG  DOSE   fluticasone  furoate-vilanterol (BREO ELLIPTA ) 100-25 MCG/ACT AEPB Inhale 1 puff into the lungs daily.   folic acid  (FOLVITE ) 1 MG tablet TAKE ONE TABLET BY MOUTH DAILY.   hydrochlorothiazide  (HYDRODIURIL ) 12.5 MG tablet TAKE 1 TABLET BY MOUTH DAILY   [DISCONTINUED] ALPRAZolam  (XANAX ) 0.5 MG tablet Take 2 tablets in the afternoon, then take 1 before bed.      -  May repeat 1 tablet before bed after 1 hour if not effective.   [DISCONTINUED] fluticasone  (FLONASE ) 50 MCG/ACT nasal spray Place 2 sprays into both nostrils daily.   traZODone  (DESYREL ) 50 MG tablet Take 0.5 tablets (25 mg total) by mouth at bedtime. (Patient not taking: Reported on 10/25/2023)   [DISCONTINUED] docusate sodium  (COLACE) 100 MG capsule Take 2 capsules (200 mg total) by mouth 2 (two) times daily.   [DISCONTINUED] polyethylene glycol (MIRALAX  / GLYCOLAX ) 17 g packet Take 17 g by mouth daily.   No facility-administered medications prior to visit.        Objective    BP (!) 124/52 (BP Location: Left Arm, Patient Position: Sitting, Cuff Size: Normal)   Pulse 81   Ht 5\' 7"  (1.702 m)   Wt 138 lb (62.6 kg)   BMI 21.61 kg/m    The 10-year ASCVD risk  score (Arnett DK, et al., 2019) is: 21.6%   Values used to calculate the score:     Age: 74 years     Sex: Female     Is Non-Hispanic African American: No     Diabetic: No     Tobacco smoker: No     Systolic Blood Pressure: 124 mmHg     Is BP treated: Yes     HDL Cholesterol: 34 mg/dL     Total Cholesterol: 231 mg/dL   Physical Exam Vitals and nursing note reviewed.  Constitutional:      General: She is not in acute distress.    Appearance: Normal appearance.  HENT:     Head: Normocephalic and atraumatic.   Eyes:     General: No scleral icterus.    Conjunctiva/sclera: Conjunctivae normal.  Cardiovascular:     Rate and Rhythm: Normal rate.  Pulmonary:     Effort: Pulmonary effort is normal.  Neurological:     Mental Status: She is alert and oriented to person, place, and time. Mental status is at baseline.  Psychiatric:        Mood and Affect: Mood normal.        Behavior: Behavior normal.      No results found for any visits on 10/25/23.  Assessment & Plan    Depression, recurrent (HCC) Assessment & Plan: On fluoxetine  for depression, which was been increased to 30 mg. Reports that the current dose is effective and prefers to continue with it.   - Continue fluoxetine  30 mg     Follow-up exam, less than 3 months since previous exam  Mixed simple and mucopurulent chronic bronchitis (HCC) Assessment & Plan: Uses Breo Ellipta  inhaler for COPD management. Has been able to obtain the medication despite its cost.   - Continue Breo Ellipta  inhaler     Anxiety Assessment & Plan: Stable. Continue fluoxetine  and alprazolam  as noted.  Orders: -     ALPRAZolam ; Take 2 tablets in the afternoon, then take 1 before bed.      -  May repeat 1 tablet before bed after 1 hour if not effective.  Dispense: 120 tablet; Refill: 0  Psychophysiological insomnia Assessment & Plan: Requires a refill of alprazolam  for management of insomnia s/t anxiety. Takes 2 tablets in the afternoon and sometimes an additional one at night if unable to sleep. Has not yet tried the trazodone  prescribed for nighttime use due to concerns about combining it with alprazolam . Discussed that it is safe to take trazodone  at night even if alprazolam  is taken in the afternoon.   - Refill alprazolam    - Encourage trial of trazodone  at night, starting with a half tablet    Orders: -     ALPRAZolam ; Take 2 tablets in the afternoon, then take 1 before bed.      -  May repeat 1 tablet before bed after 1 hour if not effective.   Dispense: 120 tablet; Refill: 0  Stage 3b chronic kidney disease (CKD) (HCC) Assessment & Plan: Noted.  No acute concerns.  Continue to monitor.   Hypercholesterolemia Assessment & Plan: Cholesterol levels are elevated (total cholesterol 231 mg/dL, LDL 94 mg/dL). Previously on a statin (10 mg) but discontinued it years ago. Hesitant about dietary changes due to husband's eating habits and doubts the effectiveness of diet alone. Discussed limited long-term benefits of statins in older adults but agreed to restart atorvastatin  10 mg. Explained that reducing cholesterol through diet and exercise can help but  may be challenging given her circumstances.   - Prescribe atorvastatin  10 mg at night   - Discuss dietary modifications to reduce cholesterol, including reducing greasy, fried, and fatty foods, and using less oil in cooking   - Encourage regular exercise as tolerated    Orders: -     Atorvastatin  Calcium ; Take 1 tablet (10 mg total) by mouth at bedtime.  Dispense: 90 tablet; Refill: 3  Lung cancer screening declined by patient Assessment & Plan: Quit smoking over a year ago. Discussed the option of a low-dose CT scan for lung cancer screening, but she declined.     Allergic rhinitis, unspecified seasonality, unspecified trigger Assessment & Plan: Uses fluticasone  nasal spray for sinus issues, which she finds effective in relieving symptoms.   - Refill fluticasone  nasal spray    Orders: -     Fluticasone  Propionate; Place 2 sprays into both nostrils daily.  Dispense: 16 g; Refill: 6  Tremor Assessment & Plan: Reports intermittent tremors, particularly when tired or nervous. Tremors have improved with exercise. Recommended continuing exercises to strengthen her hip and overall body. Discussed that gradual increase in physical activity can help manage tremors.   - Continue prescribed exercises for hip and general strength   - Encourage gradual increase in physical activity      Status post right hip replacement Assessment & Plan: Recovering from a hip fracture and uses a walker for stability. Has exercises prescribed by her therapist and is encouraged to continue them to regain strength. Discussed that continuing these exercises and gradually increasing physical activity can help improve her condition.   - Continue prescribed hip exercises   - Use walker for stability when walking outside       Return in about 2 months (around 12/23/2023) for DM.      I discussed the assessment and treatment plan with the patient  The patient was provided an opportunity to ask questions and all were answered. The patient agreed with the plan and demonstrated an understanding of the instructions.   The patient was advised to call back or seek an in-person evaluation if the symptoms worsen or if the condition fails to improve as anticipated.    Carlean Charter, DO  [facility] [phone] (phone) [phone] (fax)  [facility]

## 2023-11-03 ENCOUNTER — Other Ambulatory Visit: Payer: Self-pay | Admitting: Family Medicine

## 2023-11-03 DIAGNOSIS — F339 Major depressive disorder, recurrent, unspecified: Secondary | ICD-10-CM

## 2023-11-04 NOTE — Telephone Encounter (Signed)
 Requested Prescriptions  Pending Prescriptions Disp Refills   FLUoxetine (PROZAC) 20 MG capsule [Pharmacy Med Name: FLUOXETINE HCL 20 MG CAP] 90 capsule 0    Sig: TAKE 1 CAPSULE BY MOUTH DAILY. TAKE WITHFLUOXETINE 10MG  DOSE     Psychiatry:  Antidepressants - SSRI Passed - 11/04/2023  9:40 AM      Passed - Completed PHQ-2 or PHQ-9 in the last 360 days      Passed - Valid encounter within last 6 months    Recent Outpatient Visits           2 months ago Annual physical exam   [facility] [place], Monico Blitz, DO   3 months ago Anxiety   [facility], Monico Blitz, DO   7 months ago S/P hip hemiarthroplasty   [facility] Alfredia Ferguson, PA-C   1 year ago Depression, recurrent [facility])   [facility] Alfredia Ferguson, PA-C   1 year ago Depression, recurrent [facility])   [facility] Alfredia Ferguson, PA-C       Future Appointments             In 1 month [place], Monico Blitz, DO  [facility]

## 2023-11-08 DIAGNOSIS — Z961 Presence of intraocular lens: Secondary | ICD-10-CM | POA: Diagnosis not present

## 2023-11-08 DIAGNOSIS — H524 Presbyopia: Secondary | ICD-10-CM | POA: Diagnosis not present

## 2023-11-08 DIAGNOSIS — H35429 Microcystoid degeneration of retina, unspecified eye: Secondary | ICD-10-CM | POA: Diagnosis not present

## 2023-11-08 DIAGNOSIS — H5203 Hypermetropia, bilateral: Secondary | ICD-10-CM | POA: Diagnosis not present

## 2023-11-08 DIAGNOSIS — H52223 Regular astigmatism, bilateral: Secondary | ICD-10-CM | POA: Diagnosis not present

## 2023-12-24 ENCOUNTER — Encounter: Payer: Self-pay | Admitting: Family Medicine

## 2023-12-24 ENCOUNTER — Ambulatory Visit (INDEPENDENT_AMBULATORY_CARE_PROVIDER_SITE_OTHER): Payer: Medicare Other | Admitting: Family Medicine

## 2023-12-24 VITALS — BP 131/54 | HR 82 | Ht 67.0 in | Wt 139.6 lb

## 2023-12-24 DIAGNOSIS — F5104 Psychophysiologic insomnia: Secondary | ICD-10-CM | POA: Diagnosis not present

## 2023-12-24 DIAGNOSIS — E7800 Pure hypercholesterolemia, unspecified: Secondary | ICD-10-CM

## 2023-12-24 DIAGNOSIS — J418 Mixed simple and mucopurulent chronic bronchitis: Secondary | ICD-10-CM

## 2023-12-24 DIAGNOSIS — F419 Anxiety disorder, unspecified: Secondary | ICD-10-CM

## 2023-12-24 DIAGNOSIS — F339 Major depressive disorder, recurrent, unspecified: Secondary | ICD-10-CM

## 2023-12-24 DIAGNOSIS — I10 Essential (primary) hypertension: Secondary | ICD-10-CM | POA: Diagnosis not present

## 2023-12-24 DIAGNOSIS — N1832 Chronic kidney disease, stage 3b: Secondary | ICD-10-CM

## 2023-12-24 MED ORDER — FLUOXETINE HCL 20 MG PO CAPS
20.0000 mg | ORAL_CAPSULE | Freq: Every day | ORAL | 3 refills | Status: AC
Start: 2023-12-24 — End: ?

## 2023-12-24 MED ORDER — FLUTICASONE FUROATE-VILANTEROL 100-25 MCG/ACT IN AEPB: 1.0000 | INHALATION_SPRAY | Freq: Every day | RESPIRATORY_TRACT | 6 refills | Status: DC

## 2023-12-24 MED ORDER — ALPRAZOLAM 0.5 MG PO TABS
ORAL_TABLET | ORAL | 1 refills | Status: DC
Start: 2023-12-24 — End: 2024-04-17

## 2023-12-24 NOTE — Patient Instructions (Signed)
 Recommended vaccines:  Tdap (tetanus, diphtheria and pertussis)  Shingrix (shingles)

## 2023-12-24 NOTE — Progress Notes (Signed)
 Established patient visit   Patient: Molly Leonard   DOB: 1946-08-01   78 y.o. Female  MRN: [id number] Visit Date: 12/24/2023  Today's healthcare provider: Sherlyn Hay, DO   Chief Complaint  Patient presents with   Follow-up    2 month follow up   Subjective    HPI Molly Leonard is a 78 year old female with chronic kidney failure who presents for medication management and follow-up.  She is managing multiple medications, including atorvastatin for cholesterol management. Initially, she experienced insomnia and severe headaches, which were later attributed to trazodone. She has since discontinued trazodone due to these adverse effects. She takes fluoxetine 30 mg daily, split into a 10 mg dose in the morning and a 20 mg dose at lunchtime, having previously found 40 mg too much. She also takes Xanax for anxiety, usually twice a day, and sometimes a half tablet if she feels particularly nervous. She has been trying to reduce her Xanax intake but has a stressful job. Additionally, she takes hydrochlorothiazide for blood pressure management and has been compliant with this medication. Folic acid was prescribed due to a folate deficiency identified in 2023.  She has chronic kidney failure and was supposed to follow up with her nephrologist four months after her last appointment in February 2024. She missed a previous appointment due to her hip surgery and hospitalization.  She underwent hip replacement surgery almost a year ago and was attending physical therapy until her insurance stopped covering it. She now manages her exercises independently and works every day, which involves a lot of physical activity, including walking and moving up and down frequently. 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  Her daughter passed away a year and a half ago, and she and her husband have been raising her daughter's two children. 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 She has been working seven days a week.     Medications: Outpatient Medications Prior to Visit  Medication Sig Note   atorvastatin (LIPITOR) 10 MG tablet Take 10 mg by mouth daily. 12/24/2023: Deleted original rx in error.  Prescribed by me.   FLUoxetine (PROZAC) 10 MG capsule Take 1 capsule (10 mg total) by mouth daily. Take with fluoxetine 20 mg dose    fluticasone (FLONASE) 50 MCG/ACT nasal spray Place 2 sprays into both nostrils daily.    folic acid (FOLVITE) 1 MG tablet TAKE ONE TABLET BY MOUTH DAILY.    hydrochlorothiazide (HYDRODIURIL) 12.5 MG tablet TAKE 1 TABLET BY MOUTH DAILY    [DISCONTINUED] ALPRAZolam (XANAX) 0.5 MG tablet Take 2 tablets in the afternoon, then take 1 before bed.      -  May repeat 1 tablet before bed after 1 hour if not effective.    [DISCONTINUED] atorvastatin (LIPITOR) 10 MG tablet Take 1 tablet (10 mg total) by mouth at bedtime.    [DISCONTINUED] FLUoxetine (PROZAC) 20 MG capsule TAKE 1 CAPSULE BY MOUTH DAILY. TAKE WITHFLUOXETINE 10MG  DOSE    [DISCONTINUED] fluticasone furoate-vilanterol (BREO ELLIPTA) 100-25 MCG/ACT AEPB Inhale 1 puff into the lungs daily.    [DISCONTINUED] traZODone (DESYREL) 50 MG tablet Take 0.5 tablets (25 mg total) by mouth at bedtime.    No facility-administered medications prior to visit.        Objective    BP (!) 131/54 (BP  Location: Right Arm, Patient Position: Sitting, Cuff Size: Normal)   Pulse 82   Ht 5\' 7"  (1.702 m)   Wt 139 lb 9.6 oz (63.3 kg)   SpO2 100%   BMI 21.86 kg/m     Physical Exam Vitals and nursing note reviewed.  Constitutional:      General: She is not in acute distress.    Appearance: Normal appearance.  HENT:     Head: Normocephalic and atraumatic.  Eyes:     General: No scleral icterus.    Conjunctiva/sclera: Conjunctivae normal.  Cardiovascular:     Rate and Rhythm: Normal rate.  Pulmonary:      Effort: Pulmonary effort is normal.  Neurological:     Mental Status: She is alert and oriented to person, place, and time. Mental status is at baseline.  Psychiatric:        Mood and Affect: Mood normal.        Behavior: Behavior normal.      No results found for any visits on 12/24/23.  Assessment & Plan    White coat syndrome with hypertension  Hypercholesterolemia  Anxiety -     ALPRAZolam; Take 2 tablets in the afternoon, then take 1 before bed.      -  May repeat 1 tablet before bed after 1 hour if not effective.  Dispense: 120 tablet; Refill: 1  Psychophysiological insomnia -     ALPRAZolam; Take 2 tablets in the afternoon, then take 1 before bed.      -  May repeat 1 tablet before bed after 1 hour if not effective.  Dispense: 120 tablet; Refill: 1  Depression, recurrent (HCC) -     FLUoxetine HCl; Take 1 capsule (20 mg total) by mouth daily. In morning (then take 10 mg capsule at lunch)  Dispense: 90 capsule; Refill: 3  Mixed simple and mucopurulent chronic bronchitis (HCC) -     Fluticasone Furoate-Vilanterol; Inhale 1 puff into the lungs daily.  Dispense: 28 each; Refill: 6  Stage 3b chronic kidney disease (CKD) (HCC)    Whitecoat syndrome with hypertension Chronic, stable.  Fairly well-controlled today. Continue with current management of hydrochlorothiazide 12.5 mg daily.  Anxiety; recurrent depression Anxiety related to work stress. Tapering Xanax to two doses per day. On fluoxetine 30 mg daily (20 mg in the morning and 10 mg at lunch), which is effective. - Prescribe Xanax with tapering instructions.  Patient aware to continue to try to taper to 1/day - Continue fluoxetine 30 mg daily.  Hypercholesterolemia On atorvastatin, taking it without problem. - Continue atorvastatin. - Monitor cholesterol levels (recheck at next visit), consider ezetimibe if atorvastatin is not tolerated or effective.  Chronic Kidney Disease, stage 3b Due for nephrology follow-up  post-hip surgery. - Schedule nephrology follow-up in June.  Insomnia Patient attempted to take trazodone and experienced headaches and insomnia.  She is not acutely concerned about her sleep patterns today and prefers to continue without additional medications at this time.  General Health Maintenance Tetanus and shingles vaccines recommended. Obtainable at pharmacy, not covered by Medicare in office. - Recommend tetanus and shingles vaccines at pharmacy.   Return in about 4 months (around 04/24/2024) for Anx/Dep; chronic f/u, HTN.      I discussed the assessment and treatment plan with the patient  The patient was provided an opportunity to ask questions and all were answered. The patient agreed with the plan and demonstrated an understanding of the instructions.   The patient was advised to  call back or seek an in-person evaluation if the symptoms worsen or if the condition fails to improve as anticipated.    Sherlyn Hay, DO  [facility] [phone] (phone) [phone] (fax)  [facility]

## 2024-01-03 ENCOUNTER — Other Ambulatory Visit: Payer: Self-pay | Admitting: Physician Assistant

## 2024-01-03 DIAGNOSIS — E538 Deficiency of other specified B group vitamins: Secondary | ICD-10-CM

## 2024-02-01 ENCOUNTER — Other Ambulatory Visit: Payer: Self-pay | Admitting: Family Medicine

## 2024-04-17 ENCOUNTER — Other Ambulatory Visit: Payer: Self-pay | Admitting: Family Medicine

## 2024-04-17 DIAGNOSIS — F419 Anxiety disorder, unspecified: Secondary | ICD-10-CM

## 2024-04-17 DIAGNOSIS — F5104 Psychophysiologic insomnia: Secondary | ICD-10-CM

## 2024-04-17 NOTE — Telephone Encounter (Signed)
 Pt requesting partial refill until the patient is able to come for next visit:    Copied from CRM #[id number]. Topic: Clinical - Medication Refill >> Apr 17, 2024  9:16 AM Tobias L wrote: Medication: ALPRAZolam  (XANAX ) 0.5 MG tablet Patient calling to confirm appointment for next week 04/24/24. Patient only has 2 tablets left and requesting refill until able to come in for appointment.   Has the patient contacted their pharmacy? Yes Told to contact the office.    This is the patient's preferred pharmacy:  [facility] - [place], [place] - [address] [place] [address] Phone: [phone] Fax: [phone]   Is this the correct pharmacy for this prescription? Yes   Has the prescription been filled recently? No   Is the patient out of the medication? No   Has the patient been seen for an appointment in the last year OR does the patient have an upcoming appointment? Yes   Can we respond through MyChart? No   Agent: Please be advised that Rx refills may take up to 3 business days. We ask that you follow-up with your pharmacy.

## 2024-04-17 NOTE — Telephone Encounter (Signed)
 Copied from CRM [id number]. Topic: Clinical - Medication Refill >> Apr 17, 2024  9:16 AM Tobias L wrote: Medication: ALPRAZolam  (XANAX ) 0.5 MG tablet Patient calling to confirm appointment for next week 04/24/24. Patient only has 2 tablets left and requesting refill until able to come in for appointment.  Has the patient contacted their pharmacy? Yes Told to contact the office.   This is the patient's preferred pharmacy:  [facility] - [place], [place] - [address] [place] [address] Phone: [phone] Fax: [phone]  Is this the correct pharmacy for this prescription? Yes  Has the prescription been filled recently? No  Is the patient out of the medication? No  Has the patient been seen for an appointment in the last year OR does the patient have an upcoming appointment? Yes  Can we respond through MyChart? No  Agent: Please be advised that Rx refills may take up to 3 business days. We ask that you follow-up with your pharmacy.

## 2024-04-18 ENCOUNTER — Other Ambulatory Visit: Payer: Self-pay | Admitting: Family Medicine

## 2024-04-18 DIAGNOSIS — F419 Anxiety disorder, unspecified: Secondary | ICD-10-CM

## 2024-04-18 DIAGNOSIS — F5104 Psychophysiologic insomnia: Secondary | ICD-10-CM

## 2024-04-18 NOTE — Telephone Encounter (Signed)
 Requested medication (s) are due for refill today: yes   Requested medication (s) are on the active medication list: yes   Last refill:  12/24/23   Future visit scheduled: yes   Notes to clinic:  Unable to refill per protocol, cannot delegate.

## 2024-04-18 NOTE — Telephone Encounter (Signed)
 Requested medication (s) are due for refill today: yes  Requested medication (s) are on the active medication list: yes  Last refill:  12/24/23  Future visit scheduled: yes  Notes to clinic:  Unable to refill per protocol, cannot delegate.        Requested Prescriptions  Pending Prescriptions Disp Refills   ALPRAZolam  (XANAX ) 0.5 MG tablet 120 tablet 1    Sig: Take 2 tablets in the afternoon, then take 1 before bed.      -  May repeat 1 tablet before bed after 1 hour if not effective.     Not Delegated - Psychiatry: Anxiolytics/Hypnotics 2 Failed - 04/18/2024 12:16 PM      Failed - This refill cannot be delegated      Failed - Urine Drug Screen completed in last 360 days      Passed - Patient is not pregnant      Passed - Valid encounter within last 6 months    Recent Outpatient Visits           3 months ago White coat syndrome with hypertension   [facility] [place], Lauraine SAILOR, DO   5 months ago Depression, recurrent [facility])   [facility] [place], Lauraine SAILOR, DO

## 2024-04-20 MED ORDER — ALPRAZOLAM 0.5 MG PO TABS
ORAL_TABLET | ORAL | 1 refills | Status: DC
Start: 2024-04-20 — End: 2024-07-21

## 2024-04-24 ENCOUNTER — Ambulatory Visit (INDEPENDENT_AMBULATORY_CARE_PROVIDER_SITE_OTHER): Admitting: Family Medicine

## 2024-04-24 ENCOUNTER — Encounter: Payer: Self-pay | Admitting: Family Medicine

## 2024-04-24 VITALS — BP 121/73 | HR 90 | Temp 97.7°F | Ht 67.0 in | Wt 137.9 lb

## 2024-04-24 DIAGNOSIS — F339 Major depressive disorder, recurrent, unspecified: Secondary | ICD-10-CM | POA: Diagnosis not present

## 2024-04-24 DIAGNOSIS — F419 Anxiety disorder, unspecified: Secondary | ICD-10-CM | POA: Diagnosis not present

## 2024-04-24 DIAGNOSIS — J418 Mixed simple and mucopurulent chronic bronchitis: Secondary | ICD-10-CM

## 2024-04-24 DIAGNOSIS — E7800 Pure hypercholesterolemia, unspecified: Secondary | ICD-10-CM

## 2024-04-24 DIAGNOSIS — K5904 Chronic idiopathic constipation: Secondary | ICD-10-CM | POA: Diagnosis not present

## 2024-04-24 DIAGNOSIS — I10 Essential (primary) hypertension: Secondary | ICD-10-CM

## 2024-04-24 MED ORDER — ATORVASTATIN CALCIUM 10 MG PO TABS
10.0000 mg | ORAL_TABLET | Freq: Every day | ORAL | 3 refills | Status: AC
Start: 2024-04-24 — End: ?

## 2024-04-24 MED ORDER — FLUTICASONE-SALMETEROL 250-50 MCG/ACT IN AEPB
1.0000 | INHALATION_SPRAY | Freq: Two times a day (BID) | RESPIRATORY_TRACT | 6 refills | Status: AC
Start: 2024-04-24 — End: ?

## 2024-04-24 NOTE — Progress Notes (Unsigned)
 Established patient visit   Patient: Molly Leonard   DOB: Dec 05, 1945   78 y.o. Female  MRN: [id number] Visit Date: 04/24/2024  Today's healthcare provider: LAURAINE LOISE BUOY, DO   Chief Complaint  Patient presents with   Medical Management of Chronic Issues    Follow up on anxiety depression and htn. Patient refused completion of PHQ 9and GAD 7. No other concerns today, not checking bp at home. No symptoms suc as dizziness headache or CP/ SOB   Subjective    HPI Molly Leonard is a 78 year old female who presents for a follow-up visit regarding her blood pressure and anxiety management. She is accompanied by her husband.  Her blood pressure is currently 121/73 mmHg, which she notes is an improvement as it is usually higher during visits.  She is taking alprazolam , 1 mg twice a day, for anxiety, having reduced from a previous dosage of 1 mg four times a day. She experiences 'shaking' and 'stumping' but states 'I'll live.' She also takes fluoxetine  30 mg daily and feels stable on this medication.  She has a history of chronic constipation and uses Senokot as needed, having taken one dose recently and planning to take another soon. Her daughter experiences similar issues.  She is taking atorvastatin  for cholesterol management without issues, with her husband reminding her to take it every night.  She has resumed driving after previously relying on her husband for transportation due to a hip fracture. She schedules her appointments in the morning to accommodate her driving.  She uses Breo Ellipta  inhaler as needed but finds it expensive. She has previously used Symbicort  and is considering more affordable options. She reports no trouble with climbing stairs.  No chest pain, shortness of breath, numbness, tingling, headaches, dizziness, lightheadedness, nausea, vomiting, or belly pain. Occasional sneezing attributed to weather or allergies.   ***Statin intolerance  {History  (Optional):23778}  Medications: Outpatient Medications Prior to Visit  Medication Sig   ALPRAZolam  (XANAX ) 0.5 MG tablet Take 2 tablets in the afternoon, then take 1 before bed.      -  May repeat 1 tablet before bed after 1 hour if not effective.   atorvastatin  (LIPITOR) 10 MG tablet Take 10 mg by mouth daily.   FLUoxetine  (PROZAC ) 10 MG capsule Take 1 capsule (10 mg total) by mouth daily. Take with fluoxetine  20 mg dose   FLUoxetine  (PROZAC ) 20 MG capsule Take 1 capsule (20 mg total) by mouth daily. In morning (then take 10 mg capsule at lunch)   fluticasone  (FLONASE ) 50 MCG/ACT nasal spray Place 2 sprays into both nostrils daily.   fluticasone  furoate-vilanterol (BREO ELLIPTA ) 100-25 MCG/ACT AEPB Inhale 1 puff into the lungs daily.   folic acid  (FOLVITE ) 1 MG tablet TAKE 1 TABLET BY MOUTH DAILY   hydrochlorothiazide  (HYDRODIURIL ) 12.5 MG tablet TAKE 1 TABLET BY MOUTH DAILY   No facility-administered medications prior to visit.    Review of Systems  Constitutional:  Negative for appetite change, chills, fatigue and fever.  Respiratory:  Negative for chest tightness and shortness of breath.   Cardiovascular:  Negative for chest pain and palpitations.  Gastrointestinal:  Negative for abdominal pain, nausea and vomiting.  Neurological:  Negative for dizziness and weakness.    {Insert previous labs (optional):23779} {See past labs  Heme  Chem  Endocrine  Serology  Results Review (optional):1}   Objective    BP 121/73 (BP Location: Right Arm, Patient Position: Sitting, Cuff Size: Normal)  Pulse 90   Temp 97.7 F (36.5 C) (Oral)   Ht 5' 7 (1.702 m)   Wt 137 lb 14.4 oz (62.6 kg)   SpO2 100%   BMI 21.60 kg/m  {Insert last BP/Wt (optional):23777}{See vitals history (optional):1}   Physical Exam Constitutional:      Appearance: Normal appearance.  HENT:     Head: Normocephalic and atraumatic.  Eyes:     General: No scleral icterus.    Extraocular Movements: Extraocular  movements intact.     Conjunctiva/sclera: Conjunctivae normal.  Cardiovascular:     Rate and Rhythm: Normal rate and regular rhythm.     Pulses: Normal pulses.     Heart sounds: Normal heart sounds.  Pulmonary:     Effort: Pulmonary effort is normal. No respiratory distress.     Breath sounds: Normal breath sounds.  Abdominal:     General: Bowel sounds are normal. There is no distension.     Palpations: Abdomen is soft. There is no mass.     Tenderness: There is no abdominal tenderness. There is no guarding.  Musculoskeletal:     Right lower leg: No edema.     Left lower leg: No edema.  Skin:    General: Skin is warm and dry.  Neurological:     Mental Status: She is alert and oriented to person, place, and time. Mental status is at baseline.  Psychiatric:        Mood and Affect: Mood normal.        Behavior: Behavior normal.      No results found for any visits on 04/24/24.  Assessment & Plan    Anxiety  Depression, recurrent (HCC)  Hypercholesterolemia  White coat syndrome with hypertension     Generalized anxiety disorder Managed with alprazolam , reduced to 2 mg per day. Reports feeling shaky but managing. - Continue alprazolam  2 mg per day.  Major depressive disorder Managed with fluoxetine  30 mg per day. - Continue fluoxetine  30 mg per day.  Chronic obstructive pulmonary disease (COPD) Managed with Wixela (generic Symbicort ) due to cost concerns with Breo Ellipta . Reports no significant respiratory symptoms. - Prescribe Wixela (generic Symbicort ) twice daily. - Instruct to consult pharmacist for cost-effective alternatives if Napoleon is too expensive.  Hyperlipidemia Managed with atorvastatin , well tolerated and taken regularly. - Continue atorvastatin  as prescribed. - Refill atorvastatin  prescription.  Constipation Chronic issue managed with Senokot as needed. Aware of bowel obstruction risks. - Continue Senokot as needed for constipation. ***  No  follow-ups on file.      I discussed the assessment and treatment plan with the patient  The patient was provided an opportunity to ask questions and all were answered. The patient agreed with the plan and demonstrated an understanding of the instructions.   The patient was advised to call back or seek an in-person evaluation if the symptoms worsen or if the condition fails to improve as anticipated.    LAURAINE LOISE BUOY, DO  [facility] [phone] (phone) [phone] (fax)  [facility]

## 2024-07-21 ENCOUNTER — Encounter: Payer: Self-pay | Admitting: Family Medicine

## 2024-07-21 ENCOUNTER — Ambulatory Visit (INDEPENDENT_AMBULATORY_CARE_PROVIDER_SITE_OTHER): Admitting: Family Medicine

## 2024-07-21 VITALS — BP 132/69 | HR 89 | Temp 98.0°F | Ht 67.0 in | Wt 136.7 lb

## 2024-07-21 DIAGNOSIS — E7800 Pure hypercholesterolemia, unspecified: Secondary | ICD-10-CM

## 2024-07-21 DIAGNOSIS — D539 Nutritional anemia, unspecified: Secondary | ICD-10-CM

## 2024-07-21 DIAGNOSIS — F5104 Psychophysiologic insomnia: Secondary | ICD-10-CM | POA: Diagnosis not present

## 2024-07-21 DIAGNOSIS — B370 Candidal stomatitis: Secondary | ICD-10-CM

## 2024-07-21 DIAGNOSIS — Z23 Encounter for immunization: Secondary | ICD-10-CM

## 2024-07-21 DIAGNOSIS — F419 Anxiety disorder, unspecified: Secondary | ICD-10-CM

## 2024-07-21 DIAGNOSIS — F339 Major depressive disorder, recurrent, unspecified: Secondary | ICD-10-CM

## 2024-07-21 DIAGNOSIS — J418 Mixed simple and mucopurulent chronic bronchitis: Secondary | ICD-10-CM | POA: Diagnosis not present

## 2024-07-21 DIAGNOSIS — N1832 Chronic kidney disease, stage 3b: Secondary | ICD-10-CM

## 2024-07-21 DIAGNOSIS — E538 Deficiency of other specified B group vitamins: Secondary | ICD-10-CM

## 2024-07-21 MED ORDER — ZOSTER VAC RECOMB ADJUVANTED 50 MCG/0.5ML IM SUSR
0.5000 mL | INTRAMUSCULAR | 1 refills | Status: AC
Start: 2024-07-21 — End: ?

## 2024-07-21 MED ORDER — FLUOXETINE HCL 10 MG PO CAPS
10.0000 mg | ORAL_CAPSULE | Freq: Every day | ORAL | 2 refills | Status: AC
Start: 2024-08-14 — End: ?

## 2024-07-21 MED ORDER — TETANUS-DIPHTH-ACELL PERTUSSIS 5-2-15.5 LF-MCG/0.5 IM SUSP
0.5000 mL | Freq: Once | INTRAMUSCULAR | 0 refills | Status: AC
Start: 2024-07-21 — End: 2024-07-21

## 2024-07-21 MED ORDER — ALPRAZOLAM 0.5 MG PO TABS
ORAL_TABLET | ORAL | 1 refills | Status: AC
Start: 2024-07-21 — End: ?

## 2024-07-21 MED ORDER — CLOTRIMAZOLE 10 MG MT TROC
10.0000 mg | Freq: Every day | OROMUCOSAL | 0 refills | Status: AC
Start: 2024-07-21 — End: ?

## 2024-07-21 NOTE — Progress Notes (Signed)
 Established patient visit   Patient: Molly Leonard   DOB: [birth year]   78 y.o. Female  MRN: [id number] Visit Date: 07/21/2024  Today's healthcare provider: LAURAINE LOISE BUOY, DO   Chief Complaint  Patient presents with   Medical Management of Chronic Issues    Patient is here today for a 3 month follow up anxiety and depression stated that it is okay.  Expresses no concerns.   Subjective    HPI Molly Leonard is a 78 year old female with anxiety and depression who presents for medication management and vaccination updates.  She has been experiencing ongoing struggles with anxiety and depression, particularly following the passing of her daughter. Her anxiety is described as 'awful,' with sensations of feeling 'shaky inside.' She is currently taking Xanax , 0.5 mg daily, with occasional increases to 1 mg when feeling particularly anxious. She has been reducing her Xanax  use over time. She also takes fluoxetine , 30 mg daily, which she believes helps manage her symptoms, although she notes an increase in appetite as a side effect.  She inquires about receiving a shingles vaccine and a tetanus booster, as her husband recently received a prescription for these. She expresses concern about the potential side effects of the shingles vaccine, mentioning a family acquaintance who experienced severe complications from shingles. She mentions a past experience with shingles, having had a rash on her neck and ear.  She is concerned about the possibility of developing thrush from her inhaler use and requests a prescription for thrush medication as a precaution.  She has not had a mammogram since her daughter was diagnosed with cancer. She is not currently taking calcium  or vitamin D supplements.  She has been working from home and has not driven in a year due to a previous heel fracture. She recently drove herself to the appointment, which made her nervous.       Medications: Outpatient  Medications Prior to Visit  Medication Sig   atorvastatin  (LIPITOR) 10 MG tablet Take 1 tablet (10 mg total) by mouth at bedtime.   FLUoxetine  (PROZAC ) 20 MG capsule Take 1 capsule (20 mg total) by mouth daily. In morning (then take 10 mg capsule at lunch)   fluticasone  (FLONASE ) 50 MCG/ACT nasal spray Place 2 sprays into both nostrils daily.   fluticasone -salmeterol (WIXELA INHUB) 250-50 MCG/ACT AEPB Inhale 1 puff into the lungs in the morning and at bedtime.   folic acid  (FOLVITE ) 1 MG tablet TAKE 1 TABLET BY MOUTH DAILY   hydrochlorothiazide  (HYDRODIURIL ) 12.5 MG tablet TAKE 1 TABLET BY MOUTH DAILY   senna (SENOKOT) 8.6 MG tablet Take 1 tablet by mouth daily as needed for constipation.   [DISCONTINUED] ALPRAZolam  (XANAX ) 0.5 MG tablet Take 2 tablets in the afternoon, then take 1 before bed.      -  May repeat 1 tablet before bed after 1 hour if not effective.   [DISCONTINUED] FLUoxetine  (PROZAC ) 10 MG capsule Take 1 capsule (10 mg total) by mouth daily. Take with fluoxetine  20 mg dose   No facility-administered medications prior to visit.        Objective    BP 132/69 (BP Location: Right Arm, Patient Position: Sitting, Cuff Size: Normal)   Pulse 89   Temp 98 F (36.7 C) (Oral)   Ht 5' 7 (1.702 m)   Wt 136 lb 11.2 oz (62 kg)   SpO2 99%   BMI 21.41 kg/m     Physical Exam Vitals and nursing note  reviewed.  Constitutional:      General: She is not in acute distress.    Appearance: Normal appearance.  HENT:     Head: Normocephalic and atraumatic.  Eyes:     General: No scleral icterus.    Conjunctiva/sclera: Conjunctivae normal.  Cardiovascular:     Rate and Rhythm: Normal rate.  Pulmonary:     Effort: Pulmonary effort is normal.  Neurological:     Mental Status: She is alert and oriented to person, place, and time. Mental status is at baseline.  Psychiatric:        Mood and Affect: Mood normal.        Behavior: Behavior normal.      No results found for any visits  on 07/21/24.  Assessment & Plan    Depression, recurrent -     FLUoxetine  HCl; Take 1 capsule (10 mg total) by mouth daily. Take with fluoxetine  20 mg dose  Dispense: 90 capsule; Refill: 2  Anxiety -     ALPRAZolam ; Take 2 tablets in the afternoon, then take 1 before bed.      -  May repeat 1 tablet before bed after 1 hour if not effective.  Dispense: 120 tablet; Refill: 1  Psychophysiological insomnia -     ALPRAZolam ; Take 2 tablets in the afternoon, then take 1 before bed.      -  May repeat 1 tablet before bed after 1 hour if not effective.  Dispense: 120 tablet; Refill: 1  Mixed simple and mucopurulent chronic bronchitis (HCC)  Thrush, oral -     Clotrimazole; Take 1 tablet (10 mg total) by mouth 5 (five) times daily.  Dispense: 70 Troche; Refill: 0  Macrocytic anemia -     CBC  Vitamin B12 deficiency -     B12 and Folate Panel  Folate deficiency -     B12 and Folate Panel  Hypercholesterolemia -     Lipid panel  Stage 3b chronic kidney disease (CKD) (HCC) -     Microalbumin / creatinine urine ratio -     Comprehensive metabolic panel with GFR -     VITAMIN D 25 Hydroxy (Vit-D Deficiency, Fractures)  Need for Tdap vaccination -     Tetanus-Diphth-Acell Pertussis; Inject 0.5 mLs into the muscle once for 1 dose.  Dispense: 0.5 mL; Refill: 0  Need for shingles vaccine -     Zoster Vac Recomb Adjuvanted; Inject 0.5 mLs into the muscle as directed for 2 doses.  Dispense: 0.5 mL; Refill: 1      Depression, recurrent; anxiety; psychophysiological insomnia Fluoxetine  taken for depression and anxiety. Reduced Xanax  usage. - Continue fluoxetine  30 mg daily. - Continue Xanax  as needed, current supply sufficient for a couple of weeks. - Scheduled follow-up every three months.  Mixed simple and mucopurulent chronic bronchitis Poor adherence to inhalers due to concern for thrush.  Counseled patient on adherence  Oral thrush Requested prescription for thrush medication due  to concerns affecting inhaler use. - Prescribed clotrimazole for oral thrush to facilitate adherence to inhaler. - Advised to start medication if thrush develops and to scrape white patches to confirm fungal origin. - Instructed to return if symptoms do not improve.  Macrocytic anemia; vitamin B12 deficiency; folate deficiency Macrocytic anemia noted in prior blood work; vitamin B12 and folate levels also noted to be low. Not currently taking supplements. - Check CBC, folate and vitamin B12 levels. - Recommended vitamin B12 and folate supplementation if levels are low.  Hypercholesterolemia  Chronic, managed with atorvastatin  10 mg daily.  Will recheck lipid panel today.  General Health Maintenance Due for TDAP and Shingrix vaccinations, bone density study. Not taking calcium  or vitamin D supplements. - Administered TDAP and Shingrix vaccinations. - Recommended calcium  1200 mg daily and vitamin D 2000 units daily. - Scheduled bone density study.  Stage 3b chronic kidney disease Noted.  No acute concerns.  Recheck metabolic panel and uACR today.  Continue to optimize risk factors.    Return in about 3 months (around 10/21/2024) for Chronic f/u, Anx/Dep.      I discussed the assessment and treatment plan with the patient  The patient was provided an opportunity to ask questions and all were answered. The patient agreed with the plan and demonstrated an understanding of the instructions.   The patient was advised to call back or seek an in-person evaluation if the symptoms worsen or if the condition fails to improve as anticipated.    LAURAINE LOISE BUOY, DO  [facility] [phone] (phone) [phone] (fax)  [facility]

## 2024-07-21 NOTE — Patient Instructions (Signed)
 Recommend taking Calcium  1200 mg daily and vitamin D 2000 units daily

## 2024-09-04 ENCOUNTER — Other Ambulatory Visit: Payer: Self-pay | Admitting: Family Medicine

## 2024-10-24 ENCOUNTER — Ambulatory Visit: Admitting: Family Medicine
